# Patient Record
Sex: Male | Born: 1960 | Race: Black or African American | Hispanic: No | Marital: Married | Smoking: Former smoker
Health system: Southern US, Community
[De-identification: ages and names within clinical notes are randomized; demographics above are authoritative.]

## PROBLEM LIST (undated history)

## (undated) DIAGNOSIS — K219 Gastro-esophageal reflux disease without esophagitis: Secondary | ICD-10-CM

## (undated) HISTORY — PX: LEG SURGERY: SHX1003

---

## 1998-05-22 ENCOUNTER — Ambulatory Visit (HOSPITAL_COMMUNITY): Admission: RE | Admit: 1998-05-22 | Discharge: 1998-05-22 | Payer: Self-pay

## 2020-01-03 ENCOUNTER — Other Ambulatory Visit: Payer: Self-pay

## 2020-01-03 ENCOUNTER — Encounter (HOSPITAL_COMMUNITY): Payer: Self-pay | Admitting: Emergency Medicine

## 2020-01-03 ENCOUNTER — Emergency Department (HOSPITAL_COMMUNITY)
Admission: EM | Admit: 2020-01-03 | Discharge: 2020-01-03 | Disposition: A | Discharge status: Home / Self Care | Payer: BC Managed Care – PPO | Attending: Emergency Medicine | Admitting: Emergency Medicine

## 2020-01-03 DIAGNOSIS — K4091 Unilateral inguinal hernia, without obstruction or gangrene, recurrent: Secondary | ICD-10-CM | POA: Diagnosis not present

## 2020-01-03 DIAGNOSIS — K4090 Unilateral inguinal hernia, without obstruction or gangrene, not specified as recurrent: Secondary | ICD-10-CM

## 2020-01-03 DIAGNOSIS — F17210 Nicotine dependence, cigarettes, uncomplicated: Secondary | ICD-10-CM | POA: Diagnosis not present

## 2020-01-03 LAB — COMPREHENSIVE METABOLIC PANEL
ALT: 14 U/L (ref 0–44)
AST: 19 U/L (ref 15–41)
Albumin: 3.8 g/dL (ref 3.5–5.0)
Alkaline Phosphatase: 46 U/L (ref 38–126)
Anion gap: 12 (ref 5–15)
BUN: 13 mg/dL (ref 6–20)
CO2: 22 mmol/L (ref 22–32)
Calcium: 9.4 mg/dL (ref 8.9–10.3)
Chloride: 106 mmol/L (ref 98–111)
Creatinine, Ser: 1.02 mg/dL (ref 0.61–1.24)
GFR calc Af Amer: >60 mL/min (ref >60)
GFR calc non Af Amer: >60 mL/min (ref >60)
Glucose, Bld: 74 mg/dL (ref 70–99)
Potassium: 4 mmol/L (ref 3.5–5.1)
Sodium: 140 mmol/L (ref 135–145)
Total Bilirubin: 1.2 mg/dL (ref 0.3–1.2)
Total Protein: 7.2 g/dL (ref 6.5–8.1)

## 2020-01-03 LAB — CBC WITH DIFFERENTIAL/PLATELET
Abs Immature Granulocytes: 0.01 10*3/uL (ref 0.00–0.07)
Basophils Absolute: 0 10*3/uL (ref 0.0–0.1)
Basophils Relative: 0 %
Eosinophils Absolute: 0 10*3/uL (ref 0.0–0.5)
Eosinophils Relative: 0 %
HCT: 49.7 % (ref 39.0–52.0)
Hemoglobin: 16.4 g/dL (ref 13.0–17.0)
Immature Granulocytes: 0 %
Lymphocytes Relative: 21 %
Lymphs Abs: 1.4 10*3/uL (ref 0.7–4.0)
MCH: 30.9 pg (ref 26.0–34.0)
MCHC: 33 g/dL (ref 30.0–36.0)
MCV: 93.6 fL (ref 80.0–100.0)
Monocytes Absolute: 0.5 10*3/uL (ref 0.1–1.0)
Monocytes Relative: 7 %
Neutro Abs: 4.6 10*3/uL (ref 1.7–7.7)
Neutrophils Relative %: 72 %
Platelets: 340 10*3/uL (ref 150–400)
RBC: 5.31 MIL/uL (ref 4.22–5.81)
RDW: 12.8 % (ref 11.5–15.5)
WBC: 6.5 10*3/uL (ref 4.0–10.5)
nRBC: 0 % (ref 0.0–0.2)

## 2020-01-03 LAB — LACTIC ACID, PLASMA: Lactic Acid, Venous: 1 mmol/L (ref 0.5–1.9)

## 2020-01-03 NOTE — Discharge Instructions (Signed)
Your laboratory results are within normal limits.  I have provided the number to [facility], you will need to schedule an appointment with them in order to obtain further management of your right inguinal hernia.

## 2020-01-03 NOTE — ED Triage Notes (Signed)
Patient c/o hernia to right groin that began protruding sometime today. States he has had hernia for about a year and at night when he lays down it goes flat. Today pain is causing him to not want to eat.

## 2020-01-03 NOTE — ED Provider Notes (Signed)
[facility] EMERGENCY DEPARTMENT Provider Note   CSN: [id number] Arrival date & time: 01/03/20  1741     History Chief Complaint  Patient presents with  . Hernia    Joshua Harmon is a 59 y.o. male.  59 y.o male with a PMH of Inguinal hernia presents to the ED with a chief complaint of right inguinal hernia for the past year.  Patient reports he has had a hernia present for the past year, states he felt like tonight he needed to "get this taken care of ".  He describes a fullness to the right lower 10 region, states this is usually flat during the day but does come swollen throughout the day.  He reports the hernia is mostly absent in the mornings.  He has not gone this evaluated.  He also reports some fullness to his lower abdomen. He has been drinking and eating adequally. He denies any nausea, vomiting, fevers or other complaints.    The history is provided by the patient.       History reviewed. No pertinent past medical history.  There are no problems to display for this patient.   History reviewed. No pertinent surgical history.     No family history on file.  Social History   Tobacco Use  . Smoking status: Current Every Day Smoker  . Smokeless tobacco: Never Used  Substance Use Topics  . Alcohol use: Yes  . Drug use: Yes    Types: Marijuana    Home Medications Prior to Admission medications   Not on File    Allergies    Patient has no known allergies.  Review of Systems   Review of Systems  Constitutional: Negative for chills and fever.  HENT: Negative for ear pain and sore throat.   Eyes: Negative for pain and visual disturbance.  Respiratory: Negative for cough and shortness of breath.   Cardiovascular: Negative for chest pain and palpitations.  Gastrointestinal: Negative for abdominal pain and vomiting.  Genitourinary: Negative for dysuria and hematuria.  Musculoskeletal: Negative for arthralgias and back pain.  Skin:  Negative for color change and rash.  Neurological: Negative for seizures and syncope.  All other systems reviewed and are negative.   Physical Exam Updated Vital Signs BP (!) 123/91 (BP Location: Right Arm)   Pulse 80   Temp 98.7 F (37.1 C) (Oral)   Resp 16   SpO2 98%   Physical Exam Vitals and nursing note reviewed.  Constitutional:      Appearance: He is well-developed. He is not ill-appearing.  HENT:     Head: Normocephalic and atraumatic.  Eyes:     General: No scleral icterus.    Pupils: Pupils are equal, round, and reactive to light.  Cardiovascular:     Heart sounds: Normal heart sounds.  Pulmonary:     Effort: Pulmonary effort is normal.     Breath sounds: Normal breath sounds. No wheezing.  Chest:     Chest wall: No tenderness.  Abdominal:     General: Bowel sounds are normal. There is no distension.     Palpations: Abdomen is soft.     Tenderness: There is no abdominal tenderness.     Hernia: A hernia is present. Hernia is present in the right inguinal area.     Comments: Non incarcerated, non tender, reducible hernia noted. Abdomen is soft, non tender to palpation.   Musculoskeletal:        General: No tenderness or deformity.  Cervical back: Normal range of motion.  Skin:    General: Skin is warm and dry.  Neurological:     Mental Status: He is alert and oriented to person, place, and time.     ED Results / Procedures / Treatments   Labs (all labs ordered are listed, but only abnormal results are displayed) Labs Reviewed  CBC WITH DIFFERENTIAL/PLATELET  COMPREHENSIVE METABOLIC PANEL  LACTIC ACID, PLASMA    EKG None  Radiology No results found.  Procedures Procedures (including critical care time)  Medications Ordered in ED Medications - No data to display  ED Course  I have reviewed the triage vital signs and the nursing notes.  Pertinent labs & imaging results that were available during my care of the patient were reviewed by me  and considered in my medical decision making (see chart for details).    MDM Rules/Calculators/A&P   With a past medical history of right inguinal hernia presents to the ED with complaints of right inguinal hernia discomfort.  He has had this hernia present for about a year.  He has had no fevers, nausea, vomiting, drinking and eating adequately.  Vitals are within normal limits, he is afebrile.  He reports he felt that "today was a day to have this taken care of ".  He has had no increase in pain, is very well-appearing on my exam, no signs of infection.   Evaluation large reducible nonincarcerated nonstrangulated hernia noted on my exam, patient was placed in Trendelenburg, ice was placed to the area, I was able to reduce this hernia with success.  He does report in the morning, hernia seems to be somewhat flat, this seems to increase in swelling at night.  I have obtained labs, these were interpreted by me, CMP without any electrolyte abnormality, CBC without any leukocytosis.  Lactic acid is negative.  No signs of ischemia.  Will bypass imaging at this time.  I have discussed this case with my attending Dr. Francia Greaves who has also seen and evaluated patient and agrees with management.  Patient will be provided with referral to [facility] surgery in order to obtain outpatient treatment.  Patient understands and agrees with management.  Return precautions discussed at length.    Portions of this note were generated with Lobbyist. Dictation errors may occur despite best attempts at proofreading.  Final Clinical Impression(s) / ED Diagnoses Final diagnoses:  Reducible inguinal hernia    Rx / DC Orders ED Discharge Orders    None       Janeece Fitting, Hershal Coria 01/03/20 2350    Valarie Merino, MD 01/04/20 1355

## 2020-01-04 ENCOUNTER — Ambulatory Visit: Payer: Self-pay | Admitting: Surgery

## 2020-01-04 NOTE — H&P (Signed)
Surgical H&P  Chief Complaint: hernia  HPI: 59 year old man presents with inguinal hernia. This has been present for about a year. He presented to the emergency room yesterday noting more difficulty in reducing it, and they were able to do so. He experiences fullness in the right groin which worsens throughout the day, typically in the mornings there is no swelling present. Denies any GI symptoms or bladder symptoms. No prior surgeries for this.  Denies any prior surgeries or medical problems. Takes no medications. Does not really go to doctors.  Had a CMP and CBC in the emergency room yesterday which were all normal. Current every day smoker.  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  No Known Allergies  No past medical history on file.  No past surgical history on file.  No family history on file.  Social History   Socioeconomic History  . Marital status: Single    Spouse name: Not on file  . Number of children: Not on file  . Years of education: Not on file  . Highest education level: Not on file  Occupational History  . Not on file  Tobacco Use  . Smoking status: Current Every Day Smoker  . Smokeless tobacco: Never Used  Substance and Sexual Activity  . Alcohol use: Yes  . Drug use: Yes    Types: Marijuana  . Sexual activity: Not on file  Other Topics Concern  . Not on file  Social History Narrative  . Not on file   Social Determinants of Health   Financial Resource Strain:   . Difficulty of Paying Living Expenses: Not on file  Food Insecurity:   . Worried About Programme researcher, broadcasting/film/video in the Last Year: Not on file  . Ran Out of Food in the Last Year: Not on file  Transportation Needs:   . Lack of Transportation (Medical): Not on file  . Lack of Transportation (Non-Medical): Not on file  Physical Activity:   . Days of Exercise per Week: Not on file  . Minutes of Exercise per Session: Not on file  Stress:   . Feeling of Stress  : Not on file  Social Connections:   . Frequency of Communication with Friends and Family: Not on file  . Frequency of Social Gatherings with Friends and Family: Not on file  . Attends Religious Services: Not on file  . Active Member of Clubs or Organizations: Not on file  . Attends Banker Meetings: Not on file  . Marital Status: Not on file    No current outpatient medications on file prior to visit.   No current facility-administered medications on file prior to visit.    Review of Systems: a complete, 10pt review of systems was completed with pertinent positives and negatives as documented in the HPI  Physical Exam: Vitals  Weight: 143.25 lb Height: 73in Body Surface Area: 1.87 m Body Mass Index: 18.9 kg/m  Temp.: 97.36F  Pulse: 120 (Regular)  P.OX: 95% (Room air) BP: 124/82 (Sitting, Left Arm, Standard)  Alert, well-appearing Unlabored respirations Abdomen soft nontender. Reducible large right inguinal hernia. No hernia in the left.   CBC Latest Ref Rng & Units 01/03/2020  WBC 4.0 - 10.5 K/uL 6.5  Hemoglobin 13.0 - 17.0 g/dL 24.2  Hematocrit 35.3 - 52.0 % 49.7  Platelets 150 - 400 K/uL 340    CMP Latest Ref Rng & Units 01/03/2020  Glucose 70 - 99 mg/dL 74  BUN 6 -  20 mg/dL 13  Creatinine 0.61 - 1.24 mg/dL 1.02  Sodium 135 - 145 mmol/L 140  Potassium 3.5 - 5.1 mmol/L 4.0  Chloride 98 - 111 mmol/L 106  CO2 22 - 32 mmol/L 22  Calcium 8.9 - 10.3 mg/dL 9.4  Total Protein 6.5 - 8.1 g/dL 7.2  Total Bilirubin 0.3 - 1.2 mg/dL 1.2  Alkaline Phos 38 - 126 U/L 46  AST 15 - 41 U/L 19  ALT 0 - 44 U/L 14    No results found for: INR, PROTIME  Imaging: No results found.   A/P: INGUINAL HERNIA, RIGHT (K40.90) Story: Reducible but large hernia, increasingly symptomatic with recent episode of transient incarceration. I recommended proceeding with open repair. Discussed the procedure including technical details, risks of bleeding, infection, pain,  scarring, injury to intra-abdominal or retroperitoneal structures including bowel, bladder, blood vessels, nerves etc. Discussed risk of chronic groin pain or numbness, problems with wound healing, seroma/hematoma, discussed use of mesh. Discussed that this requires general anesthesia which carries some risk of cardiovascular, thromboembolic or pulmonary complications and we went over those. Discussed risk of hernia recurrence which is increased by his tobacco abuse. Discussed recommended 6 weeks of no heavy lifting or strenuous exercise to maximize healing and scar tissue formation and reduce risk of recurrence. Questions were welcomed and answered to his satisfaction. We'll proceed with scheduling.  There are no problems to display for this patient.      Romana Juniper, MD [facility], [place]  See AMION to contact appropriate on-call provider

## 2020-01-29 ENCOUNTER — Emergency Department: Payer: BC Managed Care – PPO

## 2020-01-29 ENCOUNTER — Other Ambulatory Visit: Payer: Self-pay

## 2020-01-29 ENCOUNTER — Emergency Department
Admission: EM | Admit: 2020-01-29 | Discharge: 2020-01-29 | Disposition: A | Discharge status: Home / Self Care | Payer: BC Managed Care – PPO | Attending: Student in an Organized Health Care Education/Training Program | Admitting: Student in an Organized Health Care Education/Training Program

## 2020-01-29 ENCOUNTER — Encounter: Payer: Self-pay | Admitting: Emergency Medicine

## 2020-01-29 DIAGNOSIS — N5089 Other specified disorders of the male genital organs: Secondary | ICD-10-CM

## 2020-01-29 DIAGNOSIS — R1909 Other intra-abdominal and pelvic swelling, mass and lump: Secondary | ICD-10-CM | POA: Diagnosis not present

## 2020-01-29 DIAGNOSIS — K4090 Unilateral inguinal hernia, without obstruction or gangrene, not specified as recurrent: Secondary | ICD-10-CM | POA: Diagnosis not present

## 2020-01-29 DIAGNOSIS — R1031 Right lower quadrant pain: Secondary | ICD-10-CM

## 2020-01-29 DIAGNOSIS — F17210 Nicotine dependence, cigarettes, uncomplicated: Secondary | ICD-10-CM | POA: Diagnosis not present

## 2020-01-29 DIAGNOSIS — F1210 Cannabis abuse, uncomplicated: Secondary | ICD-10-CM | POA: Diagnosis not present

## 2020-01-29 LAB — CBC WITH DIFFERENTIAL/PLATELET
Abs Immature Granulocytes: 0.02 10*3/uL (ref 0.00–0.07)
Basophils Absolute: 0 10*3/uL (ref 0.0–0.1)
Basophils Relative: 1 %
Eosinophils Absolute: 0 10*3/uL (ref 0.0–0.5)
Eosinophils Relative: 1 %
HCT: 49.6 % (ref 39.0–52.0)
Hemoglobin: 16.5 g/dL (ref 13.0–17.0)
Immature Granulocytes: 0 %
Lymphocytes Relative: 27 %
Lymphs Abs: 1.8 10*3/uL (ref 0.7–4.0)
MCH: 31.1 pg (ref 26.0–34.0)
MCHC: 33.3 g/dL (ref 30.0–36.0)
MCV: 93.4 fL (ref 80.0–100.0)
Monocytes Absolute: 0.6 10*3/uL (ref 0.1–1.0)
Monocytes Relative: 8 %
Neutro Abs: 4.1 10*3/uL (ref 1.7–7.7)
Neutrophils Relative %: 63 %
Platelets: 300 10*3/uL (ref 150–400)
RBC: 5.31 MIL/uL (ref 4.22–5.81)
RDW: 12.8 % (ref 11.5–15.5)
WBC: 6.5 10*3/uL (ref 4.0–10.5)
nRBC: 0 % (ref 0.0–0.2)

## 2020-01-29 LAB — COMPREHENSIVE METABOLIC PANEL
ALT: 13 U/L (ref 0–44)
AST: 15 U/L (ref 15–41)
Albumin: 4.5 g/dL (ref 3.5–5.0)
Alkaline Phosphatase: 44 U/L (ref 38–126)
Anion gap: 11 (ref 5–15)
BUN: 13 mg/dL (ref 6–20)
CO2: 29 mmol/L (ref 22–32)
Calcium: 9.8 mg/dL (ref 8.9–10.3)
Chloride: 101 mmol/L (ref 98–111)
Creatinine, Ser: 0.96 mg/dL (ref 0.61–1.24)
GFR calc Af Amer: >60 mL/min (ref >60)
GFR calc non Af Amer: >60 mL/min (ref >60)
Glucose, Bld: 87 mg/dL (ref 70–99)
Potassium: 4.5 mmol/L (ref 3.5–5.1)
Sodium: 141 mmol/L (ref 135–145)
Total Bilirubin: 1.5 mg/dL — ABNORMAL HIGH (ref 0.3–1.2)
Total Protein: 8.1 g/dL (ref 6.5–8.1)

## 2020-01-29 IMAGING — CT CT ABD-PELV W/ CM
2 of 5 series · 15 of 46 positions shown, 17 images · IV contrast (omnipaque)
Comparison: None.

CLINICAL DATA: Right-sided hernia for 1 month with pain and
swelling, initial encounter

EXAM:
CT ABDOMEN AND PELVIS WITH CONTRAST
TECHNIQUE: Multidetector CT imaging of the abdomen and pelvis was performed
using the standard protocol following bolus administration of
intravenous contrast.
CONTRAST:  100mL OMNIPAQUE IOHEXOL 300 MG/ML  SOLN

[Series 2: routine abd/pel with · axial · 0.70mm/px · z∈[-615,-190]mm · 12 of 95 slices shown, 14 images]
[im 5/95  soft-tissue]
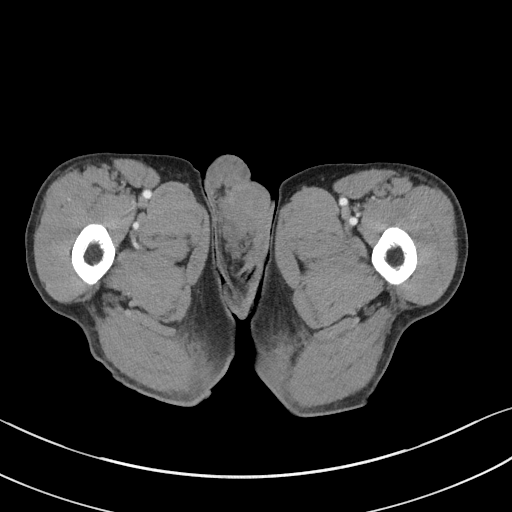
[im 5/95  bone]
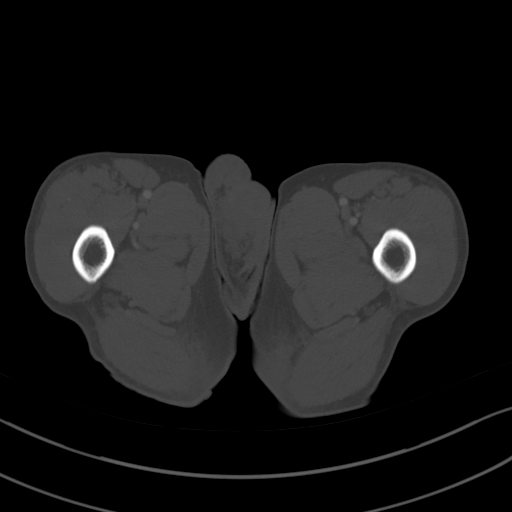
[im 15/95  soft-tissue]
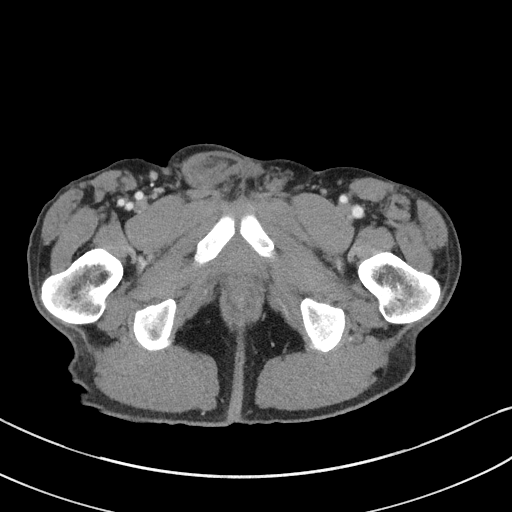
[im 19/95  soft-tissue]
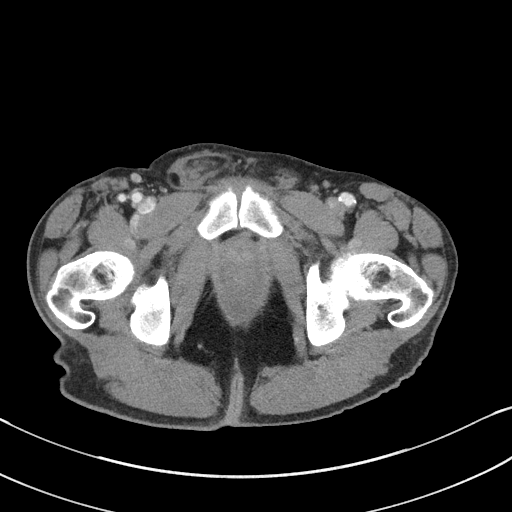
[im 29/95  soft-tissue]
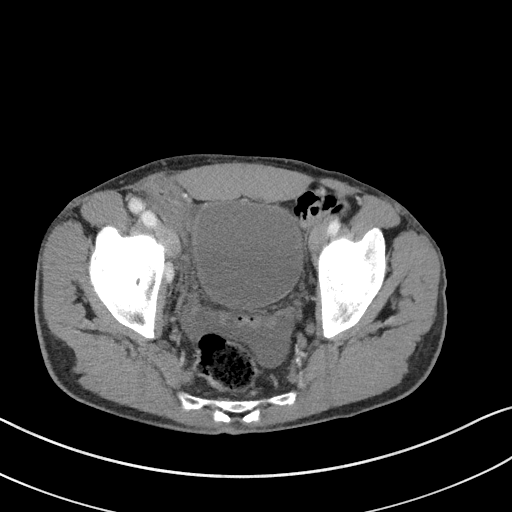
[im 38/95  soft-tissue]
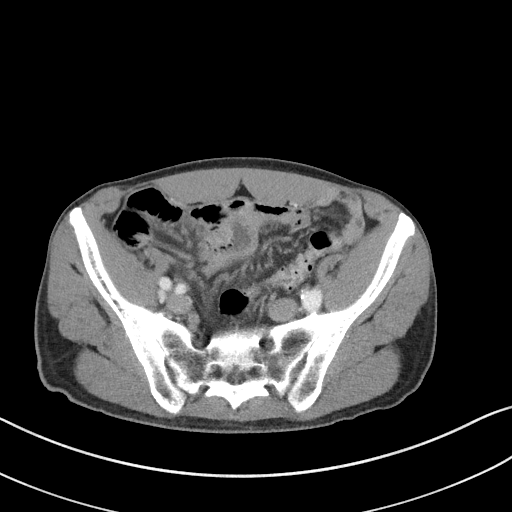
[im 43/95  soft-tissue]
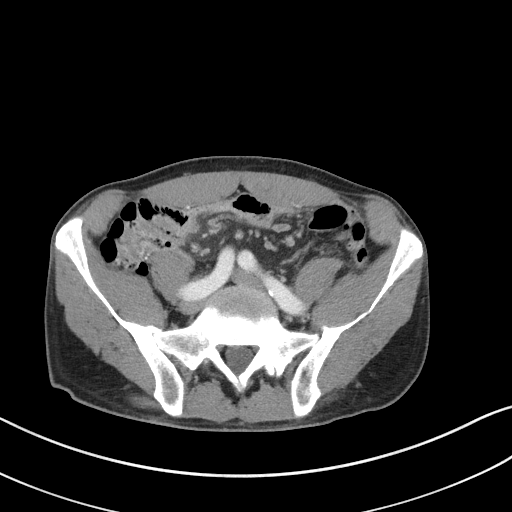
[im 52/95  soft-tissue]
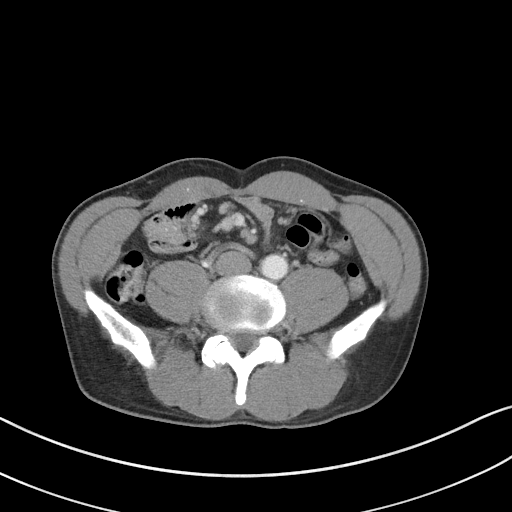
[im 57/95  soft-tissue]
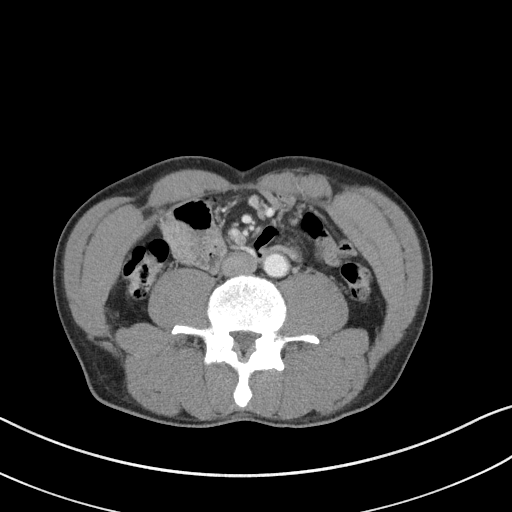
[im 66/95  soft-tissue]
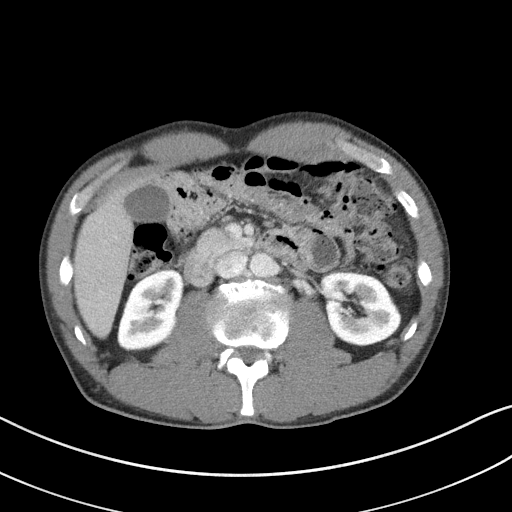
[im 66/95  bone]
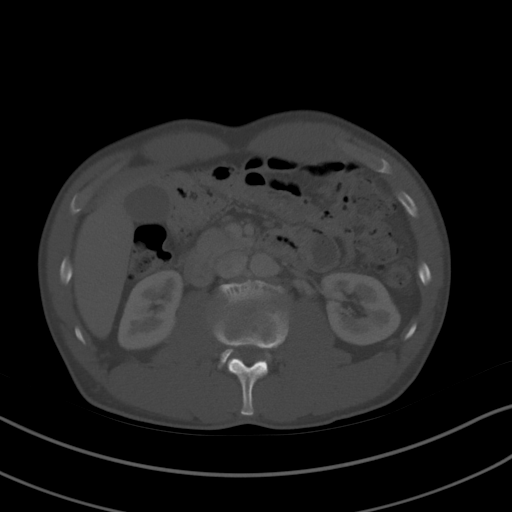
[im 76/95  soft-tissue]
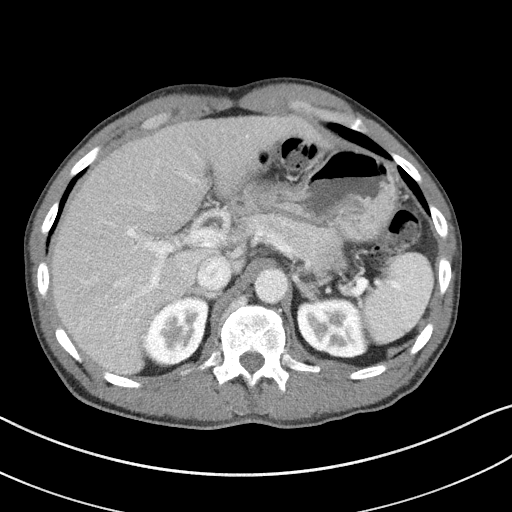
[im 80/95  soft-tissue]
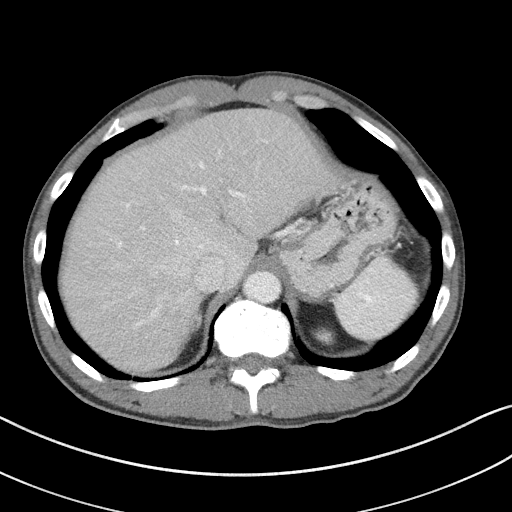
[im 90/95  soft-tissue]
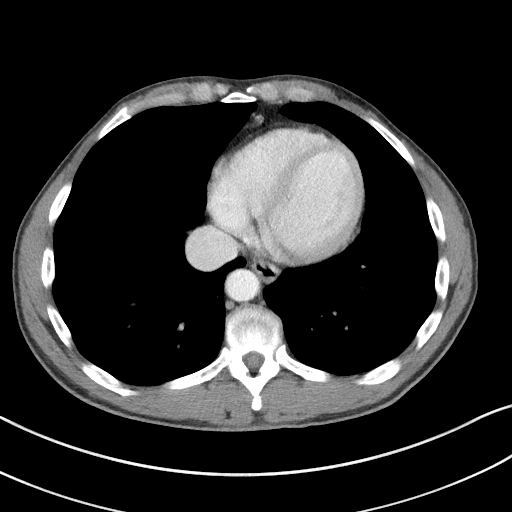

[Series 5: coronal st · coronal · 0.65mm/px · 3 of 81 slices shown]
[im 27/81  soft-tissue]
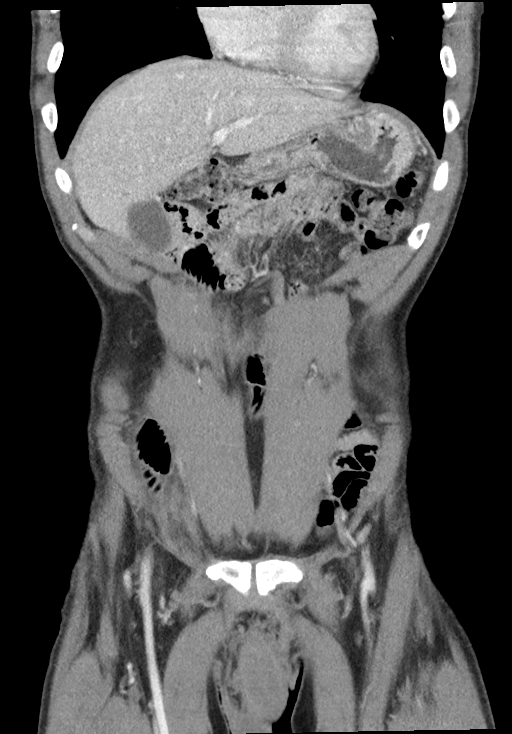
[im 36/81  soft-tissue]
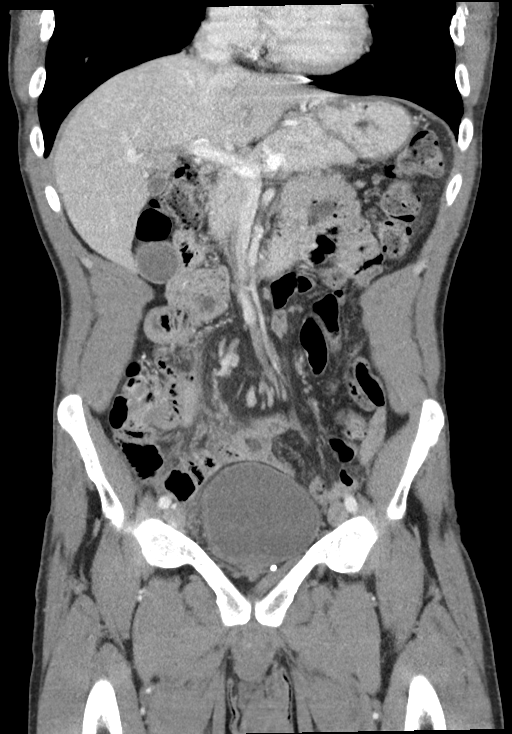
[im 45/81  soft-tissue]
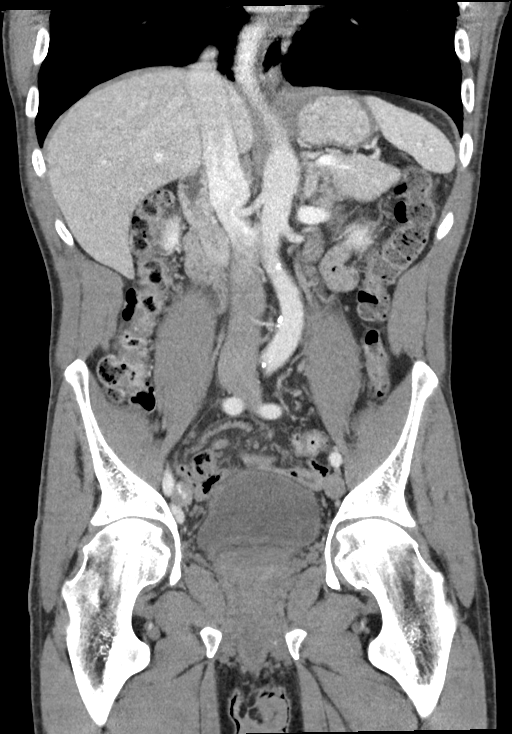

[15 of 46 positions shown; findings below may reference images not displayed]

FINDINGS: Lower chest: No acute abnormality.

Hepatobiliary: No focal liver abnormality is seen. No gallstones,
gallbladder wall thickening, or biliary dilatation.

Pancreas: Unremarkable. No pancreatic ductal dilatation or
surrounding inflammatory changes.

Spleen: Normal in size without focal abnormality.

Adrenals/Urinary Tract: Adrenal glands are within normal limits.
Kidneys demonstrate a normal enhancement pattern. Delayed images
demonstrate normal excretion of contrast material bilaterally. No
obstructive changes are seen. The bladder is well distended.

Stomach/Bowel: No obstructive or inflammatory changes of the colon
are noted. The appendix is well visualized and within normal limits.
Small bowel is unremarkable. Stomach is decompressed.

Vascular/Lymphatic: Aortic atherosclerosis. No enlarged abdominal or
pelvic lymph nodes.

Reproductive: Prostate is unremarkable.

Other: Right inguinal hernia is noted containing primarily fat with
some engorgement of the vasculature and herniated fluid. No bowel
loops are noted within. No free fluid is seen.

Musculoskeletal: No acute or significant osseous findings.
IMPRESSION: Right inguinal hernia containing primarily fat and vasculature with
some engorgement of the vasculature. No bowel loops are noted
within. This is consistent with the patient's given clinical
history.

No other focal abnormality is noted.

## 2020-01-29 MED ORDER — IOHEXOL 300 MG/ML  SOLN
100.0000 mL | Freq: Once | INTRAMUSCULAR | Status: AC | PRN
  Administered 2020-01-29: 100 mL via INTRAVENOUS

## 2020-01-29 MED ORDER — HYDROCODONE-ACETAMINOPHEN 5-325 MG PO TABS: 1.0000 | ORAL_TABLET | ORAL | 0 refills | Status: DC | PRN

## 2020-01-29 MED ORDER — HYDROCODONE-ACETAMINOPHEN 5-325 MG PO TABS
1.0000 | ORAL_TABLET | Freq: Once | ORAL | Status: DC
  Filled 2020-01-29: qty 1

## 2020-01-29 MED ORDER — POLYETHYLENE GLYCOL 3350 17 G PO PACK: 17.0000 g | PACK | Freq: Every day | ORAL | 0 refills | Status: DC

## 2020-01-29 NOTE — Discharge Instructions (Signed)

## 2020-01-29 NOTE — ED Triage Notes (Addendum)
Pt in via POV, reports right side inguinal hernia x approximately one month, states swelling, protrusion at night after working.  No protrusion noted at this time.

## 2020-01-29 NOTE — ED Notes (Signed)
AAOx3.  Skin warm and dry.  NAD 

## 2020-01-29 NOTE — ED Provider Notes (Signed)
[facility] Emergency Department Provider Note    First MD Initiated Contact with Patient 01/29/20 1227     (approximate)  I have reviewed the triage vital signs and the nursing notes.   HISTORY  Chief Complaint Inguinal Hernia    HPI Joshua Harmon is a 59 y.o. male presents the ER for several weeks of intermittent right groin swelling and pain.  States is worse at the end of the day after he is doing heavy lifting.  Pain shoots and radiates down his testicle.  Has not seen a PCP or surgeon for this discomfort.  Denies any fevers.  He still moving his bowels.  States the pain is moderate to severe.  Currently his pain is mild.  States he typically feels like he can push it back in.    History reviewed. No pertinent past medical history. No family history on file. History reviewed. No pertinent surgical history. There are no problems to display for this patient.     Prior to Admission medications   Not on File    Allergies Patient has no known allergies.    Social History Social History   Tobacco Use  . Smoking status: Current Every Day Smoker    Packs/day: 0.25    Types: Cigarettes  . Smokeless tobacco: Never Used  Substance Use Topics  . Alcohol use: Yes  . Drug use: Yes    Types: Marijuana    Review of Systems Patient denies headaches, rhinorrhea, blurry vision, numbness, shortness of breath, chest pain, edema, cough, abdominal pain, nausea, vomiting, diarrhea, dysuria, fevers, rashes or hallucinations unless otherwise stated above in HPI. ____________________________________________   PHYSICAL EXAM:  VITAL SIGNS: Vitals:   01/29/20 1050  BP: (!) 144/98  Pulse: 89  Resp: 18  Temp: 98.2 F (36.8 C)  SpO2: 100%    Constitutional: Alert and oriented.  Eyes: Conjunctivae are normal.  Head: Atraumatic. Nose: No congestion/rhinnorhea. Mouth/Throat: Mucous membranes are moist.   Neck: No stridor. Painless ROM.    Cardiovascular: Normal rate, regular rhythm. Grossly normal heart sounds.  Good peripheral circulation. Respiratory: Normal respiratory effort.  No retractions. Lungs CTAB. Gastrointestinal: Soft and nontender. No distention. No abdominal bruits. No CVA tenderness. Genitourinary: Soft swelling to the right inguinal canal and scrotum.  No testicular pain or mass.  Cremasteric reflexes present.  Swelling is reducible and worsen with Valsalva. Musculoskeletal: No lower extremity tenderness nor edema.  No joint effusions. Neurologic:  Normal speech and language. No gross focal neurologic deficits are appreciated. No facial droop Skin:  Skin is warm, dry and intact. No rash noted. Psychiatric: Mood and affect are normal. Speech and behavior are normal.  ____________________________________________   LABS (all labs ordered are listed, but only abnormal results are displayed)  Results for orders placed or performed during the hospital encounter of 01/29/20 (from the past 24 hour(s))  CBC with Differential/Platelet     Status: None   Collection Time: 01/29/20  2:18 PM  Result Value Ref Range   WBC 6.5 4.0 - 10.5 K/uL   RBC 5.31 4.22 - 5.81 MIL/uL   Hemoglobin 16.5 13.0 - 17.0 g/dL   HCT 40.9 81.1 - 91.4 %   MCV 93.4 80.0 - 100.0 fL   MCH 31.1 26.0 - 34.0 pg   MCHC 33.3 30.0 - 36.0 g/dL   RDW 78.2 95.6 - 21.3 %   Platelets 300 150 - 400 K/uL   nRBC 0.0 0.0 - 0.2 %   Neutrophils Relative %  63 %   Neutro Abs 4.1 1.7 - 7.7 K/uL   Lymphocytes Relative 27 %   Lymphs Abs 1.8 0.7 - 4.0 K/uL   Monocytes Relative 8 %   Monocytes Absolute 0.6 0.1 - 1.0 K/uL   Eosinophils Relative 1 %   Eosinophils Absolute 0.0 0.0 - 0.5 K/uL   Basophils Relative 1 %   Basophils Absolute 0.0 0.0 - 0.1 K/uL   Immature Granulocytes 0 %   Abs Immature Granulocytes 0.02 0.00 - 0.07 K/uL  Comprehensive metabolic panel     Status: Abnormal   Collection Time: 01/29/20  2:18 PM  Result Value Ref Range   Sodium 141  135 - 145 mmol/L   Potassium 4.5 3.5 - 5.1 mmol/L   Chloride 101 98 - 111 mmol/L   CO2 29 22 - 32 mmol/L   Glucose, Bld 87 70 - 99 mg/dL   BUN 13 6 - 20 mg/dL   Creatinine, Ser 3.41 0.61 - 1.24 mg/dL   Calcium 9.8 8.9 - 93.7 mg/dL   Total Protein 8.1 6.5 - 8.1 g/dL   Albumin 4.5 3.5 - 5.0 g/dL   AST 15 15 - 41 U/L   ALT 13 0 - 44 U/L   Alkaline Phosphatase 44 38 - 126 U/L   Total Bilirubin 1.5 (H) 0.3 - 1.2 mg/dL   GFR calc non Af Amer >60 >60 mL/min   GFR calc Af Amer >60 >60 mL/min   Anion gap 11 5 - 15   ____________________________________________ ____________________________________________  RADIOLOGY  I personally reviewed all radiographic images ordered to evaluate for the above acute complaints and reviewed radiology reports and findings.  These findings were personally discussed with the patient.  Please see medical record for radiology report.  ____________________________________________   PROCEDURES  Procedure(s) performed:  Procedures    Critical Care performed: no ____________________________________________   INITIAL IMPRESSION / ASSESSMENT AND PLAN / ED COURSE  Pertinent labs & imaging results that were available during my care of the patient were reviewed by me and considered in my medical decision making (see chart for details).   DDX: hernia, incarcerated hernia, lymphadenopathy, varicocele, mass  Joshua Harmon is a 59 y.o. who presents to the ED with symptoms as described above.  Patient well-appearing in no acute distress.  His abdominal exam is benign but does have this reducible soft area in the right groin area.  Ultrasound imaging will be ordered to further evaluate.  Clinical Course as of Jan 28 1641  Tue Jan 29, 2020  1605 Hernia was successfully reduced.  Patient feels much improved.  Not consistent with strangulated hernia.  Patient appropriate for outpatient follow-up.   [PR]    Clinical Course User Index [PR] Willy Eddy, MD      The patient was evaluated in Emergency Department today for the symptoms described in the history of present illness. He/she was evaluated in the context of the global COVID-19 pandemic, which necessitated consideration that the patient might be at risk for infection with the SARS-CoV-2 virus that causes COVID-19. Institutional protocols and algorithms that pertain to the evaluation of patients at risk for COVID-19 are in a state of rapid change based on information released by regulatory bodies including the CDC and federal and state organizations. These policies and algorithms were followed during the patient's care in the ED.  As part of my medical decision making, I reviewed the following data within the electronic MEDICAL RECORD NUMBER Nursing notes reviewed and incorporated, Labs reviewed, notes  from prior ED visits and [place] Controlled Substance Database   ____________________________________________   FINAL CLINICAL IMPRESSION(S) / ED DIAGNOSES  Final diagnoses:  Right groin pain  Unilateral inguinal hernia without obstruction or gangrene, recurrence not specified      NEW MEDICATIONS STARTED DURING THIS VISIT:  New Prescriptions   No medications on file     Note:  This document was prepared using Dragon voice recognition software and may include unintentional dictation errors.    Merlyn Lot, MD 01/29/20 [phone]

## 2020-02-04 ENCOUNTER — Encounter: Payer: Self-pay | Admitting: Surgery

## 2020-02-04 ENCOUNTER — Other Ambulatory Visit: Payer: Self-pay

## 2020-02-04 ENCOUNTER — Ambulatory Visit: Payer: BC Managed Care – PPO | Admitting: Surgery

## 2020-02-04 VITALS — BP 138/94 | HR 90 | Temp 97.7°F | Ht 73.0 in | Wt 143.6 lb

## 2020-02-04 DIAGNOSIS — K4090 Unilateral inguinal hernia, without obstruction or gangrene, not specified as recurrent: Secondary | ICD-10-CM | POA: Diagnosis not present

## 2020-02-04 NOTE — H&P (View-Only) (Signed)
Surgical Consultation  02/04/2020  Joshua Harmon is an 59 y.o. male.   Chief Complaint  Patient presents with  . New Patient (Initial Visit)    Right Inguinal Hernia     HPI: 59 year old male seen in consultation at the request of Dr. Roxan Hockey.  He reports couple months of a right inguinal pain and bulge.  He reports that the pain is intermittent moderate intensity and sharp in nature.  Pain is worsening with certain activities and with Valsalva.  He went to the emergency room twice within the last couple months.  I have personally reviewed the CT scan showing evidence of a right inguinal hernia without strangulation.  Also CBC and CMP reviewed and are within normal limits.  The patient reports apparently the only surgery he had is a child with some leg surgery.  He is able to perform more than 4 METS of activity without any shortness of breath or chest pain.  He does smoke daily. He walks without assistance.  PMHx: None significant  Past Surgical History:  Procedure Laterality Date  . LEG SURGERY Bilateral as a child    No pertinent fam hx Social History:  reports that he has been smoking cigarettes. He has been smoking about 0.25 packs per day. He has never used smokeless tobacco. He reports current alcohol use of about 10.0 standard drinks of alcohol per week. He reports previous drug use.  Allergies: No Known Allergies  Medications reviewed.     ROS Full ROS performed and is otherwise negative other than what is stated in the HPI    BP (!) 138/94   Pulse 90   Temp 97.7 F (36.5 C)   Ht 6\' 1"  (1.854 m)   Wt 143 lb 9.6 oz (65.1 kg)   SpO2 95%   BMI 18.95 kg/m   Physical Exam Vitals and nursing note reviewed. Exam conducted with a chaperone present.  Constitutional:      General: He is not in acute distress.    Appearance: Normal appearance. He is normal weight.  Eyes:     General: No scleral icterus.       Right eye: No discharge.        Left eye: No  discharge.  Cardiovascular:     Rate and Rhythm: Normal rate and regular rhythm.     Heart sounds: No murmur.  Pulmonary:     Effort: Pulmonary effort is normal. No respiratory distress.     Breath sounds: Normal breath sounds. No stridor. No wheezing or rhonchi.  Abdominal:     General: Abdomen is flat. There is no distension.     Palpations: Abdomen is soft. There is no mass.     Tenderness: There is no abdominal tenderness. There is no guarding.     Hernia: A hernia is present.     Comments: Evidence of large right inguinal hernia that is reducible.  It is mildly tender to palpation.  Musculoskeletal:        General: No swelling, tenderness or deformity. Normal range of motion.     Cervical back: Normal range of motion and neck supple. No rigidity.  Skin:    General: Skin is warm and dry.     Capillary Refill: Capillary refill takes less than 2 seconds.  Neurological:     General: No focal deficit present.     Mental Status: He is alert and oriented to person, place, and time.  Psychiatric:        Mood  and Affect: Mood normal.        Behavior: Behavior normal.        Thought Content: Thought content normal.        Judgment: Judgment normal.    Assessment/Plan:  59 yo male with symptomatic right inguinal hernia.  Discussed with the patient in detail about his disease process and about the reasoning for repair.  I do think that he is a good candidate for a robotic approach.  Discussed with patient detail about the procedure.  Risks, benefits and possible complications including but not limited to: Bleeding, chronic pain, recurrence, infection bowel injury.  He understands and wishes to proceed. Also discussed about postoperative expectations and restrictions of lifting.  He also understands that if we in fact find bilateral defects we will go ahead and fix at the same time. There are no diagnoses linked to this encounter. A copy of this report was sent to the referring  provider  Caroleen Hamman, MD [facility] General Surgeon

## 2020-02-04 NOTE — Progress Notes (Signed)
Surgical Consultation  02/04/2020  Joshua Harmon is an 59 y.o. male.   Chief Complaint  Patient presents with  . New Patient (Initial Visit)    Right Inguinal Hernia     HPI: 59 year old male seen in consultation at the request of Dr. Roxan Hockey.  He reports couple months of a right inguinal pain and bulge.  He reports that the pain is intermittent moderate intensity and sharp in nature.  Pain is worsening with certain activities and with Valsalva.  He went to the emergency room twice within the last couple months.  I have personally reviewed the CT scan showing evidence of a right inguinal hernia without strangulation.  Also CBC and CMP reviewed and are within normal limits.  The patient reports apparently the only surgery he had is a child with some leg surgery.  He is able to perform more than 4 METS of activity without any shortness of breath or chest pain.  He does smoke daily. He walks without assistance.  PMHx: None significant  Past Surgical History:  Procedure Laterality Date  . LEG SURGERY Bilateral as a child    No pertinent fam hx Social History:  reports that he has been smoking cigarettes. He has been smoking about 0.25 packs per day. He has never used smokeless tobacco. He reports current alcohol use of about 10.0 standard drinks of alcohol per week. He reports previous drug use.  Allergies: No Known Allergies  Medications reviewed.     ROS Full ROS performed and is otherwise negative other than what is stated in the HPI    BP (!) 138/94   Pulse 90   Temp 97.7 F (36.5 C)   Ht 6\' 1"  (1.854 m)   Wt 143 lb 9.6 oz (65.1 kg)   SpO2 95%   BMI 18.95 kg/m   Physical Exam Vitals and nursing note reviewed. Exam conducted with a chaperone present.  Constitutional:      General: He is not in acute distress.    Appearance: Normal appearance. He is normal weight.  Eyes:     General: No scleral icterus.       Right eye: No discharge.        Left eye: No  discharge.  Cardiovascular:     Rate and Rhythm: Normal rate and regular rhythm.     Heart sounds: No murmur.  Pulmonary:     Effort: Pulmonary effort is normal. No respiratory distress.     Breath sounds: Normal breath sounds. No stridor. No wheezing or rhonchi.  Abdominal:     General: Abdomen is flat. There is no distension.     Palpations: Abdomen is soft. There is no mass.     Tenderness: There is no abdominal tenderness. There is no guarding.     Hernia: A hernia is present.     Comments: Evidence of large right inguinal hernia that is reducible.  It is mildly tender to palpation.  Musculoskeletal:        General: No swelling, tenderness or deformity. Normal range of motion.     Cervical back: Normal range of motion and neck supple. No rigidity.  Skin:    General: Skin is warm and dry.     Capillary Refill: Capillary refill takes less than 2 seconds.  Neurological:     General: No focal deficit present.     Mental Status: He is alert and oriented to person, place, and time.  Psychiatric:        Mood  and Affect: Mood normal.        Behavior: Behavior normal.        Thought Content: Thought content normal.        Judgment: Judgment normal.    Assessment/Plan:  58 yo male with symptomatic right inguinal hernia.  Discussed with the patient in detail about his disease process and about the reasoning for repair.  I do think that he is a good candidate for a robotic approach.  Discussed with patient detail about the procedure.  Risks, benefits and possible complications including but not limited to: Bleeding, chronic pain, recurrence, infection bowel injury.  He understands and wishes to proceed. Also discussed about postoperative expectations and restrictions of lifting.  He also understands that if we in fact find bilateral defects we will go ahead and fix at the same time. There are no diagnoses linked to this encounter. A copy of this report was sent to the referring  provider  Braddock Servellon, MD FACS General Surgeon  

## 2020-02-04 NOTE — Patient Instructions (Addendum)
You have chose to have your hernia repaired. This will be done by Dr. Everlene Farrier at [facility].  Please see your (blue) Pre-care information that you have been given today. Our surgery schedule will call you to look at surgery dates and go over information for Covid-19 testing prior to surgery.   You will need to arrange to be out of work for 2 weeks and then return with a lifting restrictions of no more than  20 pounds for 4 more weeks. Please send any FMLA paperwork prior to surgery and we will fill this out and fax it back to your employer within 3 business days.  You may have a bruise in your groin and also swelling and brusing in your testicle area. You may use ice 4-5 times daily for 15-20 minutes each time. Make sure that you place a barrier between you and the ice pack. To decrease the swelling, you may roll up a bath towel and place it vertically in between your thighs with your testicles resting on the towel. You will want to keep this area elevated as much as possible for several days following surgery.     Laparoscopic Inguinal Hernia Repair, Adult  Laparoscopic inguinal hernia repair is a surgical procedure to repair a small weak spot in the groin muscles that allows fat or intestine from inside the abdomen to bulge out (inguinal hernia). This procedure may be planned, or it may be an emergency procedure. During the procedure, tissue that has bulged out is moved back into place, and the opening in the groin muscles is repaired. This is done through three small incisions in the abdomen. A thin tube with a light and camera on the end (laparoscope) will be used to help perform the procedure. Tell a health care provider about:  Any allergies you have.  All medicines you are taking, including vitamins, herbs, eye drops, creams, and over-the-counter medicines.  Any problems you or family members have had with anesthetic medicines.  Any blood disorders you have.  Any surgeries you have  had.  Any medical conditions you have.  Whether you are pregnant or may be pregnant. What are the risks? Generally, this is a safe procedure. However, problems may occur, including:  Infection.  Bleeding.  Allergic reactions to medicines.  Damage to other structures or organs.  Long-term pain and swelling of the scrotum, in men.  Testicle damage in men.  Inability to completely empty the bladder (urinary retention).  Blood clots.  A collection of fluid that builds up under the skin (seroma).  The hernia coming back (recurrence). What happens before the procedure? Medicines  Ask your health care provider about: ? Changing or stopping your regular medicines. This is especially important if you are taking diabetes medicines or blood thinners. ? Taking over-the-counter medicines, vitamins, herbs, and supplements. ? Taking medicines such as aspirin and ibuprofen. These medicines can thin your blood. Do not take these medicines unless your health care provider tells you to take them.  You may be given antibiotic medicine to help prevent an infection. Staying hydrated Follow instructions from your health care provider about hydration, which may include:  Up to 2 hours before the procedure - you may continue to drink clear liquids, such as water, clear fruit juice, black coffee, and plain tea. Eating and drinking restrictions Follow instructions from your health care provider about eating and drinking, which may include:  8 hours before the procedure - stop eating heavy meals or foods such as meat,  fried foods, or fatty foods.  6 hours before the procedure - stop eating light meals or foods, such as toast or cereal.  6 hours before the procedure - stop drinking milk or drinks that contain milk.  2 hours before the procedure - stop drinking clear liquids. General instructions  Do not use any products that contain nicotine or tobacco, such as cigarettes and e-cigarettes. If  you need help quitting, ask your health care provider.  You may be asked to shower with a germ-killing soap.  Plan to have someone take you home from the hospital or clinic.  Plan to have a responsible adult care for you for at least 24 hours after you leave the hospital or clinic. This is important. What happens during the procedure?  To lower your risk of infection: ? Your health care team will wash or sanitize their hands. ? Hair may be removed from the surgical area. ? Your skin will be washed with soap.  An IV will be inserted into one of your veins.  You will be given one or more of the following: ? A medicine to help you relax (sedative). ? A medicine to make you fall asleep (general anesthetic).  Three small incisions will be made in your abdomen.  Your abdomen will be inflated with carbon dioxide gas to make the surgical area easier to see.  A laparoscope and surgical instruments will be inserted through the incisions. The laparoscope will send images of the inside of your abdomen to a monitor in the room.  Tissue that is bulging through the hernia may be removed or moved back into its normal place.  The hernia opening will be closed with a sheet of surgical mesh.  The surgical instruments and laparoscope will be removed.  Your incisions will be closed with stitches (sutures) and adhesive strips.  A bandage (dressing) will be placed over your incisions. The procedure may vary among health care providers and hospitals. What happens after the procedure?  Your blood pressure, heart rate, breathing rate, and blood oxygen level will be monitored until the medicines you were given have worn off.  You will be given pain medicine as needed.  You may continue to receive medicines and fluids through an IV. The IV will be removed after you can drink fluids well.  You will be encouraged to get up and move around, and to take deep breaths frequently.  Do not drive for 24  hours if you were given a sedative during your procedure. Summary  Laparoscopic inguinal hernia repair is a surgical procedure to repair a small weak spot in the groin muscles that allows fat or intestine from inside the abdomen to bulge out (inguinal hernia).  This procedure is done through three small incisions in the abdomen. A thin tube with a light and camera on the end (laparoscope) will be used to help perform the procedure.  After the procedure, you will be encouraged to get up and move around, and to take deep breaths frequently. This information is not intended to replace advice given to you by your health care provider. Make sure you discuss any questions you have with your health care provider. Document Revised: 04/18/2019 Document Reviewed: 02/17/2017 Elsevier Patient Education  [employer].

## 2020-02-06 ENCOUNTER — Telehealth: Payer: Self-pay | Admitting: Surgery

## 2020-02-06 NOTE — Telephone Encounter (Signed)
Pt has been advised of pre admission date/time, Covid Testing date and Surgery date.  Surgery Date: 02/19/20 Preadmission Testing Date: 02/11/20 (8a-1p) Covid Testing Date: 02/15/20 - patient advised to go to [facility] ([address] [facility])  Patient has been made aware to call [phone], between 1-3:00pm the day before surgery, to find out what time to arrive.

## 2020-02-11 ENCOUNTER — Encounter
Admission: RE | Admit: 2020-02-11 | Discharge: 2020-02-11 | Disposition: A | Discharge status: Home / Self Care | Payer: BC Managed Care – PPO | Source: Ambulatory Visit | Attending: Surgery | Admitting: Surgery

## 2020-02-11 ENCOUNTER — Other Ambulatory Visit: Payer: Self-pay

## 2020-02-11 HISTORY — DX: Gastro-esophageal reflux disease without esophagitis: K21.9

## 2020-02-11 NOTE — Patient Instructions (Signed)
Your pre-procedure COVID test is scheduled on: Friday 02/15/2020.  Drive up in front of [facility] any time 8am-1pm.  Your surgery is scheduled on: Tuesday 02/19/2020 Enter through the [facility] Entrance, take elevator to 2nd floor.  Check in with surgery information desk. To find out your arrival time, call [phone] 1:00-3:00 PM on Monday 02/18/2020  Remember: Instructions that are not followed completely may result in serious medical risk, up to and including death, or upon the discretion of your surgeon and anesthesiologist your surgery may need to be rescheduled.   __x__ 1. Do not eat food (including mints, candies, chewing gum) after midnight the night before your procedure. You may drink water up to 2 hours before you are scheduled to arrive at the hospital for your procedure.  Do not drink anything within 2 hours of your scheduled arrival to the hospital.   __x__ 2. No Alcohol or smoking for 24 hours before or after surgery.  __x__ 3. Notify your doctor if there is any change in your medical condition (cold, fever, infections).  __x__ 4. On the morning of surgery brush your teeth with toothpaste and water.  You may rinse your mouth with mouthwash if you wish.  Do not swallow any toothpaste or mouthwash.  Please read over the following fact sheets that you were given: [facility] Preparing for Surgery  __x__ Use CHG Soap as directed on instruction sheet.  Do not wear jewelry, lotions, powders, deodorant or perfumes on the day of surgery. Do not shave below the face/neck for 48 hours prior to surgery.  Do not bring valuables to the hospital.  [facility], [facility], [facility] is not responsible for any belongings or valuables.   Eyeglasses, dentures or bridgework may not be worn into surgery. For patients discharged on the day of surgery, you will NOT be permitted to drive yourself home.  You must have a responsible adult with you for 24 hours after surgery.  __x__ Take  these medicines on the morning of surgery with a SMALL SIP OF WATER  1. Hydrocodone (Norco) if needed  __N/A__ Follow recommendations from Cardiologist, Pulmonologist or PCP regarding stopping Aspirin, Coumadin, Plavix, Eliquis, Effient, Pradaxa, and Pletal.  __x__ STARTING TODAY: Do not take any Anti-inflammatories such as Advil, Ibuprofen, Motrin, Aleve, Naproxen, Naprosyn, BC/Goodies powders or aspirin products. You may continue to take your Hydrocodone and Tylenol if needed.   __x__ STARTING TODAY: Do not take any over the counter vitamins or supplements until after surgery.  RN reviewed instructions with patient via telephone interview.  Patient verbalized understanding, will receive printed copy on the date of COVID test.  __________________________ Phone call 02/11/2020 @ 2:30pm

## 2020-02-15 ENCOUNTER — Other Ambulatory Visit
Admission: RE | Admit: 2020-02-15 | Discharge: 2020-02-15 | Disposition: A | Discharge status: Home / Self Care | Payer: BC Managed Care – PPO | Source: Ambulatory Visit | Attending: Surgery | Admitting: Surgery

## 2020-02-15 DIAGNOSIS — Z20822 Contact with and (suspected) exposure to covid-19: Secondary | ICD-10-CM | POA: Diagnosis not present

## 2020-02-15 DIAGNOSIS — Z01812 Encounter for preprocedural laboratory examination: Secondary | ICD-10-CM | POA: Diagnosis present

## 2020-02-16 LAB — SARS CORONAVIRUS 2 (TAT 6-24 HRS): SARS Coronavirus 2: NEGATIVE

## 2020-02-19 ENCOUNTER — Ambulatory Visit
Admission: RE | Admit: 2020-02-19 | Discharge: 2020-02-19 | Disposition: A | Discharge status: Home / Self Care | Payer: BC Managed Care – PPO | Attending: Surgery | Admitting: Surgery

## 2020-02-19 ENCOUNTER — Encounter
Admission: RE | Disposition: A | Discharge status: Home / Self Care | Payer: Self-pay | Source: Home / Self Care | Attending: Surgery

## 2020-02-19 ENCOUNTER — Other Ambulatory Visit: Payer: Self-pay

## 2020-02-19 ENCOUNTER — Encounter: Payer: Self-pay | Admitting: Surgery

## 2020-02-19 ENCOUNTER — Ambulatory Visit: Payer: BC Managed Care – PPO | Admitting: Anesthesiology

## 2020-02-19 DIAGNOSIS — K4090 Unilateral inguinal hernia, without obstruction or gangrene, not specified as recurrent: Secondary | ICD-10-CM

## 2020-02-19 DIAGNOSIS — F17210 Nicotine dependence, cigarettes, uncomplicated: Secondary | ICD-10-CM | POA: Diagnosis not present

## 2020-02-19 DIAGNOSIS — K4030 Unilateral inguinal hernia, with obstruction, without gangrene, not specified as recurrent: Secondary | ICD-10-CM | POA: Diagnosis not present

## 2020-02-19 HISTORY — PX: XI ROBOTIC ASSISTED INGUINAL HERNIA REPAIR WITH MESH: SHX6706

## 2020-02-19 LAB — URINE DRUG SCREEN, QUALITATIVE (ARMC ONLY)
Amphetamines, Ur Screen: NOT DETECTED
Barbiturates, Ur Screen: NOT DETECTED
Benzodiazepine, Ur Scrn: NOT DETECTED
Cannabinoid 50 Ng, Ur ~~LOC~~: NOT DETECTED
Cocaine Metabolite,Ur ~~LOC~~: NOT DETECTED
MDMA (Ecstasy)Ur Screen: NOT DETECTED
Methadone Scn, Ur: NOT DETECTED
Opiate, Ur Screen: NOT DETECTED
Phencyclidine (PCP) Ur S: NOT DETECTED
Tricyclic, Ur Screen: NOT DETECTED

## 2020-02-19 SURGERY — REPAIR, HERNIA, INGUINAL, ROBOT-ASSISTED, LAPAROSCOPIC, USING MESH
Anesthesia: General | Laterality: Right

## 2020-02-19 MED ORDER — FENTANYL CITRATE (PF) 100 MCG/2ML IJ SOLN
INTRAMUSCULAR | Status: AC
  Filled 2020-02-19: qty 2

## 2020-02-19 MED ORDER — ACETAMINOPHEN 500 MG PO TABS
ORAL_TABLET | ORAL | Status: AC
  Administered 2020-02-19: 1000 mg via ORAL
  Filled 2020-02-19: qty 2

## 2020-02-19 MED ORDER — LACTATED RINGERS IV SOLN: INTRAVENOUS | Status: DC

## 2020-02-19 MED ORDER — ONDANSETRON HCL 4 MG/2ML IJ SOLN
INTRAMUSCULAR | Status: DC | PRN
  Administered 2020-02-19: 4 mg via INTRAVENOUS

## 2020-02-19 MED ORDER — GABAPENTIN 300 MG PO CAPS: 300.0000 mg | ORAL_CAPSULE | ORAL | Status: AC

## 2020-02-19 MED ORDER — CHLORHEXIDINE GLUCONATE CLOTH 2 % EX PADS
6.0000 | MEDICATED_PAD | Freq: Once | CUTANEOUS | Status: AC
  Administered 2020-02-19: 10:00:00 6 via TOPICAL

## 2020-02-19 MED ORDER — LIDOCAINE HCL (CARDIAC) PF 100 MG/5ML IV SOSY
PREFILLED_SYRINGE | INTRAVENOUS | Status: DC | PRN
  Administered 2020-02-19: 80 mg via INTRAVENOUS

## 2020-02-19 MED ORDER — BUPIVACAINE LIPOSOME 1.3 % IJ SUSP
INTRAMUSCULAR | Status: DC | PRN
  Administered 2020-02-19: 20 mL

## 2020-02-19 MED ORDER — GABAPENTIN 300 MG PO CAPS
ORAL_CAPSULE | ORAL | Status: AC
  Administered 2020-02-19: 300 mg via ORAL
  Filled 2020-02-19: qty 1

## 2020-02-19 MED ORDER — MEPERIDINE HCL 50 MG/ML IJ SOLN: 6.2500 mg | INTRAMUSCULAR | Status: DC | PRN

## 2020-02-19 MED ORDER — DEXAMETHASONE SODIUM PHOSPHATE 10 MG/ML IJ SOLN
INTRAMUSCULAR | Status: DC | PRN
  Administered 2020-02-19: 10 mg via INTRAVENOUS

## 2020-02-19 MED ORDER — BUPIVACAINE-EPINEPHRINE (PF) 0.25% -1:200000 IJ SOLN
INTRAMUSCULAR | Status: AC
  Filled 2020-02-19: qty 30

## 2020-02-19 MED ORDER — LIDOCAINE HCL (PF) 2 % IJ SOLN
INTRAMUSCULAR | Status: AC
  Filled 2020-02-19: qty 5

## 2020-02-19 MED ORDER — PROPOFOL 10 MG/ML IV BOLUS
INTRAVENOUS | Status: DC | PRN
  Administered 2020-02-19: 150 mg via INTRAVENOUS

## 2020-02-19 MED ORDER — PROPOFOL 10 MG/ML IV BOLUS
INTRAVENOUS | Status: AC
  Filled 2020-02-19: qty 20

## 2020-02-19 MED ORDER — OXYCODONE HCL 5 MG/5ML PO SOLN: 5.0000 mg | Freq: Once | ORAL | Status: DC | PRN

## 2020-02-19 MED ORDER — OXYCODONE HCL 5 MG PO TABS: 5.0000 mg | ORAL_TABLET | Freq: Once | ORAL | Status: DC | PRN

## 2020-02-19 MED ORDER — FAMOTIDINE 20 MG PO TABS: 20.0000 mg | ORAL_TABLET | Freq: Once | ORAL | Status: AC

## 2020-02-19 MED ORDER — ACETAMINOPHEN 500 MG PO TABS: 1000.0000 mg | ORAL_TABLET | ORAL | Status: AC

## 2020-02-19 MED ORDER — CELECOXIB 200 MG PO CAPS
ORAL_CAPSULE | ORAL | Status: AC
  Administered 2020-02-19: 200 mg via ORAL
  Filled 2020-02-19: qty 1

## 2020-02-19 MED ORDER — MIDAZOLAM HCL 2 MG/2ML IJ SOLN
INTRAMUSCULAR | Status: DC | PRN
  Administered 2020-02-19: 2 mg via INTRAVENOUS

## 2020-02-19 MED ORDER — CHLORHEXIDINE GLUCONATE CLOTH 2 % EX PADS: 6.0000 | MEDICATED_PAD | Freq: Once | CUTANEOUS | Status: DC

## 2020-02-19 MED ORDER — HYDROCODONE-ACETAMINOPHEN 5-325 MG PO TABS: 1.0000 | ORAL_TABLET | ORAL | 0 refills | Status: DC | PRN

## 2020-02-19 MED ORDER — FAMOTIDINE 20 MG PO TABS
ORAL_TABLET | ORAL | Status: AC
  Administered 2020-02-19: 20 mg via ORAL
  Filled 2020-02-19: qty 1

## 2020-02-19 MED ORDER — FENTANYL CITRATE (PF) 100 MCG/2ML IJ SOLN: 25.0000 ug | INTRAMUSCULAR | Status: DC | PRN

## 2020-02-19 MED ORDER — FENTANYL CITRATE (PF) 100 MCG/2ML IJ SOLN
INTRAMUSCULAR | Status: DC | PRN
  Administered 2020-02-19 (×3): 50 ug via INTRAVENOUS

## 2020-02-19 MED ORDER — SUGAMMADEX SODIUM 200 MG/2ML IV SOLN
INTRAVENOUS | Status: DC | PRN
  Administered 2020-02-19: 130 mg via INTRAVENOUS

## 2020-02-19 MED ORDER — ONDANSETRON HCL 4 MG/2ML IJ SOLN
INTRAMUSCULAR | Status: AC
  Filled 2020-02-19: qty 2

## 2020-02-19 MED ORDER — ROCURONIUM BROMIDE 10 MG/ML (PF) SYRINGE
PREFILLED_SYRINGE | INTRAVENOUS | Status: AC
  Filled 2020-02-19: qty 10

## 2020-02-19 MED ORDER — CEFAZOLIN SODIUM-DEXTROSE 2-4 GM/100ML-% IV SOLN
2.0000 g | INTRAVENOUS | Status: AC
  Administered 2020-02-19: 2 g via INTRAVENOUS

## 2020-02-19 MED ORDER — CHLORHEXIDINE GLUCONATE 4 % EX LIQD: 60.0000 mL | Freq: Once | CUTANEOUS | Status: DC

## 2020-02-19 MED ORDER — PHENYLEPHRINE HCL (PRESSORS) 10 MG/ML IV SOLN
INTRAVENOUS | Status: AC
  Filled 2020-02-19: qty 1

## 2020-02-19 MED ORDER — ROCURONIUM BROMIDE 100 MG/10ML IV SOLN
INTRAVENOUS | Status: DC | PRN
  Administered 2020-02-19: 50 mg via INTRAVENOUS
  Administered 2020-02-19: 20 mg via INTRAVENOUS

## 2020-02-19 MED ORDER — CELECOXIB 200 MG PO CAPS: 200.0000 mg | ORAL_CAPSULE | ORAL | Status: AC

## 2020-02-19 MED ORDER — DEXAMETHASONE SODIUM PHOSPHATE 10 MG/ML IJ SOLN
INTRAMUSCULAR | Status: AC
  Filled 2020-02-19: qty 1

## 2020-02-19 MED ORDER — MIDAZOLAM HCL 2 MG/2ML IJ SOLN
INTRAMUSCULAR | Status: AC
  Filled 2020-02-19: qty 2

## 2020-02-19 MED ORDER — BUPIVACAINE LIPOSOME 1.3 % IJ SUSP
INTRAMUSCULAR | Status: AC
  Filled 2020-02-19: qty 20

## 2020-02-19 MED ORDER — BUPIVACAINE-EPINEPHRINE 0.25% -1:200000 IJ SOLN
INTRAMUSCULAR | Status: DC | PRN
  Administered 2020-02-19: 3 mL

## 2020-02-19 MED ORDER — PHENYLEPHRINE HCL (PRESSORS) 10 MG/ML IV SOLN
INTRAVENOUS | Status: DC | PRN
  Administered 2020-02-19 (×3): 100 ug via INTRAVENOUS

## 2020-02-19 MED ORDER — PROMETHAZINE HCL 25 MG/ML IJ SOLN: 6.2500 mg | INTRAMUSCULAR | Status: DC | PRN

## 2020-02-19 MED ORDER — CEFAZOLIN SODIUM-DEXTROSE 2-4 GM/100ML-% IV SOLN
INTRAVENOUS | Status: AC
  Filled 2020-02-19: qty 100

## 2020-02-19 SURGICAL SUPPLY — 46 items
ADH SKN CLS APL DERMABOND .7 (GAUZE/BANDAGES/DRESSINGS) ×1
APL PRP STRL LF DISP 70% ISPRP (MISCELLANEOUS) ×1
CANISTER SUCT 1200ML W/VALVE (MISCELLANEOUS) ×3 IMPLANT
CHLORAPREP W/TINT 26 (MISCELLANEOUS) ×3 IMPLANT
COVER TIP SHEARS 8 DVNC (MISCELLANEOUS) ×1 IMPLANT
COVER TIP SHEARS 8MM DA VINCI (MISCELLANEOUS) ×3
COVER WAND RF STERILE (DRAPES) ×3 IMPLANT
DEFOGGER SCOPE WARMER CLEARIFY (MISCELLANEOUS) ×3 IMPLANT
DERMABOND ADVANCED (GAUZE/BANDAGES/DRESSINGS) ×2
DERMABOND ADVANCED .7 DNX12 (GAUZE/BANDAGES/DRESSINGS) ×1 IMPLANT
DRAPE 3/4 80X56 (DRAPES) ×3 IMPLANT
DRAPE ARM DVNC X/XI (DISPOSABLE) ×3 IMPLANT
DRAPE COLUMN DVNC XI (DISPOSABLE) ×1 IMPLANT
DRAPE DA VINCI XI ARM (DISPOSABLE) ×9
DRAPE DA VINCI XI COLUMN (DISPOSABLE) ×3
ELECT CAUTERY BLADE 6.4 (BLADE) ×3 IMPLANT
ELECT REM PT RETURN 9FT ADLT (ELECTROSURGICAL) ×3
ELECTRODE REM PT RTRN 9FT ADLT (ELECTROSURGICAL) ×1 IMPLANT
GLOVE BIO SURGEON STRL SZ7 (GLOVE) ×12 IMPLANT
GOWN STRL REUS W/ TWL LRG LVL3 (GOWN DISPOSABLE) ×4 IMPLANT
GOWN STRL REUS W/TWL LRG LVL3 (GOWN DISPOSABLE) ×12
IRRIGATION STRYKERFLOW (MISCELLANEOUS) ×1 IMPLANT
IRRIGATOR STRYKERFLOW (MISCELLANEOUS)
IV NS 1000ML (IV SOLUTION)
IV NS 1000ML BAXH (IV SOLUTION) IMPLANT
KIT PINK PAD W/HEAD ARE REST (MISCELLANEOUS) ×3
KIT PINK PAD W/HEAD ARM REST (MISCELLANEOUS) ×1 IMPLANT
LABEL OR SOLS (LABEL) ×3 IMPLANT
MESH 3DMAX 4X6 RT LRG (Mesh General) ×2 IMPLANT
NEEDLE HYPO 22GX1.5 SAFETY (NEEDLE) ×3 IMPLANT
OBTURATOR OPTICAL STANDARD 8MM (TROCAR) ×3
OBTURATOR OPTICAL STND 8 DVNC (TROCAR) ×1
OBTURATOR OPTICALSTD 8 DVNC (TROCAR) ×1 IMPLANT
PACK LAP CHOLECYSTECTOMY (MISCELLANEOUS) ×3 IMPLANT
PENCIL ELECTRO HAND CTR (MISCELLANEOUS) ×3 IMPLANT
SEAL CANN UNIV 5-8 DVNC XI (MISCELLANEOUS) ×3 IMPLANT
SEAL XI 5MM-8MM UNIVERSAL (MISCELLANEOUS) ×9
SOLUTION ELECTROLUBE (MISCELLANEOUS) ×3 IMPLANT
SPONGE LAP 18X18 RF (DISPOSABLE) ×3 IMPLANT
SUT MNCRL AB 4-0 PS2 18 (SUTURE) ×3 IMPLANT
SUT VIC AB 2-0 SH 27 (SUTURE) ×6
SUT VIC AB 2-0 SH 27XBRD (SUTURE) ×2 IMPLANT
SUT VICRYL 0 AB UR-6 (SUTURE) ×6 IMPLANT
SUT VLOC 90 S/L VL9 GS22 (SUTURE) ×3 IMPLANT
TROCAR BALLN GELPORT 12X130M (ENDOMECHANICALS) ×3 IMPLANT
TUBING EVAC SMOKE HEATED PNEUM (TUBING) ×3 IMPLANT

## 2020-02-19 NOTE — Transfer of Care (Signed)
Immediate Anesthesia Transfer of Care Note  Patient: Joshua Harmon  Procedure(s) Performed: XI ROBOTIC ASSISTED Right INGUINAL HERNIA REPAIR WITH MESH (Right )  Patient Location: PACU  Anesthesia Type:General  Level of Consciousness: sedated  Airway & Oxygen Therapy: Patient connected to face mask oxygen  Post-op Assessment: Post -op Vital signs reviewed and stable  Post vital signs: stable  Last Vitals:  Vitals Value Taken Time  BP 134/96 02/19/20 1310  Temp    Pulse    Resp 17 02/19/20 1310  SpO2    Vitals shown include unvalidated device data.  Last Pain:  Vitals:   02/19/20 0922  TempSrc: Tympanic  PainSc: 8          Complications: No apparent anesthesia complications

## 2020-02-19 NOTE — Interval H&P Note (Signed)
History and Physical Interval Note:  02/19/2020 10:40 AM  Joshua Harmon  has presented today for surgery, with the diagnosis of Right inguinal hernia.  The various methods of treatment have been discussed with the patient and family. After consideration of risks, benefits and other options for treatment, the patient has consented to  Procedure(s): XI ROBOTIC ASSISTED Right INGUINAL HERNIA REPAIR WITH MESH (Right) as a surgical intervention.  The patient's history has been reviewed, patient examined, no change in status, stable for surgery.  I have reviewed the patient's chart and labs.  Questions were answered to the patient's satisfaction.     Devaney Segers F Deniel Mcquiston

## 2020-02-19 NOTE — Anesthesia Preprocedure Evaluation (Signed)
Anesthesia Evaluation  Patient identified by MRN, date of birth, ID band Patient awake    Reviewed: Allergy & Precautions, NPO status , Patient's Chart, lab work & pertinent test results  History of Anesthesia Complications Negative for: history of anesthetic complications  Airway Mallampati: II  TM Distance: >3 FB Neck ROM: Full    Dental  (+) Partial Upper, Poor Dentition, Missing   Pulmonary neg sleep apnea, neg COPD, Current Smoker and Patient abstained from smoking.,    breath sounds clear to auscultation- rhonchi (-) wheezing      Cardiovascular (-) hypertension(-) CAD, (-) Past MI, (-) Cardiac Stents and (-) CABG  Rhythm:Regular Rate:Normal - Systolic murmurs and - Diastolic murmurs    Neuro/Psych neg Seizures negative neurological ROS  negative psych ROS   GI/Hepatic Neg liver ROS, GERD  ,  Endo/Other  negative endocrine ROSneg diabetes  Renal/GU negative Renal ROS     Musculoskeletal negative musculoskeletal ROS (+)   Abdominal (+) - obese,   Peds  Hematology negative hematology ROS (+)   Anesthesia Other Findings Past Medical History: No date: GERD (gastroesophageal reflux disease)   Reproductive/Obstetrics                             Anesthesia Physical Anesthesia Plan  ASA: II  Anesthesia Plan: General   Post-op Pain Management:    Induction: Intravenous  PONV Risk Score and Plan: 0 and Ondansetron  Airway Management Planned: Oral ETT  Additional Equipment:   Intra-op Plan:   Post-operative Plan: Extubation in OR  Informed Consent: I have reviewed the patients History and Physical, chart, labs and discussed the procedure including the risks, benefits and alternatives for the proposed anesthesia with the patient or authorized representative who has indicated his/her understanding and acceptance.     Dental advisory given  Plan Discussed with: CRNA and  Anesthesiologist  Anesthesia Plan Comments:         Anesthesia Quick Evaluation

## 2020-02-19 NOTE — Anesthesia Procedure Notes (Signed)
Procedure Name: Intubation Date/Time: 02/19/2020 11:12 AM Performed by: Irving Burton, CRNA Pre-anesthesia Checklist: Patient identified, Emergency Drugs available, Suction available and Patient being monitored Patient Re-evaluated:Patient Re-evaluated prior to induction Oxygen Delivery Method: Circle system utilized Preoxygenation: Pre-oxygenation with 100% oxygen Induction Type: IV induction Ventilation: Mask ventilation without difficulty Laryngoscope Size: McGraph and 4 Grade View: Grade I Tube type: Oral Tube size: 7.5 mm Number of attempts: 1 Airway Equipment and Method: Stylet and Video-laryngoscopy Placement Confirmation: ETT inserted through vocal cords under direct vision,  positive ETCO2 and breath sounds checked- equal and bilateral Secured at: 23 cm Tube secured with: Tape Dental Injury: Teeth and Oropharynx as per pre-operative assessment

## 2020-02-19 NOTE — Anesthesia Postprocedure Evaluation (Signed)
Anesthesia Post Note  Patient: Joshua Harmon  Procedure(s) Performed: XI ROBOTIC ASSISTED Right INGUINAL HERNIA REPAIR WITH MESH (Right )  Patient location during evaluation: PACU Anesthesia Type: General Level of consciousness: awake and alert and oriented Pain management: pain level controlled Vital Signs Assessment: post-procedure vital signs reviewed and stable Respiratory status: spontaneous breathing, nonlabored ventilation and respiratory function stable Cardiovascular status: blood pressure returned to baseline and stable Postop Assessment: no signs of nausea or vomiting Anesthetic complications: no     Last Vitals:  Vitals:   02/19/20 1354 02/19/20 1404  BP: (!) 138/100 (!) 140/108  Pulse: 87 88  Resp: 19 16  Temp: 36.6 C 36.6 C  SpO2: 98% 95%    Last Pain:  Vitals:   02/19/20 1404  TempSrc: Temporal  PainSc: 0-No pain                 Mccall Will

## 2020-02-19 NOTE — Op Note (Signed)
Robotic assisted Laparoscopic Transabdominal Right Inguinal Hernia Repair with 3 D large Mesh       Pre-operative Diagnosis:  Right Chronically incarcerated Inguinal Hernia   Post-operative Diagnosis: Same   Procedure: Robotic  Laparoscopic  repair of Right Indirect Giant inguinal hernia   Surgeon: Sterling Big, MD FACS   Anesthesia: Gen. with endotracheal tube   Findings: Right inguinal hernia with chronic incarceration   Procedure Details  The patient was seen again in the Holding Room. The benefits, complications, treatment options, and expected outcomes were discussed with the patient. The risks of bleeding, infection, recurrence of symptoms, failure to resolve symptoms, recurrence of hernia, ischemic orchitis, chronic pain syndrome or neuroma, were discussed again. The likelihood of improving the patient's symptoms with return to their baseline status is good.  The patient and/or family concurred with the proposed plan, giving informed consent.  The patient was taken to Operating Room, identified  and the procedure verified as Laparoscopic Inguinal Hernia Repair. Laterality confirmed.  A Time Out was held and the above information confirmed.   Prior to the induction of general anesthesia, antibiotic prophylaxis was administered. VTE prophylaxis was in place. General endotracheal anesthesia was then administered and tolerated well. After the induction, the abdomen was prepped with Chloraprep and draped in the sterile fashion. The patient was positioned in the supine position.    Supraumbilical incision created and cut down technique used to enter the abdominal cavity. Fascia elevated and incised and hasson trochar placed. Pneumoperitoneum obtained w/o HD changes. No evidence of bowel injuries.  Two 8 mm placed under direct vision. The laparoscopy revealed chronic incarcerated  large indirect defects on the Right. I was able to reduce the hernia by pushing the inguinal region. There was a  piece of small bowel that was chronically incarcerated. It was viable and was mildly edematous. No evidence of ischemia.  I inserted the needles and the mesh. The robot was brought ot the table and docked in the standard fashion, no collision between arms was observed. Instruments were kept under direct view at all times. We started on the right side were a flap was created. The sac was reduced and dissected free from adjacent structures. It was a giant sac and I had to use a prograsp instrument. We preserved the vas and the vessels. Once dissection was completed a large 3D mesh was placed and secured with two interrupted vicryl attached to the pubic tubercle. There was good coverage of the direct, indirect and femoral spaces. The flap was closed with v lock suture.  Second look revealed no complications or injuries.    Once assuring that hemostasis was adequate the ports were removed and a figure-of-eight 0 Vicryl suture was placed at the fascial edges. 4-0 subcuticular Monocryl was used at all skin edges. Dermabond was placed. Liposomal marcaine was injected on all surgical post incisions full thickness abd wall block. Patient tolerated the procedure well. There were no complications. He was taken to the recovery room in stable condition.                 Sterling Big, MD, FACS

## 2020-02-19 NOTE — Discharge Instructions (Signed)

## 2020-02-20 ENCOUNTER — Telehealth: Payer: Self-pay | Admitting: *Deleted

## 2020-02-20 NOTE — Telephone Encounter (Signed)
Faxed FMLA to [phone]

## 2020-03-05 ENCOUNTER — Telehealth (INDEPENDENT_AMBULATORY_CARE_PROVIDER_SITE_OTHER): Payer: Self-pay | Admitting: Surgery

## 2020-03-05 ENCOUNTER — Other Ambulatory Visit: Payer: Self-pay

## 2020-03-05 ENCOUNTER — Encounter: Payer: Self-pay | Admitting: Surgery

## 2020-03-05 DIAGNOSIS — Z09 Encounter for follow-up examination after completed treatment for conditions other than malignant neoplasm: Secondary | ICD-10-CM

## 2020-03-05 NOTE — Progress Notes (Signed)
Virtual visit.  I connected with the patient via his cell phone.  He is doing very well without any pain or issues.  He is tolerating regular diet.  He is ambulating.  He is waiting for his disability. No concerns whatsoever. We will see him on a as needed basis

## 2020-03-10 ENCOUNTER — Telehealth: Payer: Self-pay | Admitting: *Deleted

## 2020-03-10 NOTE — Telephone Encounter (Signed)
Faxed FMLA/Medical information to [place] [phone]

## 2020-03-31 ENCOUNTER — Telehealth: Payer: Self-pay | Admitting: *Deleted

## 2020-03-31 NOTE — Telephone Encounter (Signed)
Patient states that he will be returning to work on 04/01/20. This will make 6 weeks since his surgery and he will have no restrictions in place. A note has been written and he will pick this up sometime today.

## 2020-03-31 NOTE — Telephone Encounter (Signed)
Patient called and stated that he needs a return to work note stating what restrictions he has. He is returning to work and would like to come pick it up.   Patient had surgery on 02/19/20 Dr Everlene Farrier right inguinal hernia repair

## 2024-05-07 ENCOUNTER — Emergency Department

## 2024-05-07 ENCOUNTER — Emergency Department
Admission: EM | Admit: 2024-05-07 | Discharge: 2024-05-07 | Discharge status: Against Medical Advice | Attending: Emergency Medicine | Admitting: Emergency Medicine

## 2024-05-07 ENCOUNTER — Other Ambulatory Visit: Payer: Self-pay

## 2024-05-07 DIAGNOSIS — R270 Ataxia, unspecified: Secondary | ICD-10-CM | POA: Diagnosis not present

## 2024-05-07 DIAGNOSIS — R531 Weakness: Secondary | ICD-10-CM | POA: Diagnosis present

## 2024-05-07 DIAGNOSIS — R29898 Other symptoms and signs involving the musculoskeletal system: Secondary | ICD-10-CM

## 2024-05-07 DIAGNOSIS — Z5329 Procedure and treatment not carried out because of patient's decision for other reasons: Secondary | ICD-10-CM | POA: Diagnosis not present

## 2024-05-07 DIAGNOSIS — G939 Disorder of brain, unspecified: Secondary | ICD-10-CM | POA: Diagnosis not present

## 2024-05-07 DIAGNOSIS — G9389 Other specified disorders of brain: Secondary | ICD-10-CM

## 2024-05-07 LAB — COMPREHENSIVE METABOLIC PANEL WITH GFR
ALT: 19 U/L (ref 0–44)
AST: 25 U/L (ref 15–41)
Albumin: 3.8 g/dL (ref 3.5–5.0)
Alkaline Phosphatase: 51 U/L (ref 38–126)
Anion gap: 10 (ref 5–15)
BUN: 15 mg/dL (ref 8–23)
CO2: 24 mmol/L (ref 22–32)
Calcium: 9.2 mg/dL (ref 8.9–10.3)
Chloride: 106 mmol/L (ref 98–111)
Creatinine, Ser: 0.73 mg/dL (ref 0.61–1.24)
GFR, Estimated: >60 mL/min (ref >60)
Glucose, Bld: 97 mg/dL (ref 70–99)
Potassium: 3.9 mmol/L (ref 3.5–5.1)
Sodium: 140 mmol/L (ref 135–145)
Total Bilirubin: 0.7 mg/dL (ref 0.0–1.2)
Total Protein: 7.4 g/dL (ref 6.5–8.1)

## 2024-05-07 LAB — CBC WITH DIFFERENTIAL/PLATELET
Abs Immature Granulocytes: 0.01 10*3/uL (ref 0.00–0.07)
Basophils Absolute: 0 10*3/uL (ref 0.0–0.1)
Basophils Relative: 0 %
Eosinophils Absolute: 0.1 10*3/uL (ref 0.0–0.5)
Eosinophils Relative: 1 %
HCT: 44.7 % (ref 39.0–52.0)
Hemoglobin: 15 g/dL (ref 13.0–17.0)
Immature Granulocytes: 0 %
Lymphocytes Relative: 35 %
Lymphs Abs: 2 10*3/uL (ref 0.7–4.0)
MCH: 31.3 pg (ref 26.0–34.0)
MCHC: 33.6 g/dL (ref 30.0–36.0)
MCV: 93.1 fL (ref 80.0–100.0)
Monocytes Absolute: 0.5 10*3/uL (ref 0.1–1.0)
Monocytes Relative: 9 %
Neutro Abs: 3.1 10*3/uL (ref 1.7–7.7)
Neutrophils Relative %: 55 %
Platelets: 317 10*3/uL (ref 150–400)
RBC: 4.8 MIL/uL (ref 4.22–5.81)
RDW: 13.4 % (ref 11.5–15.5)
WBC: 5.7 10*3/uL (ref 4.0–10.5)
nRBC: 0 % (ref 0.0–0.2)

## 2024-05-07 LAB — TROPONIN I (HIGH SENSITIVITY): Troponin I (High Sensitivity): 4 ng/L (ref <18)

## 2024-05-07 NOTE — ED Notes (Signed)
 Notified by provider that patient declined recommended MRI for further evaluation of CT findings and wished to leave AMA. Pt re-stated desire to leave AMA to this nurse. Discharge recommendations reviewed with patient who verbalized understanding.

## 2024-05-07 NOTE — ED Triage Notes (Signed)
 Pt comes with bilateral leg pain and weakness for about 3 days. 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

## 2024-05-07 NOTE — ED Provider Notes (Signed)
 ----------------------------------------- 7:38 PM on 05/07/2024 -----------------------------------------  Blood pressure (!) 139/92, pulse 93, temperature 97.7 F (36.5 C), temperature source Oral, resp. rate 18, height 6' 1 (1.854 m), weight 68 kg, SpO2 98%.  Assuming care from Dr. Dawayne Estrin, NP-C.  In short, Joshua Harmon is a 63 y.o. male with a chief complaint of Leg Pain .  Refer to the original H&P for additional details.  The current plan of care is to await pending MRI and disposition accordingly.  Patient initially agreed to the MRI, however, I had been notified by the MRI tech, that at the time of contact, the patient has refused MRI.  ____________________________________________    ED Results / Procedures / Treatments   Labs (all labs ordered are listed, but only abnormal results are displayed) Labs Reviewed  COMPREHENSIVE METABOLIC PANEL WITH GFR  CBC WITH DIFFERENTIAL/PLATELET  TROPONIN I (HIGH SENSITIVITY)    EKG   RADIOLOGY  I personally viewed and evaluated these images as part of my medical decision making, as well as reviewing the written report by the radiologist.  ED Provider Interpretation: 1.7 cm masslike hyperdensity in the lateral ventricle  CT Head Wo Contrast Result Date: 05/07/2024 CLINICAL DATA:  Neuro deficit, acute, stroke suspected Outsid of stroke protocol window EXAM: CT HEAD WITHOUT CONTRAST TECHNIQUE: Contiguous axial images were obtained from the base of the skull through the vertex without intravenous contrast. RADIATION DOSE REDUCTION: This exam was performed according to the departmental dose-optimization program which includes automated exposure control, adjustment of the mA and/or kV according to patient size and/or use of iterative reconstruction technique. COMPARISON:  None Available. FINDINGS: Brain: No evidence of large-territorial acute infarction. No parenchymal hemorrhage. Rounded right periventricular 1.7 cm masslike  hyperdensity (2:19) along the frontal horn of the lateral ventricle. No extra-axial collection. No mass effect or midline shift. No hydrocephalus. Basilar cisterns are patent. Vascular: No hyperdense vessel. Skull: No acute fracture or focal lesion. Sinuses/Orbits: Paranasal sinuses and mastoid air cells are clear. The orbits are unremarkable. Other: None. IMPRESSION: Rounded right periventricular 1.7 cm masslike hyperdensity along the frontal 1 of the lateral ventricle. Recommend MRI with and without contrast for further evaluation. Electronically Signed   By: Morgane  Naveau M.D.   On: 05/07/2024 17:44     PROCEDURES:  Critical Care performed: No  Procedures   MEDICATIONS ORDERED IN ED: Medications - No data to display   IMPRESSION / MDM / ASSESSMENT AND PLAN / ED COURSE  I reviewed the triage vital signs and the nursing notes.                              Differential diagnosis includes, but is not limited to,   Patient's presentation is most consistent with acute presentation with potential threat to life or bodily function.  Patient's diagnosis is consistent with bilateral leg weakness and ataxia with an incidental finding of brain mass on plain CT.  Patient has declined MRI imaging at this time, and will be discharged from the ED AGAINST MEDICAL ADVICE.  Patient is understanding of the incomplete workup at this time.  He advises that financial restraints prevent him from proceeding with the test.  He has been given opportunity to ask questions has been encouraged to report to the ED soon as possible for reevaluation as discussed.  He is also advised to return to the ED should his symptoms progress or worsen.  Patient is to follow up  with local PCP as referred, as needed or otherwise directed. Patient is given ED precautions to return to the ED for any worsening or new symptoms.  05/07/2024 at 7:42 PM:  The patient requested to leave.  I considered this to be leaving against medical  advice. I personally discussed the following with them:   That they currently had a medical condition of incidental brain mass and I am concerned that they may have malignancy, benign tumor, demyelinating disorder, ataxia, electrolyte abnormality, infection   My proposed course of evaluation and treatment includes, but is not limited to,  CT/ MRI brain.  Benefits of staying include possible diagnosis or excluding of malignancy or an alternative serious condition such as brain tumor, which if identified early would lead to appropriate intervention in a timely manner lessening the burden of disability and death.  Risks of leaving before this had been completed include: misdiagnosis, worsening illness leading up to and including prolonged or permanent disability or death.  Specific risks pertinent, but not all inclusive, of their current medical condition include but are not limited to disability and death.  I also discussed alternatives including pre-procedure medications.  Despite this they stated they wanted to leave due to financial constraints and belief that the condition is due to  and refused further evaluation, treatment, or admission at this time.   They appeared clinically sober, were mentating appropriately, were free from distracting injury, had adequately controlled acute pain, appeared to have intact insight, judgment, and reason, and in my opinion had the capacity to make this decision.  Specifically, they were able to verbally state back in a coherent manner their current medical condition/current diagnosis, the proposed course of evaluation and/or treatment, and the risks, benefits, and alternatives of treatment versus leaving against medical advice.   They understand that they may return to seek medical attention here at ANY time they want.  I strongly advised them to return to the Emergency Department immediately if they experience any new or worsening symptoms that concern them, or  simply if they reconsider continued evaluation and/or treatment as previously discussed.  This would be without any repercussions, though they understand they likely will need to wait again in the Emergency Department if other patients are in front of them, rather than being brought straight back.  They understood this is another advantage of staying, but still insisted upon leaving.  I recommended they follow-up with this ED at the earliest available opportunity/appointment for further evaluation and treatment.   The patient was discharged against medical advice.  They did accept written discharge instructions.   FINAL CLINICAL IMPRESSION(S) / ED DIAGNOSES   Final diagnoses:  Bilateral leg weakness  Brain mass  Ataxia     Rx / DC Orders   ED Discharge Orders     None        Note:  This document was prepared using Dragon voice recognition software and may include unintentional dictation errors.    May Sparks, PA-C 05/07/24 1947    Twilla Galea, MD 05/07/24 2231

## 2024-05-07 NOTE — Discharge Instructions (Addendum)
 Your exam and CT scan are concerning for an unusual finding.  It is unclear whether this is the reason for your leg weakness and difficulty walking.  You have been offered an MRI to further evaluate your brain, but have decided to decline that test at this time.  We do not have a clear understanding of your symptoms and also do not know if this abnormal brain finding is life-threatening.  We recommend you return to the ED soon as possible for MRI testing of the brain.  You should also consider following up with a local primary provider for routine medical care.  Return to the ED,as soon as possible, for any worsening of your balance or leg strength.

## 2024-05-07 NOTE — ED Provider Notes (Signed)
 [facility] Provider Note    Event Date/Time   First MD Initiated Contact with Patient 05/07/24 1702     (approximate)   History   Leg Pain   HPI  Joshua Harmon is a 63 y.o. male  with history of leg surgery as a child and as listed in EMR presents to the emergency department for treatment and evaluation of bilateral leg weakness and poor balance for the past 3 days.  He denies pain but states he occasionally has some tingling and numbness in his lower extremities.  He does not recall what he was doing when he first noticed this 3 days ago but denies injury.      Physical Exam   Triage Vital Signs: ED Triage Vitals  Encounter Vitals Group     BP 05/07/24 1619 (!) 139/92     Girls Systolic BP Percentile --      Girls Diastolic BP Percentile --      Boys Systolic BP Percentile --      Boys Diastolic BP Percentile --      Pulse Rate 05/07/24 1619 93     Resp 05/07/24 1619 18     Temp 05/07/24 1619 97.7 F (36.5 C)     Temp Source 05/07/24 1619 Oral     SpO2 05/07/24 1619 98 %     Weight 05/07/24 1622 150 lb (68 kg)     Height 05/07/24 1622 6' 1 (1.854 m)     Head Circumference --      Peak Flow --      Pain Score 05/07/24 1622 9     Pain Loc --      Pain Education --      Exclude from Growth Chart --     Most recent vital signs: Vitals:   05/07/24 1619 05/07/24 1948  BP: (!) 139/92 (!) 155/99  Pulse: 93 78  Resp: 18   Temp: 97.7 F (36.5 C) 97.8 F (36.6 C)  SpO2: 98% 98%    General: Awake, no distress. Unstable upon standing. CV:  Good peripheral perfusion.  Resp:  Normal effort.  Abd:  No distention.  Other:     ED Results / Procedures / Treatments   Labs (all labs ordered are listed, but only abnormal results are displayed) Labs Reviewed  COMPREHENSIVE METABOLIC PANEL WITH GFR  CBC WITH DIFFERENTIAL/PLATELET  TROPONIN I (HIGH SENSITIVITY)     EKG  Not indicated.   RADIOLOGY  Image and radiology report  reviewed and interpreted by me. Radiology report consistent with the same.  Rounded right periventricular 1.7cm masslike hyperdensity along the frontal 1 of the lateral ventricle.  PROCEDURES:  Critical Care performed: No  Procedures   MEDICATIONS ORDERED IN ED:  Medications - No data to display   IMPRESSION / MDM / ASSESSMENT AND PLAN / ED COURSE   I have reviewed the triage note.  Differential diagnosis includes, but is not limited to, CVA, lumbar radiculopathy, vertigo  Patient's presentation is most consistent with acute presentation with potential threat to life or bodily function.  63 year old male presenting to the emergency department for treatment and evaluation of bilateral lower extremity weakness and poor balance. See HPI.  CT concerning for mass. MRI recommended.   Results of CT discussed with patient and family. Need for MRI discussed. Patient initially resistant to waiting for further imaging, but family encouraged him to stay and is now agreeable.  MRI pending. Care relinquished to [facility], PA-C  who will follow up on results and determine disposition.      FINAL CLINICAL IMPRESSION(S) / ED DIAGNOSES   Final diagnoses:  Bilateral leg weakness  Brain mass  Ataxia     Rx / DC Orders   ED Discharge Orders     None        Note:  This document was prepared using Dragon voice recognition software and may include unintentional dictation errors.   Herlinda Kirk NOVAK, FNP 05/14/24 9247    Willo Dunnings, MD 05/18/24 [phone]

## 2024-05-07 NOTE — ED Provider Triage Note (Signed)
 Emergency Medicine Provider Triage Evaluation Note  Joshua Harmon , a 63 y.o. male  was evaluated in triage.  Pt complains of bilateral calf pain.  Patient denies history of trauma.  Review of Systems  Positive:  Negative:   Physical Exam  BP (!) 139/92 (BP Location: Right Arm)   Pulse 93   Temp 97.7 F (36.5 C) (Oral)   Resp 18   Ht 6' 1 (1.854 m)   Wt 68 kg   SpO2 98%   BMI 19.79 kg/m during triage patient is hypertensive Gen:   Awake, no distress   Resp:  Normal effort  MSK:   Moves extremities without difficulty , lateral muscular atrophy, no tenderness to palpation in the calf, skin is intact, pulses positive. Other:    Medical Decision Making  Medically screening exam initiated at 4:25 PM.  Appropriate orders placed.  Joshua Harmon was informed that the remainder of the evaluation will be completed by another provider, this initial triage assessment does not replace that evaluation, and the importance of remaining in the ED until their evaluation is complete. Patient with bilateral lower leg pain, without signs of infection or DVT.  No history of trauma.   Awilda Lennox, PA-C 05/07/24 1627

## 2024-07-25 ENCOUNTER — Inpatient Hospital Stay

## 2024-07-25 ENCOUNTER — Emergency Department

## 2024-07-25 ENCOUNTER — Encounter: Payer: Self-pay | Admitting: Intensive Care

## 2024-07-25 ENCOUNTER — Inpatient Hospital Stay
Admission: EM | Admit: 2024-07-25 | Discharge: 2024-07-26 | DRG: 054 | Disposition: A | Discharge status: Other Acute Inpatient Hospital

## 2024-07-25 ENCOUNTER — Other Ambulatory Visit: Payer: Self-pay

## 2024-07-25 DIAGNOSIS — Z8612 Personal history of poliomyelitis: Secondary | ICD-10-CM | POA: Diagnosis not present

## 2024-07-25 DIAGNOSIS — G9389 Other specified disorders of brain: Secondary | ICD-10-CM

## 2024-07-25 DIAGNOSIS — K219 Gastro-esophageal reflux disease without esophagitis: Secondary | ICD-10-CM | POA: Diagnosis present

## 2024-07-25 DIAGNOSIS — R296 Repeated falls: Secondary | ICD-10-CM | POA: Diagnosis present

## 2024-07-25 DIAGNOSIS — G936 Cerebral edema: Secondary | ICD-10-CM | POA: Diagnosis present

## 2024-07-25 DIAGNOSIS — Z720 Tobacco use: Secondary | ICD-10-CM | POA: Diagnosis not present

## 2024-07-25 DIAGNOSIS — Z8719 Personal history of other diseases of the digestive system: Secondary | ICD-10-CM

## 2024-07-25 DIAGNOSIS — G939 Disorder of brain, unspecified: Secondary | ICD-10-CM | POA: Diagnosis not present

## 2024-07-25 DIAGNOSIS — R609 Edema, unspecified: Secondary | ICD-10-CM | POA: Diagnosis present

## 2024-07-25 DIAGNOSIS — Z743 Need for continuous supervision: Secondary | ICD-10-CM | POA: Diagnosis not present

## 2024-07-25 DIAGNOSIS — M47812 Spondylosis without myelopathy or radiculopathy, cervical region: Secondary | ICD-10-CM | POA: Diagnosis present

## 2024-07-25 DIAGNOSIS — M4802 Spinal stenosis, cervical region: Secondary | ICD-10-CM | POA: Diagnosis present

## 2024-07-25 DIAGNOSIS — C719 Malignant neoplasm of brain, unspecified: Principal | ICD-10-CM | POA: Diagnosis present

## 2024-07-25 DIAGNOSIS — R531 Weakness: Secondary | ICD-10-CM | POA: Diagnosis present

## 2024-07-25 DIAGNOSIS — F17210 Nicotine dependence, cigarettes, uncomplicated: Secondary | ICD-10-CM | POA: Diagnosis present

## 2024-07-25 DIAGNOSIS — K5730 Diverticulosis of large intestine without perforation or abscess without bleeding: Secondary | ICD-10-CM | POA: Diagnosis present

## 2024-07-25 DIAGNOSIS — R29898 Other symptoms and signs involving the musculoskeletal system: Secondary | ICD-10-CM | POA: Insufficient documentation

## 2024-07-25 DIAGNOSIS — M90552 Osteonecrosis in diseases classified elsewhere, left thigh: Secondary | ICD-10-CM | POA: Diagnosis present

## 2024-07-25 LAB — CBC WITH DIFFERENTIAL/PLATELET
Abs Immature Granulocytes: 0 K/uL (ref 0.00–0.07)
Basophils Absolute: 0 K/uL (ref 0.0–0.1)
Basophils Relative: 0 %
Eosinophils Absolute: 0 K/uL (ref 0.0–0.5)
Eosinophils Relative: 1 %
HCT: 46.7 % (ref 39.0–52.0)
Hemoglobin: 15.5 g/dL (ref 13.0–17.0)
Immature Granulocytes: 0 %
Lymphocytes Relative: 25 %
Lymphs Abs: 1.5 K/uL (ref 0.7–4.0)
MCH: 30.5 pg (ref 26.0–34.0)
MCHC: 33.2 g/dL (ref 30.0–36.0)
MCV: 91.7 fL (ref 80.0–100.0)
Monocytes Absolute: 0.5 K/uL (ref 0.1–1.0)
Monocytes Relative: 9 %
Neutro Abs: 3.7 K/uL (ref 1.7–7.7)
Neutrophils Relative %: 65 %
Platelets: 308 K/uL (ref 150–400)
RBC: 5.09 MIL/uL (ref 4.22–5.81)
RDW: 12.9 % (ref 11.5–15.5)
WBC: 5.7 K/uL (ref 4.0–10.5)
nRBC: 0 % (ref 0.0–0.2)

## 2024-07-25 LAB — COMPREHENSIVE METABOLIC PANEL WITH GFR
ALT: 18 U/L (ref 0–44)
AST: 25 U/L (ref 15–41)
Albumin: 3.8 g/dL (ref 3.5–5.0)
Alkaline Phosphatase: 54 U/L (ref 38–126)
Anion gap: 10 (ref 5–15)
BUN: 11 mg/dL (ref 8–23)
CO2: 26 mmol/L (ref 22–32)
Calcium: 9.4 mg/dL (ref 8.9–10.3)
Chloride: 102 mmol/L (ref 98–111)
Creatinine, Ser: 0.79 mg/dL (ref 0.61–1.24)
GFR, Estimated: >60 mL/min (ref >60)
Glucose, Bld: 113 mg/dL — ABNORMAL HIGH (ref 70–99)
Potassium: 3.8 mmol/L (ref 3.5–5.1)
Sodium: 138 mmol/L (ref 135–145)
Total Bilirubin: 0.9 mg/dL (ref 0.0–1.2)
Total Protein: 7.3 g/dL (ref 6.5–8.1)

## 2024-07-25 MED ORDER — HYDRALAZINE HCL 20 MG/ML IJ SOLN: 5.0000 mg | Freq: Four times a day (QID) | INTRAMUSCULAR | Status: DC | PRN

## 2024-07-25 MED ORDER — LORAZEPAM 2 MG/ML IJ SOLN
1.0000 mg | INTRAMUSCULAR | Status: DC | PRN
Start: 2024-07-25 — End: 2024-07-25

## 2024-07-25 MED ORDER — MIDAZOLAM HCL 2 MG/2ML IJ SOLN: 2.0000 mg | INTRAMUSCULAR | Status: DC | PRN

## 2024-07-25 MED ORDER — NICOTINE 14 MG/24HR TD PT24: 14.0000 mg | MEDICATED_PATCH | Freq: Every day | TRANSDERMAL | Status: DC | PRN

## 2024-07-25 MED ORDER — IOHEXOL 300 MG/ML  SOLN
100.0000 mL | Freq: Once | INTRAMUSCULAR | Status: AC | PRN
  Administered 2024-07-25: 100 mL via INTRAVENOUS

## 2024-07-25 MED ORDER — ACETAMINOPHEN 650 MG RE SUPP: 650.0000 mg | Freq: Four times a day (QID) | RECTAL | Status: DC | PRN

## 2024-07-25 MED ORDER — ACETAMINOPHEN 325 MG PO TABS: 650.0000 mg | ORAL_TABLET | Freq: Four times a day (QID) | ORAL | Status: DC | PRN

## 2024-07-25 MED ORDER — GADOBUTROL 1 MMOL/ML IV SOLN
6.0000 mL | Freq: Once | INTRAVENOUS | Status: AC | PRN
  Administered 2024-07-25: 6 mL via INTRAVENOUS

## 2024-07-25 MED ORDER — IOHEXOL 9 MG/ML PO SOLN
500.0000 mL | ORAL | Status: AC
  Administered 2024-07-25 (×2): 500 mL via ORAL

## 2024-07-25 MED ORDER — ONDANSETRON HCL 4 MG PO TABS
4.0000 mg | ORAL_TABLET | Freq: Four times a day (QID) | ORAL | Status: DC | PRN
Start: 2024-07-25 — End: 2024-07-30

## 2024-07-25 MED ORDER — ONDANSETRON HCL 4 MG/2ML IJ SOLN: 4.0000 mg | Freq: Four times a day (QID) | INTRAMUSCULAR | Status: DC | PRN

## 2024-07-25 MED ORDER — INFLUENZA VIRUS VACC SPLIT PF (FLUZONE) 0.5 ML IM SUSY
0.5000 mL | PREFILLED_SYRINGE | INTRAMUSCULAR | Status: DC
  Filled 2024-07-25: qty 0.5

## 2024-07-25 NOTE — Assessment & Plan Note (Signed)
 Outpatient follow-up with general surgery as appropriate

## 2024-07-25 NOTE — ED Provider Notes (Signed)
 [facility] Provider Note    Event Date/Time   First MD Initiated Contact with Patient 07/25/24 1301     (approximate)   History   Weakness   HPI  Joshua Harmon is a 63 y.o. male who comes in for increasing weakness.  I reviewed a note from 06/10/2024 where patient was seen at [facility] and discharged  I reviewed a note from 05/07/2024 where patient was found to have a brain mass noted on CT imaging and recommended MRI however patient left AGAINST MEDICAL ADVICE  Patient reports that he has had increasing weakness in his legs.  He reports that he has had multiple falls now.  Had an MRI done outpatient he reports being here for a spinal injection.  He reports that he was told that he has a herniated disc and he just needs something to help make his legs not weak.  He denies really any pain.  He just reports that it is a stiffness that is in his legs.  Does have a history of polio with prior multiple surgeries.  I prescribed him gabapentin  at [facility] to use at nighttime but he denies any significant help with this.  I was able to talk to Dr. Marchia who did review and stated that the MRI was overall very reassuring but they had tried to set up a physio appointment for spinal injection but it looks like patient had missed the appointment.  He was also wondering if this could be more neurological in nature so he had recommended a neurology referral.       Physical Exam   Triage Vital Signs: ED Triage Vitals  Encounter Vitals Group     BP 07/25/24 0957 (!) 120/94     Girls Systolic BP Percentile --      Girls Diastolic BP Percentile --      Boys Systolic BP Percentile --      Boys Diastolic BP Percentile --      Pulse Rate 07/25/24 0957 90     Resp 07/25/24 0957 16     Temp 07/25/24 0957 98 F (36.7 C)     Temp Source 07/25/24 0957 Oral     SpO2 07/25/24 0957 96 %     Weight 07/25/24 0959 142 lb (64.4 kg)     Height 07/25/24 0959 6' 1 (1.854 m)     Head  Circumference --      Peak Flow --      Pain Score 07/25/24 0957 7     Pain Loc --      Pain Education --      Exclude from Growth Chart --     Most recent vital signs: Vitals:   07/25/24 0957  BP: (!) 120/94  Pulse: 90  Resp: 16  Temp: 98 F (36.7 C)  SpO2: 96%     General: Awake, no distress.  CV:  Good peripheral perfusion.  Resp:  Normal effort.  Clear lungs Abd:  No distention.  Soft and nontender Other:  Equal strength in both legs.  Sensation intact.  Able to flex and extend the ankles Reports some subjective tingling in his bilateral hands.  ED Results / Procedures / Treatments   Labs (all labs ordered are listed, but only abnormal results are displayed) Labs Reviewed  COMPREHENSIVE METABOLIC PANEL WITH GFR - Abnormal; Notable for the following components:      Result Value   Glucose, Bld 113 (*)    All other components  within normal limits  CBC WITH DIFFERENTIAL/PLATELET    RADIOLOGY I have reviewed the ct personally and interpreted    PROCEDURES:  Critical Care performed: No  Procedures   MEDICATIONS ORDERED IN ED: Medications - No data to display   IMPRESSION / MDM / ASSESSMENT AND PLAN / ED COURSE  I reviewed the triage vital signs and the nursing notes.   Patient's presentation is most consistent with acute presentation with potential threat to life or bodily function.  Patient comes in with continued difficulties with ambulating since June has never had the MRI done to evaluate for brain mass.    CMP reassuring.  CBC reassuring  Given patient's difficulties with ambulation that is been progressively worsening I did discuss with the hospitalist to see if patient can be admitted for expedited neurology workup.  However we will get MRI first to ensure no large brain mass that could be causing patient's progressive difficulties with ambulation.  She also wanted to see if patient could have neurology evaluation done outpatient was going to try  to contact a [facility].  In the meantime patient be handed off to oncoming team of ordered CT imaging to make sure no evidence of intercranial hemorrhage prior to MRI to look for evaluation of this mass and further discussion with patient about possible outpatient versus inpatient workup of this.  I have also sent a message to neurology to see what their thoughts are if patient can get a appointment outpatient with neurology if he would benefit from inpatient evaluation for this.    The patient is on the cardiac monitor to evaluate for evidence of arrhythmia and/or significant heart rate changes.      FINAL CLINICAL IMPRESSION(S) / ED DIAGNOSES   Final diagnoses:  Weakness     Rx / DC Orders   ED Discharge Orders     None        Note:  This document was prepared using Dragon voice recognition software and may include unintentional dictation errors.   Ernest Ronal BRAVO, MD 07/25/24 [phone]

## 2024-07-25 NOTE — Consult Note (Addendum)
 Consult requested by:  Dr. Sherre  Consult requested for:  Brain lesion  Primary Physician:  Patient, No Pcp Per  History of Present Illness: 07/25/2024 Joshua Harmon is here today with a chief complaint of progressive left-sided weakness over the past several weeks to months.  He has been having trouble with falls.  He has had some intermittent confusion.  His fiance reports that his thinking is not quite the same as it used to be.  He was supposed to be evaluated in neurology today, but his fiance became very concerned about his condition and brought him in for evaluation to the emergency department.  He denies headache, nausea, or vomiting.  He is right-handed and works  in a factory driving a Chief Executive Officer. Review of Systems:  A 10 point review of systems is negative, except for the pertinent positives and negatives detailed in the HPI.  Past Medical History: Past Medical History:  Diagnosis Date   GERD (gastroesophageal reflux disease)     Past Surgical History: Past Surgical History:  Procedure Laterality Date   LEG SURGERY Bilateral as a child   XI ROBOTIC ASSISTED INGUINAL HERNIA REPAIR WITH MESH Right 02/19/2020   Procedure: XI ROBOTIC ASSISTED Right INGUINAL HERNIA REPAIR WITH MESH;  Surgeon: Jordis Laneta FALCON, MD;  Location: [facility] ORS;  Service: General;  Laterality: Right;    Allergies: Allergies as of 07/25/2024   (No Known Allergies)    Medications: No outpatient medications have been marked as taking for the 07/25/24 encounter [facility] Encounter).    Social History: Social History   Tobacco Use   Smoking status: Every Day    Current packs/day: 0.25    Types: Cigarettes   Smokeless tobacco: Never  Vaping Use   Vaping status: Never Used  Substance Use Topics   Alcohol use: Not Currently    Alcohol/week: 10.0 standard drinks of alcohol    Types: 10 Cans of beer per week   Drug use: Not Currently    Family Medical History: Family History  Family  history unknown: Yes    Physical Examination: Vitals:   07/25/24 1530 07/25/24 1820  BP: (!) 138/92 (!) 132/101  Pulse: 62 78  Resp: 18 16  Temp:  98.1 F (36.7 C)  SpO2: 100% 97%    General: Patient is in no apparent distress. Attention to examination is appropriate.  Neck:   Supple.  Full range of motion.  Respiratory: Patient is breathing without any difficulty.   NEUROLOGICAL:     Awake, alert, oriented to person, place, and time.  Speech is slow but clear.  Cranial Nerves: Pupils equal round and reactive to light.  Facial tone is symmetric.  Facial sensation is symmetric. Shoulder shrug is symmetric. Tongue protrusion is midline.  There is left pronator drift.  Strength: Side Biceps Triceps Deltoid Interossei Grip Wrist Ext. Wrist Flex.  R 5 5 5 5 5 5 5   L 4 4 4 4 4 4 4    Side Iliopsoas Quads Hamstring PF DF EHL  R 5 5 5 5 5 5   L 4+ 4+ 5 5 5 5    Reflexes are 2+ and symmetric at the biceps, triceps, brachioradialis, patella and achilles.   Hoffman's is absent.   Bilateral upper and lower extremity sensation is intact to light touch.    No evidence of dysmetria noted.  Gait is untested.     Medical Decision Making  Imaging: CT Head 07/25/2024 IMPRESSION: 4.9 x 3.7 cm lesion centered within the  right frontoparietal white matter, as described and increased in size since the head CT of 05/07/2024. This is most suspicious for a mass (such as a high-grade primary CNS neoplasm, metastatic lesion or lymphoma). However, a brain MRI (with and without contrast) is recommended for further characterization. Progressive mass effect with partial effacement of the right lateral ventricle. No midline shift. Ill-defined hyperdensity at the periphery of the lesion which could reflect hypercellularity, mineralization and/or hemorrhage.     Electronically Signed   By: Rockey Childs D.O.   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  This is intimately related with the internal capsule.  I have personally reviewed the images and agree with the above interpretation.  Assessment and Plan: Mr. Rounds is a pleasant 63 y.o. 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  Hold dexamethasone  for now.  He does not have significant swelling at this time.  Would obtain CT chest abdomen pelvis.  We will utilize that information to determine appropriate next steps.  If no primary lesion is identified on his CT chest abdomen pelvis, he will need a biopsy of the intracranial lesion at a minimum.  I have reviewed this with my partner Dr. Janjua and we will transfer to his care if biopsy is required.    I have communicated my recommendations to the requesting physician and coordinated care to facilitate these recommendations.     Devynn Hessler K. Clois MD, [facility] Neurosurgery

## 2024-07-25 NOTE — ED Triage Notes (Addendum)
 Had MRI X2 and told he had bulging disc on back. Reports bilateral leg stiffness, unsteady, and having falls that has gradually gotten worse due to stiffness in legs .   Also c/o intermittent numbness in bilateral hands and [facility].

## 2024-07-25 NOTE — H&P (Signed)
 History and Physical   Joshua Harmon FMW:[id number] DOB: 1961/07/25 DOA: 07/25/2024  PCP: Patient, No Pcp Per  Patient coming from: home   I have personally briefly reviewed patient's old medical records in [facility] EMR.  Chief Concern: unsteadiness, increasing falls  HPI: Mr. 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  Vitals in the ED showed temperature of 97.7, respiration rate 17, heart rate 88, blood pressure 131/96, SpO2 95% on room air.  Serum sodium is 138, potassium 3.8, chloride 102, bicarb 26, BUN of 11, serum creatinine of 0.79, EGFR greater than 60, nonfasting blood glucose 113, WBC 5.7, hemoglobin 15.5, platelets of 308.  CT head wo contrast: Was read as 4.9 x 3.7 cm lesion centered within the right frontoparietal white matter, as described and increased in size since the head CT of 05/07/2024. This is most suspicious for a mass (such as a high-grade primary CNS neoplasm, metastatic lesion or lymphoma). However, a brain MRI (with and without contrast) is recommended for further characterization. Progressive mass effect with partial effacement of the right lateral ventricle. No midline shift. Ill-defined hyperdensity at the periphery of the lesion which could reflect hypercellularity, mineralization and/or hemorrhage.  ED treatment: None. -------------------------------------- At bedside, patient able to tell me his first and last name, age, location, current calendar year.  He reports that in May 2025, he noticed that his legs had become increasingly weak.  He denies trauma to his person.  He reports that he was told at the small mass in his brain in June 2025 in the emergency department however he left because of the increased  cost.  Currently, he has been falling every single day and has been having difficulty lifting his legs.  He reports that his legs feel increasingly stiff.  This prompted him to present to the emergency department for further evaluation.  He denies chest pain, shortness of breath, dysuria, hematuria, numbness, diarrhea, blood in his stool.  He reports unintentional weight loss of approximately 4 to 5 pounds over the last 3 months.  Social history: He lives at home with his significant other, Joshua Harmon.  He denies EtOH and recreational drug use.  He smokes about half a pack of cigarettes per day.  He works in a [employer].  ROS: Constitutional: + weight change, no fever ENT/Mouth: no sore throat, no rhinorrhea Eyes: no eye pain, no vision changes Cardiovascular: no chest pain, no dyspnea,  no edema, no palpitations Respiratory: no cough, no sputum, no wheezing Gastrointestinal: no nausea, no vomiting, no diarrhea, no constipation Genitourinary: no urinary incontinence, no dysuria, no hematuria Musculoskeletal: no arthralgias, no myalgias Skin: no skin lesions, no pruritus, Neuro: + weakness, no loss of consciousness, no syncope, + both leg stiffness Psych: no anxiety, no depression, no decrease appetite Heme/Lymph: no bruising, no bleeding  ED Course: Cussed with EDP, patient requiring hospitalization for chief concerns of brain mass.  Assessment/Plan  Principal Problem:   Brain mass Active Problems:   History of hernia repair   Nicotine  use   Leg weakness, bilateral   Assessment and Plan:  * Brain mass Neurosurgery has been consulted MRI of the brain, cervical spine, thoracic spine with and without contrast is pending. Neurosurgery recommends CT chest abdomen pelvis with contrast which has been placed. Admit to  telemetry medical, inpatient  Leg weakness, bilateral Suspect secondary to intracranial mass PT, OT not indicated at this time  Nicotine  use As needed nicotine  patch  ordered  History of hernia repair Outpatient follow-up with general surgery as appropriate  Chart reviewed.   DVT prophylaxis: Pharmacologic DVT not initiated on admission.  AM team to initiate pharmacologic DVT when the benefits outweigh the risk.  Code Status: Full code Diet: Regular diet Family Communication: Updated significant other 5 years, Joshua Harmon at bedside with patient's permission Disposition Plan: Pending clinical course Consults called: Neurosurgery Admission status: Telemetry medical, inpatient  Past Medical History:  Diagnosis Date   GERD (gastroesophageal reflux disease)    Past Surgical History:  Procedure Laterality Date   LEG SURGERY Bilateral as a child   XI ROBOTIC ASSISTED INGUINAL HERNIA REPAIR WITH MESH Right 02/19/2020   Procedure: XI ROBOTIC ASSISTED Right INGUINAL HERNIA REPAIR WITH MESH;  Surgeon: Joshua Laneta FALCON, MD;  Location: [facility] ORS;  Service: General;  Laterality: Right;   Social History:  reports that he has been smoking cigarettes. He has never used smokeless tobacco. He reports that he does not currently use alcohol after a past usage of about 10.0 standard drinks of alcohol per week. He reports that he does not currently use drugs.  No Known Allergies Family History  Family history unknown: Yes   Family history: Patient reports he does not know of any family medical diagnosis.  Prior to Admission medications   Medication Sig Start Date End Date Taking? Authorizing Provider  HYDROcodone -acetaminophen  (NORCO/VICODIN) 5-325 MG tablet Take 1-2 tablets by mouth every 4 (four) hours as needed for moderate pain. 02/19/20   Harmon, Joshua F, MD  polyethylene glycol (MIRALAX  / GLYCOLAX ) 17 g packet Take 17 g by mouth daily. Mix one tablespoon with 8oz of your favorite juice or water every day until you are having soft formed stools. Then start taking once daily if you didn't have a stool the day before. Patient not taking: Reported on 02/19/2020 01/29/20    Joshua Dover, MD    Physical Exam: Vitals:   07/25/24 1430 07/25/24 1500 07/25/24 1530 07/25/24 1820  BP: (!) 123/95 (!) 124/95 (!) 138/92 (!) 132/101  Pulse: 76 64 62 78  Resp:   18 16  Temp:    98.1 F (36.7 C)  TempSrc:      SpO2: 99% 97% 100% 97%  Weight:      Height:       Constitutional: appears older than chronological age, frail, cachectic, anxious Eyes: PERRL, lids and conjunctivae normal ENMT: Mucous membranes are moist. Posterior pharynx clear of any exudate or lesions. Age-appropriate dentition. Hearing appropriate Neck: normal, supple, no masses, no thyromegaly Respiratory: clear to auscultation bilaterally, no wheezing, no crackles. Normal respiratory effort. No accessory muscle use.  Cardiovascular: Regular rate and rhythm, no murmurs / rubs / gallops. No extremity edema. 2+ pedal pulses. No carotid bruits.  Abdomen: Scaphoid abdomen, no tenderness, no masses palpated, no hepatosplenomegaly. Bowel sounds positive.  Musculoskeletal: no clubbing / cyanosis. No joint deformity upper and lower extremities. Good ROM, no contractures, no atrophy. Normal muscle tone.  Skin: no rashes, lesions, ulcers. No induration Neurologic: Sensation intact. Strength 5/5 in all 4.  Psychiatric: Lacks judgment and insight. Alert and oriented x 3. Normal mood.   EKG: Not indicated at this time  Chest x-ray on Admission: I personally reviewed and I agree with radiologist reading as below.  CT HEAD WO CONTRAST ( ) Result Date: 07/25/2024 CLINICAL DATA:  Provided history: Headache, new onset. EXAM: CT HEAD WITHOUT CONTRAST TECHNIQUE: Contiguous axial images were obtained from the base of the skull through the vertex without intravenous contrast. RADIATION DOSE REDUCTION: This exam was performed according to the departmental dose-optimization program which includes automated exposure control, adjustment of the mA and/or kV according to patient size and/or use of iterative reconstruction  technique. COMPARISON:  Head CT 05/07/2024. FINDINGS: Brain: Since the head CT of 05/07/2024, interval increase in size of a lesion centered within the right frontoparietal white matter, now measuring 4.9 x 3.7 cm (for instance as seen on series 5, image 23) (series 2, image 20). The lesion also appears to extend to involve the callosal body (for instance as seen on series 4, image 36). This is most suspicious for a mass. Centrally, the lesion is hypodense. There is ill-defined hyperdensity along the which could reflect hypercellularity, mineralization and/or associated hemorrhage. Progressive mass effect with partial effacement of the right lateral ventricle. No midline shift. No demarcated cortical infarct. No extra-axial fluid collection. Vascular: No hyperdense vessel.  Atherosclerotic calcifications. Skull: No calvarial fracture or aggressive osseous lesion. Visible sinuses/orbits: No mass or acute finding within the imaged orbits. Chronic medially displaced fracture deformity of the right lamina papyracea. Mild mucosal thickening within the right frontal sinus inferiorly. IMPRESSION: 4.9 x 3.7 cm lesion centered within the right frontoparietal white matter, as described and increased in size since the head CT of 05/07/2024. This is most suspicious for a mass (such as a high-grade primary CNS neoplasm, metastatic lesion or lymphoma). However, a brain MRI (with and without contrast) is recommended for further characterization. Progressive mass effect with partial effacement of the right lateral ventricle. No midline shift. Ill-defined hyperdensity at the periphery of the lesion which could reflect hypercellularity, mineralization and/or hemorrhage. Electronically Signed   By: Rockey Childs D.O.   On: 07/25/2024 16:20   Labs on Admission: I have personally reviewed following labs  CBC: Recent Labs  Lab 07/25/24 1002  WBC 5.7  NEUTROABS 3.7  HGB 15.5  HCT 46.7  MCV 91.7  PLT 308   Basic Metabolic  Panel: Recent Labs  Lab 07/25/24 1002  NA 138  K 3.8  CL 102  CO2 26  GLUCOSE 113*  BUN 11  CREATININE 0.79  CALCIUM 9.4   GFR: Estimated Creatinine Clearance: 88.3 mL/min (by C-G formula based on SCr of 0.79 mg/dL). Liver Function Tests: Recent Labs  Lab 07/25/24 1002  AST 25  ALT 18  ALKPHOS 54  BILITOT 0.9  PROT 7.3  ALBUMIN 3.8   This document was prepared using Dragon Voice Recognition software and may include unintentional dictation errors.  Dr. Sherre [facility]  If 7PM-7AM, please contact overnight-coverage provider If 7AM-7PM, please contact day attending provider [email or web address]  07/25/2024, 6:36 PM

## 2024-07-25 NOTE — Hospital Course (Signed)
 Mr. 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  Vitals in the ED showed temperature of 97.7, respiration rate 17, heart rate 88, blood pressure 131/96, SpO2 95% on room air.  Serum sodium is 138, potassium 3.8, chloride 102, bicarb 26, BUN of 11, serum creatinine of 0.79, EGFR greater than 60, nonfasting blood glucose 113, WBC 5.7, hemoglobin 15.5, platelets of 308.  CT head wo contrast: Was read as 4.9 x 3.7 cm lesion centered within the right frontoparietal white matter, as described and increased in size since the head CT of 05/07/2024. This is most suspicious for a mass (such as a high-grade primary CNS neoplasm, metastatic lesion or lymphoma). However, a brain MRI (with and without contrast) is recommended for further characterization. Progressive mass effect with partial effacement of the right lateral ventricle. No midline shift. Ill-defined hyperdensity at the periphery of the lesion which could reflect hypercellularity, mineralization and/or hemorrhage.  ED treatment: None.

## 2024-07-25 NOTE — Assessment & Plan Note (Addendum)
 Suspect secondary to intracranial mass PT, OT not indicated at this time

## 2024-07-25 NOTE — Assessment & Plan Note (Addendum)
 Neurosurgery has been consulted MRI of the brain, cervical spine, thoracic spine with and without contrast is pending. Neurosurgery recommends CT chest abdomen pelvis with contrast which has been placed. Admit to telemetry medical, inpatient

## 2024-07-25 NOTE — ED Notes (Signed)
 Pt remains in MRI, pt to be transported to the floor from MRI,

## 2024-07-25 NOTE — Assessment & Plan Note (Signed)
 -  As needed nicotine patch ordered ?

## 2024-07-26 ENCOUNTER — Telehealth: Payer: Self-pay

## 2024-07-26 ENCOUNTER — Inpatient Hospital Stay (HOSPITAL_COMMUNITY)
Admission: EM | Admit: 2024-07-26 | Discharge: 2024-08-03 | DRG: 026 | Disposition: A | Discharge status: Rehabilitation Facility | Source: Other Acute Inpatient Hospital | Attending: Family Medicine | Admitting: Family Medicine

## 2024-07-26 ENCOUNTER — Other Ambulatory Visit: Payer: Self-pay

## 2024-07-26 ENCOUNTER — Encounter (HOSPITAL_COMMUNITY): Payer: Self-pay

## 2024-07-26 DIAGNOSIS — M47812 Spondylosis without myelopathy or radiculopathy, cervical region: Secondary | ICD-10-CM | POA: Diagnosis present

## 2024-07-26 DIAGNOSIS — R531 Weakness: Secondary | ICD-10-CM | POA: Diagnosis present

## 2024-07-26 DIAGNOSIS — D72829 Elevated white blood cell count, unspecified: Secondary | ICD-10-CM | POA: Diagnosis not present

## 2024-07-26 DIAGNOSIS — M4802 Spinal stenosis, cervical region: Secondary | ICD-10-CM | POA: Diagnosis present

## 2024-07-26 DIAGNOSIS — Z5982 Transportation insecurity: Secondary | ICD-10-CM

## 2024-07-26 DIAGNOSIS — D496 Neoplasm of unspecified behavior of brain: Secondary | ICD-10-CM | POA: Diagnosis not present

## 2024-07-26 DIAGNOSIS — R609 Edema, unspecified: Secondary | ICD-10-CM | POA: Diagnosis present

## 2024-07-26 DIAGNOSIS — R270 Ataxia, unspecified: Secondary | ICD-10-CM | POA: Diagnosis present

## 2024-07-26 DIAGNOSIS — K5900 Constipation, unspecified: Secondary | ICD-10-CM | POA: Diagnosis present

## 2024-07-26 DIAGNOSIS — F17210 Nicotine dependence, cigarettes, uncomplicated: Secondary | ICD-10-CM | POA: Diagnosis present

## 2024-07-26 DIAGNOSIS — R296 Repeated falls: Secondary | ICD-10-CM | POA: Diagnosis present

## 2024-07-26 DIAGNOSIS — G9389 Other specified disorders of brain: Principal | ICD-10-CM

## 2024-07-26 DIAGNOSIS — R414 Neurologic neglect syndrome: Secondary | ICD-10-CM | POA: Diagnosis not present

## 2024-07-26 DIAGNOSIS — Z8612 Personal history of poliomyelitis: Secondary | ICD-10-CM

## 2024-07-26 DIAGNOSIS — C719 Malignant neoplasm of brain, unspecified: Secondary | ICD-10-CM | POA: Diagnosis present

## 2024-07-26 DIAGNOSIS — K219 Gastro-esophageal reflux disease without esophagitis: Secondary | ICD-10-CM | POA: Diagnosis present

## 2024-07-26 DIAGNOSIS — Z743 Need for continuous supervision: Secondary | ICD-10-CM | POA: Diagnosis not present

## 2024-07-26 DIAGNOSIS — Z720 Tobacco use: Secondary | ICD-10-CM | POA: Diagnosis not present

## 2024-07-26 DIAGNOSIS — K5730 Diverticulosis of large intestine without perforation or abscess without bleeding: Secondary | ICD-10-CM | POA: Diagnosis present

## 2024-07-26 DIAGNOSIS — T380X5A Adverse effect of glucocorticoids and synthetic analogues, initial encounter: Secondary | ICD-10-CM | POA: Diagnosis present

## 2024-07-26 DIAGNOSIS — R5381 Other malaise: Secondary | ICD-10-CM | POA: Diagnosis present

## 2024-07-26 DIAGNOSIS — M879 Osteonecrosis, unspecified: Secondary | ICD-10-CM | POA: Diagnosis present

## 2024-07-26 DIAGNOSIS — K5901 Slow transit constipation: Secondary | ICD-10-CM | POA: Diagnosis not present

## 2024-07-26 DIAGNOSIS — C711 Malignant neoplasm of frontal lobe: Secondary | ICD-10-CM | POA: Diagnosis not present

## 2024-07-26 DIAGNOSIS — G939 Disorder of brain, unspecified: Secondary | ICD-10-CM | POA: Diagnosis not present

## 2024-07-26 LAB — CBC
HCT: 43.9 % (ref 39.0–52.0)
Hemoglobin: 14.9 g/dL (ref 13.0–17.0)
MCH: 31 pg (ref 26.0–34.0)
MCHC: 33.9 g/dL (ref 30.0–36.0)
MCV: 91.5 fL (ref 80.0–100.0)
Platelets: 299 K/uL (ref 150–400)
RBC: 4.8 MIL/uL (ref 4.22–5.81)
RDW: 12.9 % (ref 11.5–15.5)
WBC: 5.9 K/uL (ref 4.0–10.5)
nRBC: 0 % (ref 0.0–0.2)

## 2024-07-26 LAB — BASIC METABOLIC PANEL WITH GFR
Anion gap: 10 (ref 5–15)
BUN: 9 mg/dL (ref 8–23)
CO2: 26 mmol/L (ref 22–32)
Calcium: 8.9 mg/dL (ref 8.9–10.3)
Chloride: 101 mmol/L (ref 98–111)
Creatinine, Ser: 0.72 mg/dL (ref 0.61–1.24)
GFR, Estimated: >60 mL/min (ref >60)
Glucose, Bld: 100 mg/dL — ABNORMAL HIGH (ref 70–99)
Potassium: 4.1 mmol/L (ref 3.5–5.1)
Sodium: 137 mmol/L (ref 135–145)

## 2024-07-26 LAB — HIV ANTIBODY (ROUTINE TESTING W REFLEX): HIV Screen 4th Generation wRfx: NONREACTIVE

## 2024-07-26 MED ORDER — ONDANSETRON HCL 4 MG/2ML IJ SOLN: 4.0000 mg | Freq: Four times a day (QID) | INTRAMUSCULAR | Status: DC | PRN

## 2024-07-26 MED ORDER — ONDANSETRON HCL 4 MG PO TABS: 4.0000 mg | ORAL_TABLET | Freq: Four times a day (QID) | ORAL | Status: DC | PRN

## 2024-07-26 MED ORDER — ACETAMINOPHEN 325 MG PO TABS
650.0000 mg | ORAL_TABLET | Freq: Four times a day (QID) | ORAL | Status: DC | PRN
  Administered 2024-07-30: 650 mg via ORAL
  Filled 2024-07-26: qty 2

## 2024-07-26 MED ORDER — ACETAMINOPHEN 650 MG RE SUPP: 650.0000 mg | Freq: Four times a day (QID) | RECTAL | Status: DC | PRN

## 2024-07-26 NOTE — Plan of Care (Signed)
 Pt is being transferred to [facility] to 3W room 32. Report has been called to the appropriate nurse. Waiting for [facility] to transport patient.  Problem: Education: Goal: Knowledge of General Education information will improve Description: Including pain rating scale, medication(s)/side effects and non-pharmacologic comfort measures Outcome: Progressing   Problem: Health Behavior/Discharge Planning: Goal: Ability to manage health-related needs will improve Outcome: Progressing   Problem: Clinical Measurements: Goal: Diagnostic test results will improve Outcome: Progressing Goal: Respiratory complications will improve Outcome: Progressing   Problem: Activity: Goal: Risk for activity intolerance will decrease Outcome: Progressing

## 2024-07-26 NOTE — Progress Notes (Signed)
 Report was called ad given to the nurse responsible for the patient at [name]. Patient will be transferred to room 32.

## 2024-07-26 NOTE — Discharge Summary (Signed)
 Physician Discharge Summary   Patient: Joshua Harmon MRN: [id number] DOB: 05/02/1961  Admit date:     07/25/2024  Discharge date: 07/26/24  Discharge Physician: Laree Lock   PCP: Patient, No Pcp Per   Recommendations at discharge:   Transfer to [facility] for further management  Discharge Diagnoses: Principal Problem:   Brain mass Active Problems:   History of hernia repair   Nicotine  use   Leg weakness, bilateral  Resolved Problems:   * No resolved [facility] problems. [facility]: Mr. Messiah Ahr is a 63 year old male with history of polio as a child, history of right unilateral inguinal hernia status post robotic assisted repair with mesh, who presents to the emergency department for chief concerns of leg stiffness, unsteadiness, increased falling over the last week. Admitted for work up for brain mass, Seen by Neurosurgery. Transfer to [facility] for biopdy by Dr.Janjua  Assessment and Plan: Brain mass - CT head 4.9 x 3.7 cm lesion centered within the right frontoparietal white matter.  Suspicious for a mass such as high-grade primary CNS neoplasm/metastatic lesion/lymphoma. - CT chest abdomen/pelvis with no evidence of primary malignancy/metastatic disease. - MRI brain peripheral enhancing mass in right frontotemporal lobe 4.5 x 4.5 x 2.9 cm with mild associated peritumoral edema.  Causing mass effect without significant midline shift. -MRI C-spine cervical spondylosis, multifactorial severe spinal canal stenosis with bilateral neural foraminal narrowing. - MRI T-spine negative - No indication for dexamethasone  - Seen by neurosurgery, appreciate recs. 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   Consultants: Neurosurgery Procedures performed: None  Disposition: Transfer to [facility] Diet recommendation:  Discharge Diet Orders (From admission, onward)     Start     Ordered   07/26/24 0000  Diet - low sodium heart healthy        07/26/24 0936            DISCHARGE MEDICATION: Allergies as of 07/26/2024   No Known Allergies      Medication List     STOP taking these medications    gabapentin  300 MG capsule Commonly known as: NEURONTIN    HYDROcodone -acetaminophen  5-325 MG tablet Commonly known as: NORCO/VICODIN       TAKE these medications    polyethylene glycol 17 g packet Commonly known as: MIRALAX  / GLYCOLAX  Take 17 g by mouth daily. Mix one tablespoon with 8oz of your favorite juice or water every day until you are having soft formed stools. Then start taking once daily if you didn't have a stool the day before.        Follow-up Information     Schedule an appointment as soon as possible for a visit  with Potter, Zachary E, MD.   Specialty: Neurology Contact information: [phone] [facility] [address] [facility] [place] [address] [phone]                Discharge Exam: Filed Weights   07/25/24 0959  Weight: 64.4 kg   Constitutional: frail, cachectic, anxious Neck: normal, supple, no masses, no thyromegaly Respiratory: clear to auscultation bilaterally, no wheezing, no crackles. Normal respiratory effort. No accessory muscle use.  Cardiovascular: Regular rate and rhythm, no murmurs / rubs / gallops. No extremity edema. 2+ pedal pulses.  No carotid bruits.  Abdomen: Scaphoid abdomen, no tenderness, no masses palpated, no hepatosplenomegaly. Bowel sounds positive.  Musculoskeletal: no clubbing / cyanosis. No joint deformity upper and lower extremities. Good ROM, no contractures, no atrophy. Normal muscle tone.  Skin: no rashes, lesions, ulcers. No induration Neurologic: Sensation intact. Strength 5/5 in all  4.   Condition at discharge: fair  The results of significant diagnostics from this hospitalization (including imaging, microbiology, ancillary and laboratory) are listed below for reference.   Imaging Studies: CT CHEST ABDOMEN PELVIS W CONTRAST Result Date: 07/25/2024 CLINICAL DATA:  Brain mass, assess for metastatic disease. EXAM: CT CHEST, ABDOMEN, AND PELVIS WITH CONTRAST TECHNIQUE: Multidetector CT imaging of the chest, abdomen and pelvis was performed following the standard protocol during bolus administration of intravenous contrast. RADIATION DOSE REDUCTION: This exam was performed according to the departmental dose-optimization program which includes automated exposure control, adjustment of the mA and/or kV according to patient size and/or use of iterative reconstruction technique. CONTRAST:  OMNIPAQUE  IOHEXOL  300 MG/ML  SOLN COMPARISON:  Abdominopelvic CT 01/29/2020 FINDINGS: CT CHEST FINDINGS Cardiovascular: Normal heart size. No pericardial effusion. Minimal aortic atherosclerosis without aneurysm. Mediastinum/Nodes: No mediastinal or hilar adenopathy. Small hiatal hernia. No esophageal wall thickening. No suspicious thyroid nodule. Lungs/Pleura: Minimal emphysema. No pulmonary nodule or mass. Mild dependent atelectasis. No pleural effusion or thickening. The trachea and central airways are clear. Musculoskeletal: No evidence of lytic or blastic osseous lesion. Upper thoracic scoliosis. No chest wall soft tissue abnormalities. CT ABDOMEN PELVIS FINDINGS Hepatobiliary: No focal liver abnormality is seen. No gallstones, gallbladder wall thickening, or biliary dilatation. Pancreas: No evidence of pancreatic mass. No ductal dilatation or inflammation. Spleen: Normal in size without focal abnormality. Adrenals/Urinary Tract: No adrenal nodule. No renal mass or renal calculi. No hydronephrosis. No bladder wall thickening or evidence of bladder mass. Stomach/Bowel: Small hiatal hernia. There is no  gastric wall thickening. No evidence of small bowel mass, obstruction or inflammation. Moderate volume of stool in the colon. No obvious colonic mass. Distal descending and sigmoid colonic diverticulosis without diverticulitis. Vascular/Lymphatic: Normal caliber abdominal aorta with mild atherosclerosis. Portal vein is patent. No suspicious lymphadenopathy. Reproductive: Prostate is unremarkable. Other: Right inguinal hernia repair. No ascites. No omental thickening. No subcutaneous lesion. Musculoskeletal: Peripherally sclerotic lucency within the left iliac bone is unchanged from 2021 and considered benign. No suspicious bone lesion. Multilevel degenerative change in the spine. 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 IMPRESSION: 1. No evidence of primary malignancy or metastatic disease in the chest, abdomen, or pelvis. 2. Small hiatal hernia. Colonic diverticulosis without diverticulitis. 3. 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 Aortic Atherosclerosis (ICD10-I70.0) and Emphysema (ICD10-J43.9). Electronically Signed   By: Andrea Gasman M.D.   On: 07/25/2024 23:08   MR Cervical Spine W and Wo Contrast Result Date: 07/25/2024 CLINICAL DATA:  Provided history: Ataxia, nontraumatic, cervical pathology suspected. EXAM: MRI CERVICAL SPINE WITHOUT AND WITH CONTRAST TECHNIQUE: Multiplanar and multiecho pulse sequences of the cervical spine, to include the craniocervical junction and cervicothoracic junction, were obtained without and with intravenous contrast. CONTRAST:  6mL GADAVIST  GADOBUTROL  1 MMOL/ML IV SOLN COMPARISON:  None. FINDINGS: Alignment: Levocurvature of the cervical spine. Nonspecific straightening of the expected cervical lordosis. 4 mm C3-C4 grade 1 anterolisthesis. Slight C4-C5 grade 1 anterolisthesis. 5 mm C7-T1 grade 1 anterolisthesis. Vertebrae: Multilevel degenerative endplate irregularity. Mild degenerative endplate edema at R5-R4, C5-C6 and C6-C7.  Small hemangioma within the C3 vertebral body. Facet ankylosis on the left  at C2-C3 and on the left at C7-T1. Multifocal marrow edema and enhancement within the posterior elements, likely degenerative and related to facet arthropathy Cord: No signal abnormality identified within the cervical spinal cord. No pathologic spinal cord enhancement. Multilevel spinal cord flattening as described below. Posterior Fossa, vertebral arteries, paraspinal tissues: Posterior fossa assessed on same-day brain MRI. Flow voids preserved within visible portions of the cervical vertebral arteries. No paraspinal mass or collection. Nonspecific ill-defined edema and enhancement within the paraspinal soft tissues on the right at the C4-C7 levels. Disc levels: Multilevel disc degeneration, greatest at C4-C5, C5-C6 and C6-C7 (moderate-to-advanced at these levels). Also of note, disc degeneration is moderate at C7-T1. Developmentally narrow cervical spinal canal due to short pedicles. C2-C3: Facet ankylosis on the left. No significant disc herniation or stenosis. C3-C4: Grade 1 anterolisthesis. Uncovertebral hypertrophy on the right. Advanced facet arthropathy on the right. Mild ligamentum flavum thickening. Mild spinal canal stenosis. Severe right neural foraminal narrowing. C4-C5: Grade 1 anterolisthesis. Posterior disc osteophyte complex with bilateral disc osteophyte ridge/uncinate hypertrophy. Moderate facet arthropathy (greater on the left). Ligamentum flavum thickening. Severe spinal canal stenosis with spinal cord flattening. Severe bilateral neural foraminal narrowing. C5-C6: Posterior disc osteophyte complex with bilateral disc osteophyte ridge/uncinate hypertrophy. Facet arthropathy (greater on the right and moderate-to-advanced on the right). Mild ligamentum flavum thickening. Mild spinal canal stenosis. Moderate-to-severe bilateral neural foraminal narrowing (greater on the left). C6-C7: Posterior disc osteophyte complex with  bilateral disc osteophyte ridge/uncinate hypertrophy. Mild facet arthropathy. No significant spinal canal stenosis. Bilateral neural foraminal narrowing (mild-to-moderate right, moderate-to-severe left). C7-T1: Grade 1 anterolisthesis. Shallow disc bulge. Moderate facet arthropathy on the right. Facet ankylosis on the left. Mild ligamentum flavum thickening. Mild spinal canal stenosis. The disc bulge slightly flattens the ventral aspect of the spinal cord. Severe bilateral neural foraminal narrowing. IMPRESSION: 1. Cervical spondylosis as outlined within the body of the report. 2. At C4-C5, there is multifactorial severe spinal canal stenosis (with spinal cord flattening). Severe bilateral neural foraminal narrowing. 3. No more than mild spinal canal stenosis at the remaining levels. 4. Additional sites of foraminal stenosis, greatest on the right at C3-C4 (severe), bilaterally at C5-C6 (moderate-to-severe), on the left at C6-C7 (moderate-to-severe) and bilaterally at C7-T1 (severe). 5. Disc degeneration is greatest at C4-C5, C5-C6 and C6-C7 (moderate-to-advanced in severity with mild degenerative endplate edema at these levels). 6. Grade 1 anterolisthesis at C3-C4, C4-C5 and C7-T1. 7. Levocurvature of the cervical spine. 8. Multifocal marrow edema and enhancement within the posterior elements, likely degenerative and related to facet arthropathy. 9. Facet ankylosis on the left at C2-C3 and on the left at C7-T1. 10. Nonspecific edema and enhancement within the right paraspinal soft tissues at the C4-C7 levels. Electronically Signed   By: Rockey Childs D.O.   On: 07/25/2024 19:32   MR THORACIC SPINE W WO CONTRAST Result Date: 07/25/2024 EXAM: MRI THORACIC SPINE WITH AND WITHOUT INTRAVENOUS CONTRAST 07/25/2024 05:42:39 PM TECHNIQUE: Multiplanar multisequence MRI of the thoracic spine was performed with and without the administration of intravenous contrast. COMPARISON: None available. CLINICAL HISTORY: Bone lesion,  thoracic spine, incidental. Patient reports increasing weakness in his legs, multiple falls, and stiffness in his legs. History of polio with prior multiple surgeries. Prescribed gabapentin  with no significant help. FINDINGS: BONES AND ALIGNMENT: Mild thoracic levoscoliosis. Normal vertebral body heights. Bone marrow signal is unremarkable. No abnormal enhancement. SPINAL CORD: Normal spinal cord volume. Normal spinal cord signal. SOFT TISSUES: Unremarkable. DEGENERATIVE CHANGES: Minimal degenerative disc disease. No spinal canal stenosis  or neural foraminal narrowing. IMPRESSION: 1. No spinal canal stenosis or neural foraminal narrowing in the thoracic spine. 2. Mild thoracic levoscoliosis. Electronically signed by: Franky Stanford MD 07/25/2024 07:13 PM EDT RP Workstation: [id number]   MR Brain W and Wo Contrast Result Date: 07/25/2024 EXAM: MRI BRAIN WITH AND WITHOUT CONTRAST 07/25/2024 05:42:39 PM TECHNIQUE: Multiplanar multisequence MRI of the head/brain was performed with and without the administration of intravenous contrast. COMPARISON: CT head 06/06/2024 CLINICAL HISTORY: Headache, increasing frequency or severity. Patient reports increasing weakness in his legs and multiple falls. History of polio with prior multiple surgeries. FINDINGS: BRAIN AND VENTRICLES: There is a peripheral enhancing mass centered in the right frontoparietal white matter involving the centrum semiovale and extending into the periventricular white matter and corona radiata. The mass measures 4.5 x 4.5 x 2.9 cm. The mass abuts the ependymal surface of the right lateral ventricle with associated mass effect on the ventricle. Mild diffusion restriction. There is associated susceptibility along the periphery of the mass. Surrounding T2/FLAIR hyperintensity suggestive of peritumoral edema which involves the posterior aspect of the right centrum semiovale and the right periventricular white matter. Additional T2/FLAIR hyperintensity in the  periventricular and subcortical white matter suggestive of chronic microvascular ischemic changes. There is mild cerebral volume loss. No significant midline shift. No evidence of acute infarct. ORBITS: No acute abnormality. SINUSES: Mild mucosal thickening in the left maxillary sinus. Chronic deformity of the right lamina papyracea. BONES AND SOFT TISSUES: Normal bone marrow signal and enhancement. No acute soft tissue abnormality. IMPRESSION: 1. Peripheral enhancing mass in the right frontoparietal lobes measuring 4.5 x 4.5 x 2.9 cm, with mild associated peritumoral edema. The mass abuts the ependymal surface of the right lateral ventricle, causing mass effect without significant midline shift. 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 2. Additional T2/FLAIR hyperintensity in the periventricular and subcortical white matter, suggestive of chronic microvascular ischemic changes. 3. No acute infarct. Electronically signed by: Donnice Mania MD 07/25/2024 06:59 PM EDT RP Workstation: [id number]   CT HEAD WO CONTRAST ( ) Result Date: 07/25/2024 CLINICAL DATA:  Provided history: Headache, new onset. EXAM: CT HEAD WITHOUT CONTRAST TECHNIQUE: Contiguous axial images were obtained from the base of the skull through the vertex without intravenous contrast. RADIATION DOSE REDUCTION: This exam was performed according to the departmental dose-optimization program which includes automated exposure control, adjustment of the mA and/or kV according to patient size and/or use of iterative reconstruction technique. COMPARISON:  Head CT 05/07/2024. FINDINGS: Brain: Since the head CT of 05/07/2024, interval increase in size of a lesion centered within the right frontoparietal white matter, now measuring 4.9 x 3.7 cm (for instance as seen on series 5, image 23) (series 2, image 20). The lesion also appears to extend to involve the callosal body (for instance as seen on series 4, image 36). This is  most suspicious for a mass. Centrally, the lesion is hypodense. There is ill-defined hyperdensity along the which could reflect hypercellularity, mineralization and/or associated hemorrhage. Progressive mass effect with partial effacement of the right lateral ventricle. No midline shift. No demarcated cortical infarct. No extra-axial fluid collection. Vascular: No hyperdense vessel.  Atherosclerotic calcifications. Skull: No calvarial fracture or aggressive osseous lesion. Visible sinuses/orbits: No mass or acute finding within the imaged orbits. Chronic medially displaced fracture deformity of the right lamina papyracea. Mild mucosal thickening within the right frontal sinus inferiorly. IMPRESSION: 4.9 x 3.7 cm lesion centered within the right frontoparietal white matter, as described and increased in size since  the head CT of 05/07/2024. This is most suspicious for a mass (such as a high-grade primary CNS neoplasm, metastatic lesion or lymphoma). However, a brain MRI (with and without contrast) is recommended for further characterization. Progressive mass effect with partial effacement of the right lateral ventricle. No midline shift. Ill-defined hyperdensity at the periphery of the lesion which could reflect hypercellularity, mineralization and/or hemorrhage. Electronically Signed   By: Rockey Childs D.O.   On: 07/25/2024 16:20    Microbiology: Results for orders placed or performed during the [facility] encounter of 02/15/20  SARS CORONAVIRUS 2 (TAT 6-24 HRS) Nasopharyngeal Nasopharyngeal Swab     Status: None   Collection Time: 02/15/20  1:21 PM   Specimen: Nasopharyngeal Swab  Result Value Ref Range Status   SARS Coronavirus 2 NEGATIVE NEGATIVE Final    Comment: (NOTE) SARS-CoV-2 target nucleic acids are NOT DETECTED. The SARS-CoV-2 RNA is generally detectable in upper and lower respiratory specimens during the acute phase of infection. Negative results do not preclude SARS-CoV-2 infection, do not  rule out co-infections with other pathogens, and should not be used as the sole basis for treatment or other patient management decisions. Negative results must be combined with clinical observations, patient history, and epidemiological information. The expected result is Negative. Fact Sheet for Patients: HairSlick.no Fact Sheet for Healthcare Providers: quierodirigir.com This test is not yet approved or cleared by the [place]  FDA and  has been authorized for detection and/or diagnosis of SARS-CoV-2 by FDA under an Emergency Use Authorization (EUA). This EUA will remain  in effect (meaning this test can be used) for the duration of the COVID-19 declaration under Section 56 4(b)(1) of the Act, 21 U.S.C. section 360bbb-3(b)(1), unless the authorization is terminated or revoked sooner. Performed at [facility], [address], [place], [place] [address]     Labs: CBC: Recent Labs  Lab 07/25/24 1002 07/26/24 0445  WBC 5.7 5.9  NEUTROABS 3.7  --   HGB 15.5 14.9  HCT 46.7 43.9  MCV 91.7 91.5  PLT 308 299   Basic Metabolic Panel: Recent Labs  Lab 07/25/24 1002 07/26/24 0445  NA 138 137  K 3.8 4.1  CL 102 101  CO2 26 26  GLUCOSE 113* 100*  BUN 11 9  CREATININE 0.79 0.72  CALCIUM 9.4 8.9   Liver Function Tests: Recent Labs  Lab 07/25/24 1002  AST 25  ALT 18  ALKPHOS 54  BILITOT 0.9  PROT 7.3  ALBUMIN 3.8   CBG: No results for input(s): GLUCAP in the last 168 hours.  Discharge time spent: greater than 30 minutes.  Signed: Laree Lock, MD [facility] 07/26/2024

## 2024-07-26 NOTE — Progress Notes (Signed)
 Patient discharged to [facility] 3 Room 32 via EMS with all belongings. All care transferred.

## 2024-07-26 NOTE — H&P (Signed)
 [facility] History and Physical  Joshua Harmon FMW:[id number] DOB: [birth year] DOA: 07/26/2024   PCP: Patient, No Pcp Per  Specialists: None  Chief Complaint: Lower extremity weakness  HPI: Joshua Harmon is a 63 y.o. 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  This has been ongoing for several weeks.  Worse over the last 1 week.  Denies any headache nausea vomiting.  No weight loss.  No shortness of breath chest pain.  He has been holding onto the furniture in his house to get around.  Has not had any significant falls recently.  He was initially evaluated in [facility] where he was found to have brain mass.  Patient was subsequently transferred to [facility] for neurosurgical input.  Home Medications: Prior to Admission medications   Medication Sig Start Date End Date Taking? Authorizing Provider  polyethylene glycol (MIRALAX  / GLYCOLAX ) 17 g packet Take 17 g by mouth daily. Mix one tablespoon with 8oz of your favorite juice or water every day until you are having soft formed stools. Then start taking once daily if you didn't have a stool the day before. Patient not taking: Reported on 02/19/2020 01/29/20   Lang Dover, MD    Allergies: No Known Allergies  Past Medical History: Past Medical History:  Diagnosis Date   GERD (gastroesophageal reflux disease)     Past Surgical History:  Procedure Laterality Date   LEG SURGERY Bilateral as a child   XI ROBOTIC ASSISTED INGUINAL HERNIA REPAIR WITH MESH Right 02/19/2020   Procedure: XI ROBOTIC ASSISTED Right INGUINAL HERNIA REPAIR WITH MESH;  Surgeon: Jordis Laneta FALCON, MD;  Location: [facility] ORS;  Service: General;  Laterality: Right;    Social History: Current smoker.  No alcohol use.  No illicit drug use.  Family History: Denies any health problems in the  family   Review of Systems - History obtained from the patient General ROS: positive for  - fatigue Psychological ROS: negative Ophthalmic ROS: negative ENT ROS: negative Allergy and Immunology ROS: negative Hematological and Lymphatic ROS: negative Endocrine ROS: negative Respiratory ROS: no cough, shortness of breath, or wheezing Cardiovascular ROS: no chest pain or dyspnea on exertion Gastrointestinal ROS: no abdominal pain, change in bowel habits, or black or bloody stools Genito-Urinary ROS: no dysuria, trouble voiding, or hematuria Musculoskeletal ROS: negative Neurological ROS: As in HPI Dermatological ROS: negative  Physical Examination  Vitals:   07/26/24 2009  BP: (!) 129/100  Pulse: 99  Temp: 97.9 F (36.6 C)  TempSrc: Oral  SpO2: 99%    BP (!) 129/100 (BP Location: Right Arm)   Pulse 99   Temp 97.9 F (36.6 C) (Oral)   SpO2 99%   General appearance: alert, cooperative, appears stated age, and no distress Head: Normocephalic, without obvious abnormality, atraumatic Eyes: conjunctivae/corneas clear. PERRL, EOM's intact.  Throat: lips, mucosa, and tongue normal; teeth and gums normal Neck: no adenopathy, no carotid bruit, no JVD, supple, symmetrical, trachea midline, and thyroid not enlarged, symmetric, no tenderness/mass/nodules Resp: clear to auscultation bilaterally Cardio: regular rate and rhythm, S1, S2 normal, no murmur, click, rub or gallop GI: soft, non-tender; bowel sounds normal; no masses,  no organomegaly Extremities: extremities normal, atraumatic, no cyanosis or edema Pulses: 2+ and symmetric Skin: Skin color, texture, turgor normal. No rashes or lesions Lymph nodes: Cervical, supraclavicular, and axillary nodes  normal. Neurologic: Equal weakness of bilateral lower extremities noted but he is able to raise the legs off the bed.  No other focal deficits noted.   Labs on Admission: I have personally reviewed following labs and imaging  studies  CBC: Recent Labs  Lab 07/25/24 1002 07/26/24 0445  WBC 5.7 5.9  NEUTROABS 3.7  --   HGB 15.5 14.9  HCT 46.7 43.9  MCV 91.7 91.5  PLT 308 299   Basic Metabolic Panel: Recent Labs  Lab 07/25/24 1002 07/26/24 0445  NA 138 137  K 3.8 4.1  CL 102 101  CO2 26 26  GLUCOSE 113* 100*  BUN 11 9  CREATININE 0.79 0.72  CALCIUM 9.4 8.9   GFR: Estimated Creatinine Clearance: 88.3 mL/min (by C-G formula based on SCr of 0.72 mg/dL). Liver Function Tests: Recent Labs  Lab 07/25/24 1002  AST 25  ALT 18  ALKPHOS 54  BILITOT 0.9  PROT 7.3  ALBUMIN 3.8     Radiological Exams on Admission: CT CHEST ABDOMEN PELVIS W CONTRAST Result Date: 07/25/2024 CLINICAL DATA:  Brain mass, assess for metastatic disease. EXAM: CT CHEST, ABDOMEN, AND PELVIS WITH CONTRAST TECHNIQUE: Multidetector CT imaging of the chest, abdomen and pelvis was performed following the standard protocol during bolus administration of intravenous contrast. RADIATION DOSE REDUCTION: This exam was performed according to the departmental dose-optimization program which includes automated exposure control, adjustment of the mA and/or kV according to patient size and/or use of iterative reconstruction technique. CONTRAST:  OMNIPAQUE  IOHEXOL  300 MG/ML  SOLN COMPARISON:  Abdominopelvic CT 01/29/2020 FINDINGS: CT CHEST FINDINGS Cardiovascular: Normal heart size. No pericardial effusion. Minimal aortic atherosclerosis without aneurysm. Mediastinum/Nodes: No mediastinal or hilar adenopathy. Small hiatal hernia. No esophageal wall thickening. No suspicious thyroid nodule. Lungs/Pleura: Minimal emphysema. No pulmonary nodule or mass. Mild dependent atelectasis. No pleural effusion or thickening. The trachea and central airways are clear. Musculoskeletal: No evidence of lytic or blastic osseous lesion. Upper thoracic scoliosis. No chest wall soft tissue abnormalities. CT ABDOMEN PELVIS FINDINGS Hepatobiliary: No focal liver  abnormality is seen. No gallstones, gallbladder wall thickening, or biliary dilatation. Pancreas: No evidence of pancreatic mass. No ductal dilatation or inflammation. Spleen: Normal in size without focal abnormality. Adrenals/Urinary Tract: No adrenal nodule. No renal mass or renal calculi. No hydronephrosis. No bladder wall thickening or evidence of bladder mass. Stomach/Bowel: Small hiatal hernia. There is no gastric wall thickening. No evidence of small bowel mass, obstruction or inflammation. Moderate volume of stool in the colon. No obvious colonic mass. Distal descending and sigmoid colonic diverticulosis without diverticulitis. Vascular/Lymphatic: Normal caliber abdominal aorta with mild atherosclerosis. Portal vein is patent. No suspicious lymphadenopathy. Reproductive: Prostate is unremarkable. Other: Right inguinal hernia repair. No ascites. No omental thickening. No subcutaneous lesion. Musculoskeletal: Peripherally sclerotic lucency within the left iliac bone is unchanged from 2021 and considered benign. No suspicious bone lesion. Multilevel degenerative change in the spine. 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 IMPRESSION: 1. No evidence of primary malignancy or metastatic disease in the chest, abdomen, or pelvis. 2. Small hiatal hernia. Colonic diverticulosis without diverticulitis. 3. 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 Aortic Atherosclerosis (ICD10-I70.0) and Emphysema (ICD10-J43.9). Electronically Signed   By: Andrea Gasman M.D.   On: 07/25/2024 23:08   MR Cervical Spine W and Wo Contrast Result Date: 07/25/2024 CLINICAL DATA:  Provided history: Ataxia, nontraumatic, cervical pathology suspected. EXAM: MRI CERVICAL SPINE WITHOUT AND WITH CONTRAST TECHNIQUE: Multiplanar and multiecho pulse sequences of  the cervical spine, to include the craniocervical junction and cervicothoracic junction, were obtained without and with intravenous contrast.  CONTRAST:  6mL GADAVIST  GADOBUTROL  1 MMOL/ML IV SOLN COMPARISON:  None. FINDINGS: Alignment: Levocurvature of the cervical spine. Nonspecific straightening of the expected cervical lordosis. 4 mm C3-C4 grade 1 anterolisthesis. Slight C4-C5 grade 1 anterolisthesis. 5 mm C7-T1 grade 1 anterolisthesis. Vertebrae: Multilevel degenerative endplate irregularity. Mild degenerative endplate edema at R5-R4, C5-C6 and C6-C7. Small hemangioma within the C3 vertebral body. Facet ankylosis on the left at C2-C3 and on the left at C7-T1. Multifocal marrow edema and enhancement within the posterior elements, likely degenerative and related to facet arthropathy Cord: No signal abnormality identified within the cervical spinal cord. No pathologic spinal cord enhancement. Multilevel spinal cord flattening as described below. Posterior Fossa, vertebral arteries, paraspinal tissues: Posterior fossa assessed on same-day brain MRI. Flow voids preserved within visible portions of the cervical vertebral arteries. No paraspinal mass or collection. Nonspecific ill-defined edema and enhancement within the paraspinal soft tissues on the right at the C4-C7 levels. Disc levels: Multilevel disc degeneration, greatest at C4-C5, C5-C6 and C6-C7 (moderate-to-advanced at these levels). Also of note, disc degeneration is moderate at C7-T1. Developmentally narrow cervical spinal canal due to short pedicles. C2-C3: Facet ankylosis on the left. No significant disc herniation or stenosis. C3-C4: Grade 1 anterolisthesis. Uncovertebral hypertrophy on the right. Advanced facet arthropathy on the right. Mild ligamentum flavum thickening. Mild spinal canal stenosis. Severe right neural foraminal narrowing. C4-C5: Grade 1 anterolisthesis. Posterior disc osteophyte complex with bilateral disc osteophyte ridge/uncinate hypertrophy. Moderate facet arthropathy (greater on the left). Ligamentum flavum thickening. Severe spinal canal stenosis with spinal cord  flattening. Severe bilateral neural foraminal narrowing. C5-C6: Posterior disc osteophyte complex with bilateral disc osteophyte ridge/uncinate hypertrophy. Facet arthropathy (greater on the right and moderate-to-advanced on the right). Mild ligamentum flavum thickening. Mild spinal canal stenosis. Moderate-to-severe bilateral neural foraminal narrowing (greater on the left). C6-C7: Posterior disc osteophyte complex with bilateral disc osteophyte ridge/uncinate hypertrophy. Mild facet arthropathy. No significant spinal canal stenosis. Bilateral neural foraminal narrowing (mild-to-moderate right, moderate-to-severe left). C7-T1: Grade 1 anterolisthesis. Shallow disc bulge. Moderate facet arthropathy on the right. Facet ankylosis on the left. Mild ligamentum flavum thickening. Mild spinal canal stenosis. The disc bulge slightly flattens the ventral aspect of the spinal cord. Severe bilateral neural foraminal narrowing. IMPRESSION: 1. Cervical spondylosis as outlined within the body of the report. 2. At C4-C5, there is multifactorial severe spinal canal stenosis (with spinal cord flattening). Severe bilateral neural foraminal narrowing. 3. No more than mild spinal canal stenosis at the remaining levels. 4. Additional sites of foraminal stenosis, greatest on the right at C3-C4 (severe), bilaterally at C5-C6 (moderate-to-severe), on the left at C6-C7 (moderate-to-severe) and bilaterally at C7-T1 (severe). 5. Disc degeneration is greatest at C4-C5, C5-C6 and C6-C7 (moderate-to-advanced in severity with mild degenerative endplate edema at these levels). 6. Grade 1 anterolisthesis at C3-C4, C4-C5 and C7-T1. 7. Levocurvature of the cervical spine. 8. Multifocal marrow edema and enhancement within the posterior elements, likely degenerative and related to facet arthropathy. 9. Facet ankylosis on the left at C2-C3 and on the left at C7-T1. 10. Nonspecific edema and enhancement within the right paraspinal soft tissues at the  C4-C7 levels. Electronically Signed   By: Rockey Childs D.O.   On: 07/25/2024 19:32   MR THORACIC SPINE W WO CONTRAST Result Date: 07/25/2024 EXAM: MRI THORACIC SPINE WITH AND WITHOUT INTRAVENOUS CONTRAST 07/25/2024 05:42:39 PM TECHNIQUE: Multiplanar multisequence MRI of the thoracic spine was  performed with and without the administration of intravenous contrast. COMPARISON: None available. CLINICAL HISTORY: Bone lesion, thoracic spine, incidental. Patient reports increasing weakness in his legs, multiple falls, and stiffness in his legs. History of polio with prior multiple surgeries. Prescribed gabapentin  with no significant help. FINDINGS: BONES AND ALIGNMENT: Mild thoracic levoscoliosis. Normal vertebral body heights. Bone marrow signal is unremarkable. No abnormal enhancement. SPINAL CORD: Normal spinal cord volume. Normal spinal cord signal. SOFT TISSUES: Unremarkable. DEGENERATIVE CHANGES: Minimal degenerative disc disease. No spinal canal stenosis or neural foraminal narrowing. IMPRESSION: 1. No spinal canal stenosis or neural foraminal narrowing in the thoracic spine. 2. Mild thoracic levoscoliosis. Electronically signed by: Franky Stanford MD 07/25/2024 07:13 PM EDT RP Workstation: [id number]   MR Brain W and Wo Contrast Result Date: 07/25/2024 EXAM: MRI BRAIN WITH AND WITHOUT CONTRAST 07/25/2024 05:42:39 PM TECHNIQUE: Multiplanar multisequence MRI of the head/brain was performed with and without the administration of intravenous contrast. COMPARISON: CT head 06/06/2024 CLINICAL HISTORY: Headache, increasing frequency or severity. Patient reports increasing weakness in his legs and multiple falls. History of polio with prior multiple surgeries. FINDINGS: BRAIN AND VENTRICLES: There is a peripheral enhancing mass centered in the right frontoparietal white matter involving the centrum semiovale and extending into the periventricular white matter and corona radiata. The mass measures 4.5 x 4.5 x 2.9 cm. The  mass abuts the ependymal surface of the right lateral ventricle with associated mass effect on the ventricle. Mild diffusion restriction. There is associated susceptibility along the periphery of the mass. Surrounding T2/FLAIR hyperintensity suggestive of peritumoral edema which involves the posterior aspect of the right centrum semiovale and the right periventricular white matter. Additional T2/FLAIR hyperintensity in the periventricular and subcortical white matter suggestive of chronic microvascular ischemic changes. There is mild cerebral volume loss. No significant midline shift. No evidence of acute infarct. ORBITS: No acute abnormality. SINUSES: Mild mucosal thickening in the left maxillary sinus. Chronic deformity of the right lamina papyracea. BONES AND SOFT TISSUES: Normal bone marrow signal and enhancement. No acute soft tissue abnormality. IMPRESSION: 1. Peripheral enhancing mass in the right frontoparietal lobes measuring 4.5 x 4.5 x 2.9 cm, with mild associated peritumoral edema. The mass abuts the ependymal surface of the right lateral ventricle, causing mass effect without significant midline shift. 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 2. Additional T2/FLAIR hyperintensity in the periventricular and subcortical white matter, suggestive of chronic microvascular ischemic changes. 3. No acute infarct. Electronically signed by: Donnice Mania MD 07/25/2024 06:59 PM EDT RP Workstation: [id number]   CT HEAD WO CONTRAST ( ) Result Date: 07/25/2024 CLINICAL DATA:  Provided history: Headache, new onset. EXAM: CT HEAD WITHOUT CONTRAST TECHNIQUE: Contiguous axial images were obtained from the base of the skull through the vertex without intravenous contrast. RADIATION DOSE REDUCTION: This exam was performed according to the departmental dose-optimization program which includes automated exposure control, adjustment of the mA and/or kV according to patient size and/or use  of iterative reconstruction technique. COMPARISON:  Head CT 05/07/2024. FINDINGS: Brain: Since the head CT of 05/07/2024, interval increase in size of a lesion centered within the right frontoparietal white matter, now measuring 4.9 x 3.7 cm (for instance as seen on series 5, image 23) (series 2, image 20). The lesion also appears to extend to involve the callosal body (for instance as seen on series 4, image 36). This is most suspicious for a mass. Centrally, the lesion is hypodense. There is ill-defined hyperdensity along the which could reflect hypercellularity, mineralization and/or associated  hemorrhage. Progressive mass effect with partial effacement of the right lateral ventricle. No midline shift. No demarcated cortical infarct. No extra-axial fluid collection. Vascular: No hyperdense vessel.  Atherosclerotic calcifications. Skull: No calvarial fracture or aggressive osseous lesion. Visible sinuses/orbits: No mass or acute finding within the imaged orbits. Chronic medially displaced fracture deformity of the right lamina papyracea. Mild mucosal thickening within the right frontal sinus inferiorly. IMPRESSION: 4.9 x 3.7 cm lesion centered within the right frontoparietal white matter, as described and increased in size since the head CT of 05/07/2024. This is most suspicious for a mass (such as a high-grade primary CNS neoplasm, metastatic lesion or lymphoma). However, a brain MRI (with and without contrast) is recommended for further characterization. Progressive mass effect with partial effacement of the right lateral ventricle. No midline shift. Ill-defined hyperdensity at the periphery of the lesion which could reflect hypercellularity, mineralization and/or hemorrhage. Electronically Signed   By: Rockey Childs D.O.   On: 07/25/2024 16:20    My interpretation of Electrocardiogram: EKG has been ordered for the morning   Problem List  Principal Problem:   Brain mass Active Problems:   Nicotine   use   Assessment: This is 63 year old male who comes in with leg weakness and is found to have a brain mass.  Plan:  #1 Brain mass: This is located in the right frontoparietal area.  Patient underwent MRI brain which confirmed it.  Patient was initially on dexamethasone  which was discontinued by neurosurgery at [facility].  Will hold off for now.  Not noted to be on any seizure prophylaxis either.  Will defer this to neurosurgery.  Patient underwent CT scan of the chest abdomen pelvis which did not show any primary malignancy.  Neurosurgery to discuss with patient and family tomorrow regarding further steps.  He will eventually need to be seen by PT and OT.  2.  Tobacco abuse: Counseled.  3.  Incidental findings of colonic diverticulosis and avascular necrosis of the left femoral head was seen.   DVT Prophylaxis: SCDs for now Code Status: Full code Family Communication: Discussed with patient and his fiance Disposition: To be determined Consults called: Neurosurgery Admission Status: Status is: Inpatient Remains inpatient appropriate because: Brain mass requiring further evaluation and possible biopsy    Severity of Illness: The appropriate patient status for this patient is INPATIENT. Inpatient status is judged to be reasonable and necessary in order to provide the required intensity of service to ensure the patient's safety. The patient's presenting symptoms, physical exam findings, and initial radiographic and laboratory data in the context of their chronic comorbidities is felt to place them at high risk for further clinical deterioration. Furthermore, it is not anticipated that the patient will be medically stable for discharge from the hospital within 2 midnights of admission.   * I certify that at the point of admission it is my clinical judgment that the patient will require inpatient hospital care spanning beyond 2 midnights from the point of admission due to high intensity of  service, high risk for further deterioration and high frequency of surveillance required.*   Further management decisions will depend on results of further testing and patient's response to treatment.   Yitzhak Awan  [facility] Pager on [email or web address]  07/26/2024, 10:39 PM

## 2024-07-27 DIAGNOSIS — G939 Disorder of brain, unspecified: Secondary | ICD-10-CM

## 2024-07-27 DIAGNOSIS — G9389 Other specified disorders of brain: Secondary | ICD-10-CM | POA: Diagnosis not present

## 2024-07-27 LAB — CBC
HCT: 45.5 % (ref 39.0–52.0)
Hemoglobin: 15.2 g/dL (ref 13.0–17.0)
MCH: 30.5 pg (ref 26.0–34.0)
MCHC: 33.4 g/dL (ref 30.0–36.0)
MCV: 91.4 fL (ref 80.0–100.0)
Platelets: 310 K/uL (ref 150–400)
RBC: 4.98 MIL/uL (ref 4.22–5.81)
RDW: 13.1 % (ref 11.5–15.5)
WBC: 6.9 K/uL (ref 4.0–10.5)
nRBC: 0 % (ref 0.0–0.2)

## 2024-07-27 LAB — COMPREHENSIVE METABOLIC PANEL WITH GFR
ALT: 15 U/L (ref 0–44)
AST: 17 U/L (ref 15–41)
Albumin: 3.2 g/dL — ABNORMAL LOW (ref 3.5–5.0)
Alkaline Phosphatase: 54 U/L (ref 38–126)
Anion gap: 12 (ref 5–15)
BUN: 10 mg/dL (ref 8–23)
CO2: 25 mmol/L (ref 22–32)
Calcium: 9.3 mg/dL (ref 8.9–10.3)
Chloride: 104 mmol/L (ref 98–111)
Creatinine, Ser: 0.91 mg/dL (ref 0.61–1.24)
GFR, Estimated: >60 mL/min (ref >60)
Glucose, Bld: 101 mg/dL — ABNORMAL HIGH (ref 70–99)
Potassium: 4.5 mmol/L (ref 3.5–5.1)
Sodium: 141 mmol/L (ref 135–145)
Total Bilirubin: 0.7 mg/dL (ref 0.0–1.2)
Total Protein: 6.6 g/dL (ref 6.5–8.1)

## 2024-07-27 NOTE — Plan of Care (Signed)

## 2024-07-27 NOTE — Plan of Care (Signed)

## 2024-07-27 NOTE — Evaluation (Signed)
 Physical Therapy Evaluation Patient Details Name: Joshua Harmon MRN: [id number] DOB: December 26, 1960 Today's Date: 07/27/2024  History of Present Illness  63 y.o. male on 9/3, presented to ED at [facility] with complaint of progressive left-sided weakness over the past several weeks to months, leading to falls. Also reported intermittent confusion. CT head showed a 4.9 x 3.7 cm lesion centered within the right frontoparietal white matter suspicious for high-grade primary CNS neoplasm/metastatic. PMH significant for polio as a child, h/o inguinal hernia s/p robotic assisted repair.   Clinical Impression  Pt admitted with above diagnosis. Previously independent, driving a fork lift for work. Has noticed a progressive decline in his function for the past 3 months. Pt does demonstrate some cognitive difficulties and was bit disoriented but pleasant and appreciative of therapy visit. Educated extensively on safety, transfer and gait training. Requires up to mod assist to safely ambulate today. Significant issues with LLE increasing risk of falls with subjective report of multiple falls PTA. Poor coordination of LLE, distal weakness of LLE worse than proximal, and proximal weakness of LUE worse than distal strength. Asking CIR to follow pending biopsy results next week and plan of care that will be established. Lives with his fiance, has a sister he is close with and two sons per pt report. Pt currently with functional limitations due to the deficits listed below (see PT Problem List). Pt will benefit from acute skilled PT to increase their independence and safety with mobility to allow discharge.           If plan is discharge home, recommend the following: A lot of help with walking and/or transfers;A little help with bathing/dressing/bathroom;Assistance with cooking/housework;Direct supervision/assist for medications management;Direct supervision/assist for financial management;Assist for transportation;Help with  stairs or ramp for entrance;Supervision due to cognitive status   Can travel by private vehicle        Equipment Recommendations Wheelchair (measurements PT);Wheelchair cushion (measurements PT) (Pending progress)  Recommendations for Other Services  Rehab consult    Functional Status Assessment Patient has had a recent decline in their functional status and demonstrates the ability to make significant improvements in function in a reasonable and predictable amount of time.     Precautions / Restrictions Precautions Precautions: Fall Recall of Precautions/Restrictions: Impaired Restrictions Weight Bearing Restrictions Per Provider Order: No      Mobility  Bed Mobility               General bed mobility comments: Sitting edge of bed.    Transfers Overall transfer level: Needs assistance Equipment used: Rolling walker (2 wheels) Transfers: Sit to/from Stand Sit to Stand: Min assist, From elevated surface           General transfer comment: Min assist for boost to stand, bracing back of legs against bed heavily. Difficulty standing fully upright. performed x2 from elevated bed surface. Fair to poor control with descent into chair with cues for hand placement.    Ambulation/Gait Ambulation/Gait assistance: Mod assist Gait Distance (Feet): 10 Feet (+10) Assistive device: Rolling walker (2 wheels) Gait Pattern/deviations: Step-to pattern, Decreased step length - left, Decreased stance time - left, Decreased stride length, Decreased dorsiflexion - left, Decreased weight shift to left, Knee flexed in stance - left, Steppage, Ataxic, Trunk flexed, Drifts right/left, Wide base of support Gait velocity: slow Gait velocity interpretation: <1.31 ft/sec, indicative of household ambulator   General Gait Details: Poor control of LLE but with max cues able to advance LLE into RW. Cues for upright posture  with bil UEs for improved foot clearance. No overt buckling but shows  notable weakness and incoordination of LLE. Mod assist for RW control, sequencing, and balance.  Stairs            Wheelchair Mobility     Tilt Bed    Modified Rankin (Stroke Patients Only)       Balance Overall balance assessment: Needs assistance Sitting-balance support: No upper extremity supported, Feet supported Sitting balance-Leahy Scale: Fair Sitting balance - Comments: supervision EOB Postural control: Posterior lean Standing balance support: Bilateral upper extremity supported, Reliant on assistive device for balance Standing balance-Leahy Scale: Poor                               Pertinent Vitals/Pain Pain Assessment Pain Assessment: No/denies pain    Home Living Family/patient expects to be discharged to:: Private residence Living Arrangements: Spouse/significant other Available Help at Discharge: Family (Fiance) Type of Home: House Home Access: Stairs to enter Entrance Stairs-Rails: None Entrance Stairs-Number of Steps: 3   Home Layout: One level Home Equipment: Agricultural consultant (2 wheels)      Prior Function Prior Level of Function : Independent/Modified Independent;Working/employed;History of Falls (last six months);Driving             Mobility Comments: Reports progressive difficulty in mobility for past 3 months. Previously independent, now falling and using RW. ADLs Comments: Ind until he started to decline about 3 weeks ago. Working as a Museum/gallery exhibitions officer.     Extremity/Trunk Assessment   Upper Extremity Assessment Upper Extremity Assessment: Defer to OT evaluation (Weakness in LUE more pronounced proximally, unable to lift Lt shoulder fully against gravity.)    Lower Extremity Assessment Lower Extremity Assessment: LLE deficits/detail LLE Deficits / Details: Lt ankle DF 4-/5, Lt ankle eversion 3/5. knee extension 4+/5, knee flexion 4+/5, hip flexion 4/5,  hip abduction 4-/5, hip adduction 5/5. Abnormal HKS test on Lt. 3+  hyperreflexia Lt patellar tendon, absent ankle jerk. Reports normal sensation throughout LLE. LLE Coordination: decreased gross motor       Communication   Communication Communication: No apparent difficulties    Cognition Arousal: Alert Behavior During Therapy: WFL for tasks assessed/performed   PT - Cognitive impairments: No family/caregiver present to determine baseline, Orientation, Awareness, Sequencing, Problem solving   Orientation impairments: Situation, Place (Somewhat aware of situation. Initially thought he was in [place]. Unaware of name of [place] until reminded.)                     Following commands: Impaired Following commands impaired: Follows one step commands with increased time, Follows one step commands inconsistently     Cueing Cueing Techniques: Verbal cues, Tactile cues, Gestural cues     General Comments General comments (skin integrity, edema, etc.): Educated on safety, precautions, and encouraged OOB often with staff assist.    Exercises General Exercises - Lower Extremity Ankle Circles/Pumps: AROM, Both, 10 reps, Seated Long Arc Quad: Strengthening, 5 reps, Both, Seated   Assessment/Plan    PT Assessment Patient needs continued PT services  PT Problem List Decreased strength;Decreased range of motion;Decreased activity tolerance;Decreased balance;Decreased mobility;Decreased coordination;Decreased cognition;Decreased knowledge of use of DME;Decreased safety awareness;Decreased knowledge of precautions;Impaired tone       PT Treatment Interventions DME instruction;Gait training;Stair training;Functional mobility training;Therapeutic activities;Therapeutic exercise;Balance training;Neuromuscular re-education;Patient/family education;Cognitive remediation;Wheelchair mobility training    PT Goals (Current goals can be found in the Care Plan  section)  Acute Rehab PT Goals Patient Stated Goal: Get well PT Goal Formulation: With  patient Time For Goal Achievement: 08/10/24 Potential to Achieve Goals: Fair    Frequency Min 3X/week     Co-evaluation               AM-PAC PT 6 Clicks Mobility  Outcome Measure Help needed turning from your back to your side while in a flat bed without using bedrails?: A Little Help needed moving from lying on your back to sitting on the side of a flat bed without using bedrails?: A Little Help needed moving to and from a bed to a chair (including a wheelchair)?: A Lot Help needed standing up from a chair using your arms (e.g., wheelchair or bedside chair)?: A Little Help needed to walk in hospital room?: A Lot Help needed climbing 3-5 steps with a railing? : Total 6 Click Score: 14    End of Session Equipment Utilized During Treatment: Gait belt Activity Tolerance: Patient tolerated treatment well Patient left: in chair;with call bell/phone within reach;with chair alarm set;with family/visitor present Nurse Communication: Mobility status PT Visit Diagnosis: Unsteadiness on feet (R26.81);Other abnormalities of gait and mobility (R26.89);Repeated falls (R29.6);History of falling (Z91.81);Ataxic gait (R26.0);Difficulty in walking, not elsewhere classified (R26.2);Other symptoms and signs involving the nervous system (R29.898);Hemiplegia and hemiparesis Hemiplegia - Right/Left: Left Hemiplegia - dominant/non-dominant: Non-dominant Hemiplegia - caused by:  (brain mass)    Time: 8494-8464 PT Time Calculation (min) (ACUTE ONLY): 30 min   Charges:   PT Evaluation $PT Eval Moderate Complexity: 1 Mod PT Treatments $Gait Training: 8-22 mins PT General Charges $$ ACUTE PT VISIT: 1 Visit         Leontine Roads, PT, DPT [facility]  Rehabilitation Services Physical Therapist Office: [phone] Website: [email or web address]   Leontine GORMAN Roads 07/27/2024, 4:39 PM

## 2024-07-27 NOTE — Progress Notes (Signed)
 PROGRESS NOTE  Joshua Harmon  DOB: 1961-01-31  PCP: Patient, No Pcp Per FMW:[id number]  DOA: 07/26/2024  LOS: 1 day  Hospital Day: 2  Brief narrative: Joshua Harmon is a 63 y.o. 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 9/3, patient presented to ED at [facility] with complaint of progressive left-sided weakness over the past several weeks to months, leading to falls.  Also reported intermittent confusion.  Fianc reported that patient's thinking is not quite the same as it used to be.  He had an outpatient appointment with neurology on 9/3 but with the concern of worsening symptoms, was brought to the ED .  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 MRI brain showed peripheral enhancing mass in right frontotemporal lobe 4.5 x 4.5 x 2.9 cm with mild associated peritumoral edema.  Causing mass effect without significant midline shift. MRI C-spine cervical spondylosis, multifactorial severe spinal canal stenosis with bilateral neural foraminal narrowing. MRI T-spine negative Seen by neurosurgery CT chest abdomen/pelvis with no evidence of primary malignancy/metastatic disease. Recommended biopsy of the intracranial lesion and hence transferred to [facility]  Subjective: Patient was seen and examined this morning. Middle-aged African-American male. Propped up in bed.  Not in distress.  Fianc at bedside Chart reviewed. Most recent labs from this morning with CBC, CMP unremarkable Apparently brain biopsy planned for today has been postponed for Monday 9/8  Assessment and plan: Right frontoparietal brain mass Imagings as above Seen by neurosurgery Needs a biopsy -postponed for Monday Per neurosurgery recommendation, no need of steroids since he does not have significant swelling. Seizure prophylaxis none -defer to  neurosurgery  Colonic diverticulosis Recommend regular bowel regimen  Avascular necrosis of left femur head Symptoms probably related to childhood polio  Smoking Counseled to quit Nicotine  patch offered   Mobility:  PT Orders:   PT Follow up Rec:    Goals of care   Code Status: Full Code     DVT prophylaxis: Ordered SCDs Start: 07/26/24 2237   Antimicrobials: None Fluid: None Consultants: Neurosurgery Family Communication: Fianc at bedside  Status: Inpatient Level of care:  Med-Surg   Patient is from: Home Needs to continue in-hospital care: Pending brain biopsy Anticipated d/c to: Home after biopsy      Diet:  Diet Order             Diet NPO time specified  Diet effective midnight           Diet regular Room service appropriate? Yes; Fluid consistency: Thin  Diet effective now                   Scheduled Meds:   PRN meds: acetaminophen  **OR** acetaminophen , ondansetron  **OR** ondansetron  (ZOFRAN ) IV   Infusions:    Antimicrobials: Anti-infectives (From admission, onward)    None       Objective: Vitals:   07/27/24 0011 07/27/24 0332  BP: 115/89 107/78  Pulse: 73 65  Resp: 18 18  Temp: 97.9 F (36.6 C) 98 F (36.7 C)  SpO2: 97% 97%    Intake/Output Summary (Last 24 hours) at 07/27/2024 1251 Last data filed at 07/27/2024 0900 Gross per 24 hour  Intake --  Output 580 ml  Net -580 ml   There were no vitals filed for this visit. Weight change:  There is no height or weight on file  to calculate BMI.   Physical Exam: General exam: Pleasant, African-American male Skin: No rashes, lesions or ulcers. HEENT: Atraumatic, normocephalic, no obvious bleeding Lungs: Clear to auscultation bilaterally,  CVS: S1, S2, no murmur,   GI/Abd: Soft, nontender, nondistended, bowel sound present,   CNS: Alert, awake, oriented x 3 Psychiatry: Mood appropriate Extremities: No pedal edema, no calf tenderness,   Data Review: I have  personally reviewed the laboratory data and studies available.  F/u labs  Unresulted Labs (From admission, onward)    None      Signed, Chapman Rota, MD [facility] 07/27/2024

## 2024-07-27 NOTE — Progress Notes (Signed)
   Inpatient Rehab Admissions Coordinator :  Per therapy recommendations, patient was screened for CIR candidacy by Heron Leavell RN MSN. Noted plans for BX Monday. We will follow up after Bx and medical plan to assist with dispo planning. Please call me with any questions.  Heron Leavell RN MSN Admissions Coordinator [phone]

## 2024-07-27 NOTE — Consult Note (Signed)
 Reason for Consult:Brain Tumor Referring Physician: Reeves Daisy, MD  Assessment/Plan: I talked to the patient and his wife this morning.  I explained to them that he has a mass in the right side and that it could be an infection versus benign tumor versus a malignant tumor.  Given the location, I do not recommend a resection at this point but rather, recommend a biopsy.  I explained to them the benefits of the biopsy and explained to them the risk of infection but also all bleeding which is about 10%.  In 1% of the cases this can be a catastrophic bleeding that can result in paralysis and death.  I explained to them that this is highly unlikely however I need to disclose this to them.  Depending on the results of the biopsy we are going to make a plan on what has to happen He is tentatively scheduled for surgery on September 8.  Depending on the results we will take further actions.  He and his wife voiced understanding and I will see him back on the eighth.  If prior to that there are any questions, I am happy to reengage with them over the weekend.  Dino Sable, MD 07/27/2024, 6:22 PM   Joshua Harmon is an 63 y.o. male.  HPI:  63 y.o. 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  This has been ongoing for several weeks.  Worse over the last 1 week.  Denies any headache nausea vomiting.  No weight loss.  No shortness of breath chest pain.  He has been holding onto the furniture in his house to get around.  Has not had any significant falls recently.  He was initially evaluated in [facility] where he was found to have brain mass.  Patient was subsequently transferred to [facility] for neurosurgical input.   Past Medical History:  Diagnosis Date   GERD (gastroesophageal reflux disease)     Past Surgical History:  Procedure  Laterality Date   LEG SURGERY Bilateral as a child   XI ROBOTIC ASSISTED INGUINAL HERNIA REPAIR WITH MESH Right 02/19/2020   Procedure: XI ROBOTIC ASSISTED Right INGUINAL HERNIA REPAIR WITH MESH;  Surgeon: Jordis Laneta FALCON, MD;  Location: [facility] ORS;  Service: General;  Laterality: Right;    Family History  Family history unknown: Yes    Social History:  reports that he has been smoking cigarettes. He has never used smokeless tobacco. He reports that he does not currently use alcohol after a past usage of about 10.0 standard drinks of alcohol per week. He reports that he does not currently use drugs.  Allergies: No Known Allergies  Medications: I have reviewed the patient's current medications.  Results for orders placed or performed during the hospital encounter of 07/26/24 (from the past 48 hours)  Comprehensive metabolic panel     Status: Abnormal   Collection Time: 07/27/24  3:43 AM  Result Value Ref Range   Sodium 141 135 - 145 mmol/L   Potassium 4.5 3.5 - 5.1 mmol/L   Chloride 104 98 - 111 mmol/L   CO2 25 22 - 32 mmol/L   Glucose, Bld 101 (H) 70 - 99 mg/dL    Comment: Glucose reference range applies only to samples taken after fasting for at least 8 hours.   BUN 10 8 - 23 mg/dL  Creatinine, Ser 0.91 0.61 - 1.24 mg/dL   Calcium 9.3 8.9 - 89.6 mg/dL   Total Protein 6.6 6.5 - 8.1 g/dL   Albumin 3.2 (L) 3.5 - 5.0 g/dL   AST 17 15 - 41 U/L   ALT 15 0 - 44 U/L   Alkaline Phosphatase 54 38 - 126 U/L   Total Bilirubin 0.7 0.0 - 1.2 mg/dL   GFR, Estimated >39 >39 mL/min    Comment: (NOTE) Calculated using the CKD-EPI Creatinine Equation (2021)    Anion gap 12 5 - 15    Comment: Performed at [facility], [address] [place], [place] [address]  CBC     Status: None   Collection Time: 07/27/24  3:43 AM  Result Value Ref Range   WBC 6.9 4.0 - 10.5 K/uL   RBC 4.98 4.22 - 5.81 MIL/uL   Hemoglobin 15.2 13.0 - 17.0 g/dL   HCT 54.4 60.9 - 47.9 %   MCV 91.4 80.0 - 100.0 fL    MCH 30.5 26.0 - 34.0 pg   MCHC 33.4 30.0 - 36.0 g/dL   RDW 86.8 88.4 - 84.4 %   Platelets 310 150 - 400 K/uL   nRBC 0.0 0.0 - 0.2 %    Comment: Performed at [facility], [address], [place], [place] [address]    CT CHEST ABDOMEN PELVIS W CONTRAST Result Date: 07/25/2024 CLINICAL DATA:  Brain mass, assess for metastatic disease. EXAM: CT CHEST, ABDOMEN, AND PELVIS WITH CONTRAST TECHNIQUE: Multidetector CT imaging of the chest, abdomen and pelvis was performed following the standard protocol during bolus administration of intravenous contrast. RADIATION DOSE REDUCTION: This exam was performed according to the departmental dose-optimization program which includes automated exposure control, adjustment of the mA and/or kV according to patient size and/or use of iterative reconstruction technique. CONTRAST:  OMNIPAQUE  IOHEXOL  300 MG/ML  SOLN COMPARISON:  Abdominopelvic CT 01/29/2020 FINDINGS: CT CHEST FINDINGS Cardiovascular: Normal heart size. No pericardial effusion. Minimal aortic atherosclerosis without aneurysm. Mediastinum/Nodes: No mediastinal or hilar adenopathy. Small hiatal hernia. No esophageal wall thickening. No suspicious thyroid nodule. Lungs/Pleura: Minimal emphysema. No pulmonary nodule or mass. Mild dependent atelectasis. No pleural effusion or thickening. The trachea and central airways are clear. Musculoskeletal: No evidence of lytic or blastic osseous lesion. Upper thoracic scoliosis. No chest wall soft tissue abnormalities. CT ABDOMEN PELVIS FINDINGS Hepatobiliary: No focal liver abnormality is seen. No gallstones, gallbladder wall thickening, or biliary dilatation. Pancreas: No evidence of pancreatic mass. No ductal dilatation or inflammation. Spleen: Normal in size without focal abnormality. Adrenals/Urinary Tract: No adrenal nodule. No renal mass or renal calculi. No hydronephrosis. No bladder wall thickening or evidence of bladder mass. Stomach/Bowel: Small hiatal  hernia. There is no gastric wall thickening. No evidence of small bowel mass, obstruction or inflammation. Moderate volume of stool in the colon. No obvious colonic mass. Distal descending and sigmoid colonic diverticulosis without diverticulitis. Vascular/Lymphatic: Normal caliber abdominal aorta with mild atherosclerosis. Portal vein is patent. No suspicious lymphadenopathy. Reproductive: Prostate is unremarkable. Other: Right inguinal hernia repair. No ascites. No omental thickening. No subcutaneous lesion. Musculoskeletal: Peripherally sclerotic lucency within the left iliac bone is unchanged from 2021 and considered benign. No suspicious bone lesion. Multilevel degenerative change in the spine. 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 IMPRESSION: 1. No evidence of primary malignancy or metastatic disease in the chest, abdomen, or pelvis. 2. Small hiatal hernia. Colonic diverticulosis without diverticulitis. 3. Avascular necrosis of the left femoral  head without collapse. Aortic Atherosclerosis (ICD10-I70.0) and Emphysema (ICD10-J43.9). Electronically Signed   By: Andrea Gasman M.D.   On: 07/25/2024 23:08    Review of Systems  Neurological:  Positive for dizziness, weakness and headaches.  All other systems reviewed and are negative.  There were no vitals taken for this visit. Physical Exam HENT:     Head: Normocephalic.     Nose: Nose normal.  Eyes:     Pupils: Pupils are equal, round, and reactive to light.  Cardiovascular:     Rate and Rhythm: Normal rate.  Pulmonary:     Effort: Pulmonary effort is normal.  Abdominal:     General: Abdomen is flat.  Musculoskeletal:     Cervical back: Normal range of motion.  Neurological:     Mental Status: He is alert.     Cranial Nerves: Cranial nerves 2-12 are intact.     Sensory: Sensation is intact.     Motor: Weakness present.     Coordination: Coordination abnormal. Finger-Nose-Finger Test abnormal.     Comments: LUE  4+/5 LLE 4+/5 Gait not tested

## 2024-07-27 NOTE — TOC Initial Note (Signed)
 Transition of Care [facility]) - Initial/Assessment Note    Patient Details  Name: Joshua Harmon MRN: [id number] Date of Birth: 10/30/1961  Transition of Care [facility]) CM/SW Contact:    Andrez JULIANNA George, RN Phone Number: 07/27/2024, 11:39 AM  Clinical Narrative:                  Pt is from home with his fiance. They are together most of the time.  Fiance provides needed transportation. Pt was only taking one medication at home and says he takes it as ordered.  Pt has a new PCP appt scheduled at the [facility]. This will need to be rescheduled if admitted during this time.  Plans is for brain bx on Monday.  IP Care management following.  Expected Discharge Plan: Home/Self Care Barriers to Discharge: Continued Medical Work up   Patient Goals and CMS Choice            Expected Discharge Plan and Services       Living arrangements for the past 2 months: Single Family Home                                      Prior Living Arrangements/Services Living arrangements for the past 2 months: Single Family Home Lives with:: Significant Other Patient language and need for interpreter reviewed:: Yes Do you feel safe going back to the place where you live?: Yes        Care giver support system in place?: Yes (comment) Current home services: DME (cane) Criminal Activity/Legal Involvement Pertinent to Current Situation/Hospitalization: No - Comment as needed  Activities of Daily Living      Permission Sought/Granted                  Emotional Assessment Appearance:: Appears stated age Attitude/Demeanor/Rapport: Engaged Affect (typically observed): Accepting Orientation: : Oriented to Self, Oriented to Place, Oriented to  Time, Oriented to Situation   Psych Involvement: No (comment)  Admission diagnosis:  Brain mass [G93.89] Patient Active Problem List   Diagnosis Date Noted   Brain mass 07/25/2024   History of hernia repair 07/25/2024   Nicotine  use  07/25/2024   Leg weakness, bilateral 07/25/2024   PCP:  Patient, No Pcp Per Pharmacy:   [facility] [address]), [place] - [address] EUGENE OTHEL JACOBS [place]) [place] [address] Phone: [phone] Fax: [phone]     Social Drivers of Health (SDOH) Social History: SDOH Screenings   Food Insecurity: No Food Insecurity (07/27/2024)  Recent Concern: Food Insecurity - Food Insecurity Present (05/07/2024)   Received from [facility]  Housing: Low Risk  (07/27/2024)  Recent Concern: Housing - High Risk (05/07/2024)   Received from [facility]  Transportation Needs: No Transportation Needs (07/27/2024)  Recent Concern: Transportation Needs - Unmet Transportation Needs (05/07/2024)   Received from [facility]  Utilities: Not At Risk (07/27/2024)  Recent Concern: Utilities - At Risk (05/07/2024)   Received from [facility]  Financial Resource Strain: Medium Risk (05/07/2024)   Received from [facility]  Social Connections: Unknown (07/25/2024)  Tobacco Use: High Risk (07/25/2024)   SDOH Interventions:     Readmission Risk Interventions     No data to display

## 2024-07-28 DIAGNOSIS — G9389 Other specified disorders of brain: Secondary | ICD-10-CM | POA: Diagnosis not present

## 2024-07-28 NOTE — Plan of Care (Signed)

## 2024-07-28 NOTE — Progress Notes (Signed)
 PROGRESS NOTE  Joshua Harmon  DOB: 11-27-1960  PCP: Patient, No Pcp Per FMW:[id number]  DOA: 07/26/2024  LOS: 2 days  Hospital Day: 3  Brief narrative: DASHTON CZERWINSKI is a 63 y.o. 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 9/3, patient presented to ED at [facility] with complaint of progressive left-sided weakness over the past several weeks to months, leading to falls.  Also reported intermittent confusion.  Fianc reported that patient's thinking is not quite the same as it used to be.  He had an outpatient appointment with neurology on 9/3 but with the concern of worsening symptoms, was brought to the ED .  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 MRI brain showed peripheral enhancing mass in right frontotemporal lobe 4.5 x 4.5 x 2.9 cm with mild associated peritumoral edema.  Causing mass effect without significant midline shift. MRI C-spine cervical spondylosis, multifactorial severe spinal canal stenosis with bilateral neural foraminal narrowing. MRI T-spine negative Seen by neurosurgery CT chest abdomen/pelvis with no evidence of primary malignancy/metastatic disease. Recommended biopsy of the intracranial lesion and hence transferred to [facility]  Subjective: Patient was seen and examined this morning. Propped up in bed.  Not in distress. Pending brain biopsy on Monday. PT recommended CIR  Assessment and plan: Right frontoparietal brain mass Imagings as above Seen by neurosurgery Needs a biopsy -postponed for Monday Per neurosurgery recommendation, no need of steroids since he does not have significant swelling. Seizure prophylaxis none -defer to neurosurgery  Colonic diverticulosis Recommend regular bowel regimen  Avascular necrosis of left femur head Symptoms probably related to childhood  polio  Smoking Counseled to quit Nicotine  patch offered   Mobility:  PT Orders:   PT Follow up Rec: Acute Inpatient Rehab (3hours/Day)07/27/2024 1600   Goals of care   Code Status: Full Code     DVT prophylaxis: Ordered SCDs Start: 07/26/24 2237   Antimicrobials: None Fluid: None Consultants: Neurosurgery Family Communication: Fianc at bedside  Status: Inpatient Level of care:  Med-Surg   Patient is from: Home Needs to continue in-hospital care: Pending brain biopsy Anticipated d/c to: PT recommended CIR      Diet:  Diet Order             Diet NPO time specified  Diet effective midnight           Diet regular Room service appropriate? Yes; Fluid consistency: Thin  Diet effective now                   Scheduled Meds:   PRN meds: acetaminophen  **OR** acetaminophen , ondansetron  **OR** ondansetron  (ZOFRAN ) IV   Infusions:    Antimicrobials: Anti-infectives (From admission, onward)    None       Objective: Vitals:   07/27/24 0332 07/28/24 0735  BP: 107/78 (!) 112/90  Pulse: 65 70  Resp: 18 18  Temp: 98 F (36.7 C) 99.2 F (37.3 C)  SpO2: 97% 96%    Intake/Output Summary (Last 24 hours) at 07/28/2024 1111 Last data filed at 07/28/2024 0900 Gross per 24 hour  Intake 360 ml  Output 500 ml  Net -140 ml   There were no vitals filed for this visit. Weight change:  There is no height or weight on file to calculate BMI.   Physical Exam: General exam: Pleasant, African-American male Skin: No rashes, lesions or  ulcers. HEENT: Atraumatic, normocephalic, no obvious bleeding Lungs: Clear to auscultation bilaterally,  CVS: S1, S2, no murmur,   GI/Abd: Soft, nontender, nondistended, bowel sound present,   CNS: Alert, awake, oriented x 3 Psychiatry: Mood appropriate Extremities: No pedal edema, no calf tenderness,   Data Review: I have personally reviewed the laboratory data and studies available.  F/u labs  Unresulted Labs (From  admission, onward)    None      Signed, Chapman Rota, MD [facility] 07/28/2024

## 2024-07-28 NOTE — Plan of Care (Signed)
  Problem: Health Behavior/Discharge Planning: Goal: Ability to manage health-related needs will improve Outcome: Progressing   Problem: Clinical Measurements: Goal: Will remain free from infection Outcome: Progressing   Problem: Clinical Measurements: Goal: Diagnostic test results will improve Outcome: Progressing   Problem: Activity: Goal: Risk for activity intolerance will decrease Outcome: Progressing   Problem: Nutrition: Goal: Adequate nutrition will be maintained Outcome: Progressing   Problem: Coping: Goal: Level of anxiety will decrease Outcome: Progressing   Problem: Elimination: Goal: Will not experience complications related to urinary retention Outcome: Progressing   Problem: Safety: Goal: Ability to remain free from injury will improve Outcome: Progressing   Problem: Skin Integrity: Goal: Risk for impaired skin integrity will decrease Outcome: Progressing

## 2024-07-29 DIAGNOSIS — G9389 Other specified disorders of brain: Secondary | ICD-10-CM | POA: Diagnosis not present

## 2024-07-29 LAB — GLUCOSE, CAPILLARY: Glucose-Capillary: 112 mg/dL — ABNORMAL HIGH (ref 70–99)

## 2024-07-29 MED ORDER — MUPIROCIN 2 % EX OINT
1.0000 | TOPICAL_OINTMENT | Freq: Two times a day (BID) | CUTANEOUS | Status: DC
  Administered 2024-07-30 – 2024-07-31 (×4): 1 via NASAL
  Filled 2024-07-29 (×3): qty 22

## 2024-07-29 NOTE — Plan of Care (Signed)

## 2024-07-29 NOTE — Plan of Care (Signed)
  Problem: Health Behavior/Discharge Planning: Goal: Ability to manage health-related needs will improve Outcome: Progressing   Problem: Clinical Measurements: Goal: Will remain free from infection Outcome: Progressing   Problem: Activity: Goal: Risk for activity intolerance will decrease Outcome: Progressing   Problem: Nutrition: Goal: Adequate nutrition will be maintained Outcome: Progressing   Problem: Coping: Goal: Level of anxiety will decrease Outcome: Progressing   Problem: Elimination: Goal: Will not experience complications related to bowel motility Outcome: Progressing   Problem: Safety: Goal: Ability to remain free from injury will improve Outcome: Progressing   Problem: Skin Integrity: Goal: Risk for impaired skin integrity will decrease Outcome: Progressing

## 2024-07-29 NOTE — Progress Notes (Signed)
 PROGRESS NOTE  Joshua Harmon  DOB: [birth year]  PCP: Patient, No Pcp Per FMW:[id number]  DOA: 07/26/2024  LOS: 3 days  Hospital Day: 4  Brief narrative: Joshua Harmon is a 63 y.o. 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 9/3, patient presented to ED at [facility] with complaint of progressive left-sided weakness over the past several weeks to months, leading to falls.  Also reported intermittent confusion.  Fianc reported that patient's thinking is not quite the same as it used to be.  He had an outpatient appointment with neurology on 9/3 but with the concern of worsening symptoms, was brought to the ED .  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 MRI brain showed peripheral enhancing mass in right frontotemporal lobe 4.5 x 4.5 x 2.9 cm with mild associated peritumoral edema.  Causing mass effect without significant midline shift. MRI C-spine cervical spondylosis, multifactorial severe spinal canal stenosis with bilateral neural foraminal narrowing. MRI T-spine negative Seen by neurosurgery CT chest abdomen/pelvis with no evidence of primary malignancy/metastatic disease. Recommended biopsy of the intracranial lesion and hence transferred to [facility]  Subjective: Patient was seen and examined this morning. Lying on bed.  Not in distress. No new symptoms. Pending brain biopsy tomorrow.  Assessment and plan: Right frontoparietal brain mass Imagings as above Seen by neurosurgery, pending brain biopsy tomorrow  N.p.o. after midnight.  Not on any heparin product Per neurosurgery recommendation, no need of steroids since he does not have significant swelling. Seizure prophylaxis none -defer to neurosurgery  Colonic diverticulosis Recommend regular bowel regimen  Avascular necrosis of left femur  head Symptoms probably related to childhood polio  Smoking Counseled to quit Nicotine  patch offered   Mobility:  PT Orders:   PT Follow up Rec: Acute Inpatient Rehab (3hours/Day)07/27/2024 1600   Goals of care   Code Status: Full Code     DVT prophylaxis: Ordered SCDs Start: 07/26/24 2237   Antimicrobials: None Fluid: None Consultants: Neurosurgery Family Communication: Fianc at bedside  Status: Inpatient Level of care:  Med-Surg   Patient is from: Home Needs to continue in-hospital care: Pending brain biopsy tomorrow Anticipated d/c to: PT recommended CIR      Diet:  Diet Order             Diet NPO time specified  Diet effective midnight           Diet regular Room service appropriate? Yes with Assist; Fluid consistency: Thin  Diet effective now                   Scheduled Meds:   PRN meds: acetaminophen  **OR** acetaminophen , ondansetron  **OR** ondansetron  (ZOFRAN ) IV   Infusions:    Antimicrobials: Anti-infectives (From admission, onward)    None       Objective: Vitals:   07/28/24 0735 07/29/24 0742  BP: (!) 112/90 98/76  Pulse: 70 77  Resp: 18 20  Temp: 99.2 F (37.3 C) 97.9 F (36.6 C)  SpO2: 96% 97%    Intake/Output Summary (Last 24 hours) at 07/29/2024 1018 Last data filed at 07/29/2024 9286 Gross per 24 hour  Intake 480 ml  Output 1250 ml  Net -770 ml   There were no vitals filed for this visit. Weight change:  There is no height or weight on file to calculate BMI.   Physical Exam: General  exam: Pleasant, African-American male Skin: No rashes, lesions or ulcers. HEENT: Atraumatic, normocephalic, no obvious bleeding Lungs: Clear to auscultation bilaterally,  CVS: S1, S2, no murmur,   GI/Abd: Soft, nontender, nondistended, bowel sound present,   CNS: Alert, awake, oriented x 3 Psychiatry: Mood appropriate Extremities: No pedal edema, no calf tenderness,   Data Review: I have personally reviewed the laboratory  data and studies available.  F/u labs  Unresulted Labs (From admission, onward)    None      Signed, Chapman Rota, MD [facility] 07/29/2024

## 2024-07-30 ENCOUNTER — Inpatient Hospital Stay (HOSPITAL_COMMUNITY): Admission: RE | Admit: 2024-07-30 | Source: Home / Self Care | Admitting: Neurosurgery

## 2024-07-30 ENCOUNTER — Encounter (HOSPITAL_COMMUNITY)
Admission: EM | Disposition: A | Discharge status: Rehabilitation Facility | Payer: Self-pay | Source: Other Acute Inpatient Hospital | Attending: Internal Medicine

## 2024-07-30 ENCOUNTER — Other Ambulatory Visit: Payer: Self-pay

## 2024-07-30 ENCOUNTER — Inpatient Hospital Stay (HOSPITAL_COMMUNITY): Admitting: Anesthesiology

## 2024-07-30 ENCOUNTER — Encounter (HOSPITAL_COMMUNITY): Payer: Self-pay | Admitting: Internal Medicine

## 2024-07-30 DIAGNOSIS — G9389 Other specified disorders of brain: Secondary | ICD-10-CM | POA: Diagnosis not present

## 2024-07-30 DIAGNOSIS — D496 Neoplasm of unspecified behavior of brain: Secondary | ICD-10-CM | POA: Diagnosis present

## 2024-07-30 DIAGNOSIS — C719 Malignant neoplasm of brain, unspecified: Secondary | ICD-10-CM | POA: Diagnosis present

## 2024-07-30 DIAGNOSIS — C711 Malignant neoplasm of frontal lobe: Secondary | ICD-10-CM

## 2024-07-30 HISTORY — PX: APPLICATION OF CRANIAL NAVIGATION: SHX6578

## 2024-07-30 HISTORY — PX: STERIOTACTIC STIMULATOR INSERTION: SHX5374

## 2024-07-30 LAB — SURGICAL PCR SCREEN
MRSA, PCR: NEGATIVE
Staphylococcus aureus: NEGATIVE

## 2024-07-30 LAB — GLUCOSE, CAPILLARY: Glucose-Capillary: 174 mg/dL — ABNORMAL HIGH (ref 70–99)

## 2024-07-30 LAB — MRSA NEXT GEN BY PCR, NASAL: MRSA by PCR Next Gen: NOT DETECTED

## 2024-07-30 LAB — HEMOGLOBIN A1C
Hgb A1c MFr Bld: 5.8 % — ABNORMAL HIGH (ref 4.8–5.6)
Mean Plasma Glucose: 119.76 mg/dL

## 2024-07-30 SURGERY — STERIOTACTIC BIOPSY
Anesthesia: General | Site: Head | Laterality: Right

## 2024-07-30 MED ORDER — DOCUSATE SODIUM 100 MG PO CAPS: 100.0000 mg | ORAL_CAPSULE | Freq: Two times a day (BID) | ORAL | Status: DC | PRN

## 2024-07-30 MED ORDER — POLYETHYLENE GLYCOL 3350 17 G PO PACK
17.0000 g | PACK | Freq: Every day | ORAL | Status: DC | PRN
  Administered 2024-08-01: 17 g via ORAL
  Filled 2024-07-30: qty 1

## 2024-07-30 MED ORDER — OXYCODONE HCL 5 MG PO TABS: 5.0000 mg | ORAL_TABLET | Freq: Once | ORAL | Status: DC | PRN

## 2024-07-30 MED ORDER — SODIUM CHLORIDE 0.9 % IV SOLN: INTRAVENOUS | Status: DC

## 2024-07-30 MED ORDER — CHLORHEXIDINE GLUCONATE CLOTH 2 % EX PADS
6.0000 | MEDICATED_PAD | Freq: Every day | CUTANEOUS | Status: DC
Start: 2024-07-30 — End: 2024-08-02
  Administered 2024-07-30 – 2024-08-01 (×3): 6 via TOPICAL

## 2024-07-30 MED ORDER — ACETAMINOPHEN 10 MG/ML IV SOLN: 1000.0000 mg | Freq: Once | INTRAVENOUS | Status: DC | PRN

## 2024-07-30 MED ORDER — LABETALOL HCL 5 MG/ML IV SOLN: 10.0000 mg | INTRAVENOUS | Status: DC | PRN

## 2024-07-30 MED ORDER — DEXAMETHASONE SODIUM PHOSPHATE 4 MG/ML IJ SOLN: 4.0000 mg | Freq: Four times a day (QID) | INTRAMUSCULAR | Status: DC

## 2024-07-30 MED ORDER — FENTANYL CITRATE (PF) 250 MCG/5ML IJ SOLN
INTRAMUSCULAR | Status: DC | PRN
  Administered 2024-07-30: 100 ug via INTRAVENOUS

## 2024-07-30 MED ORDER — FENTANYL CITRATE (PF) 250 MCG/5ML IJ SOLN
INTRAMUSCULAR | Status: AC
  Filled 2024-07-30: qty 5

## 2024-07-30 MED ORDER — ONDANSETRON HCL 4 MG/2ML IJ SOLN: 4.0000 mg | Freq: Once | INTRAMUSCULAR | Status: DC | PRN

## 2024-07-30 MED ORDER — CHLORHEXIDINE GLUCONATE 0.12 % MT SOLN
OROMUCOSAL | Status: AC
  Administered 2024-07-30: 15 mL via OROMUCOSAL
  Filled 2024-07-30: qty 15

## 2024-07-30 MED ORDER — LIDOCAINE 2% (20 MG/ML) 5 ML SYRINGE
INTRAMUSCULAR | Status: DC | PRN
  Administered 2024-07-30: 100 mg via INTRAVENOUS

## 2024-07-30 MED ORDER — DEXAMETHASONE SODIUM PHOSPHATE 10 MG/ML IJ SOLN
6.0000 mg | Freq: Four times a day (QID) | INTRAMUSCULAR | Status: AC
  Administered 2024-07-30 – 2024-07-31 (×4): 6 mg via INTRAVENOUS
  Filled 2024-07-30 (×4): qty 1

## 2024-07-30 MED ORDER — PHENYLEPHRINE HCL-NACL 20-0.9 MG/250ML-% IV SOLN
INTRAVENOUS | Status: DC | PRN
  Administered 2024-07-30: 20 ug/min via INTRAVENOUS

## 2024-07-30 MED ORDER — 0.9 % SODIUM CHLORIDE (POUR BTL) OPTIME
TOPICAL | Status: DC | PRN
  Administered 2024-07-30: 1000 mL

## 2024-07-30 MED ORDER — SUGAMMADEX SODIUM 200 MG/2ML IV SOLN
INTRAVENOUS | Status: DC | PRN
  Administered 2024-07-30: 200 mg via INTRAVENOUS

## 2024-07-30 MED ORDER — OXYCODONE HCL 5 MG/5ML PO SOLN: 5.0000 mg | Freq: Once | ORAL | Status: DC | PRN

## 2024-07-30 MED ORDER — LIDOCAINE-EPINEPHRINE 1 %-1:100000 IJ SOLN
INTRAMUSCULAR | Status: AC
  Filled 2024-07-30: qty 1

## 2024-07-30 MED ORDER — BACITRACIN ZINC 500 UNIT/GM EX OINT
TOPICAL_OINTMENT | CUTANEOUS | Status: AC
  Filled 2024-07-30: qty 28.35

## 2024-07-30 MED ORDER — FENTANYL CITRATE PF 50 MCG/ML IJ SOSY
25.0000 ug | PREFILLED_SYRINGE | INTRAMUSCULAR | Status: AC | PRN
Start: 2024-07-30 — End: 2024-07-31

## 2024-07-30 MED ORDER — LEVETIRACETAM (KEPPRA) 500 MG/5 ML ADULT IV PUSH
500.0000 mg | Freq: Two times a day (BID) | INTRAVENOUS | Status: DC
Start: 2024-07-30 — End: 2024-07-31
  Administered 2024-07-30 – 2024-07-31 (×2): 500 mg via INTRAVENOUS
  Filled 2024-07-30 (×2): qty 5

## 2024-07-30 MED ORDER — ONDANSETRON HCL 4 MG/2ML IJ SOLN
INTRAMUSCULAR | Status: DC | PRN
  Administered 2024-07-30: 4 mg via INTRAVENOUS

## 2024-07-30 MED ORDER — ORAL CARE MOUTH RINSE: 15.0000 mL | Freq: Once | OROMUCOSAL | Status: AC

## 2024-07-30 MED ORDER — LIDOCAINE-EPINEPHRINE 1 %-1:100000 IJ SOLN
INTRAMUSCULAR | Status: DC | PRN
  Administered 2024-07-30: 5 mL

## 2024-07-30 MED ORDER — CHLORHEXIDINE GLUCONATE 0.12 % MT SOLN: 15.0000 mL | Freq: Once | OROMUCOSAL | Status: AC

## 2024-07-30 MED ORDER — DEXAMETHASONE SODIUM PHOSPHATE 4 MG/ML IJ SOLN
4.0000 mg | Freq: Three times a day (TID) | INTRAMUSCULAR | Status: DC
Start: 2024-08-01 — End: 2024-07-31

## 2024-07-30 MED ORDER — FENTANYL CITRATE (PF) 100 MCG/2ML IJ SOLN: 25.0000 ug | INTRAMUSCULAR | Status: DC | PRN

## 2024-07-30 MED ORDER — SODIUM CHLORIDE 0.9 % IV SOLN
0.0125 ug/kg/min | INTRAVENOUS | Status: AC
  Administered 2024-07-30: .2 ug/kg/min via INTRAVENOUS
  Filled 2024-07-30: qty 2000

## 2024-07-30 MED ORDER — CEFAZOLIN SODIUM-DEXTROSE 2-4 GM/100ML-% IV SOLN
2.0000 g | Freq: Three times a day (TID) | INTRAVENOUS | Status: AC
  Administered 2024-07-30 – 2024-07-31 (×3): 2 g via INTRAVENOUS
  Filled 2024-07-30 (×3): qty 100

## 2024-07-30 MED ORDER — PHENYLEPHRINE 80 MCG/ML (10ML) SYRINGE FOR IV PUSH (FOR BLOOD PRESSURE SUPPORT)
PREFILLED_SYRINGE | INTRAVENOUS | Status: DC | PRN
  Administered 2024-07-30: 80 ug via INTRAVENOUS
  Administered 2024-07-30 (×2): 160 ug via INTRAVENOUS
  Administered 2024-07-30: 80 ug via INTRAVENOUS

## 2024-07-30 MED ORDER — DEXAMETHASONE SODIUM PHOSPHATE 10 MG/ML IJ SOLN
INTRAMUSCULAR | Status: DC | PRN
  Administered 2024-07-30: 10 mg via INTRAVENOUS

## 2024-07-30 MED ORDER — PANTOPRAZOLE SODIUM 40 MG IV SOLR
40.0000 mg | Freq: Every day | INTRAVENOUS | Status: DC
  Administered 2024-07-30: 40 mg via INTRAVENOUS
  Filled 2024-07-30: qty 10

## 2024-07-30 MED ORDER — CEFAZOLIN SODIUM-DEXTROSE 2-4 GM/100ML-% IV SOLN
2.0000 g | Freq: Once | INTRAVENOUS | Status: AC
  Administered 2024-07-30: 2 g via INTRAVENOUS
  Filled 2024-07-30: qty 100

## 2024-07-30 MED ORDER — SODIUM CHLORIDE 0.9 % IV SOLN
INTRAVENOUS | Status: DC | PRN
  Administered 2024-07-30: 1000 mg via INTRAVENOUS

## 2024-07-30 MED ORDER — EPHEDRINE SULFATE-NACL 50-0.9 MG/10ML-% IV SOSY
PREFILLED_SYRINGE | INTRAVENOUS | Status: DC | PRN
  Administered 2024-07-30: 5 mg via INTRAVENOUS

## 2024-07-30 MED ORDER — BUTALBITAL-APAP-CAFFEINE 50-325-40 MG PO TABS: 2.0000 | ORAL_TABLET | ORAL | Status: DC | PRN

## 2024-07-30 MED ORDER — INSULIN ASPART 100 UNIT/ML IJ SOLN
0.0000 [IU] | INTRAMUSCULAR | Status: DC
  Administered 2024-07-30 – 2024-07-31 (×2): 2 [IU] via SUBCUTANEOUS
  Administered 2024-07-31: 1 [IU] via SUBCUTANEOUS
  Administered 2024-07-31 (×2): 2 [IU] via SUBCUTANEOUS
  Administered 2024-07-31: 5 [IU] via SUBCUTANEOUS
  Administered 2024-07-31: 2 [IU] via SUBCUTANEOUS
  Administered 2024-08-01 (×4): 1 [IU] via SUBCUTANEOUS
  Administered 2024-08-02 (×2): 2 [IU] via SUBCUTANEOUS
  Administered 2024-08-02: 1 [IU] via SUBCUTANEOUS
  Administered 2024-08-02: 2 [IU] via SUBCUTANEOUS
  Administered 2024-08-03 (×4): 1 [IU] via SUBCUTANEOUS

## 2024-07-30 MED ORDER — PROPOFOL 10 MG/ML IV BOLUS
INTRAVENOUS | Status: DC | PRN
  Administered 2024-07-30: 120 mg via INTRAVENOUS

## 2024-07-30 MED ORDER — ROCURONIUM BROMIDE 10 MG/ML (PF) SYRINGE
PREFILLED_SYRINGE | INTRAVENOUS | Status: DC | PRN
  Administered 2024-07-30: 60 mg via INTRAVENOUS

## 2024-07-30 SURGICAL SUPPLY — 57 items
BAG COUNTER SPONGE SURGICOUNT (BAG) ×3 IMPLANT
BUR ACORN 9.0 PRECISION (BURR) ×3 IMPLANT
CANISTER SUCTION 3000ML PPV (SUCTIONS) ×3 IMPLANT
CATH ROBINSON RED A/P 8FR (CATHETERS) ×3 IMPLANT
CHLORAPREP W/TINT 26 (MISCELLANEOUS) ×3 IMPLANT
CNTNR URN SCR LID CUP LEK RST (MISCELLANEOUS) ×3 IMPLANT
COVER MAYO STAND STRL (DRAPES) ×9 IMPLANT
DRAIN 1/8 RD END PERF LFSIL ST (DRAIN) IMPLANT
DRAPE NEUROLOGICAL W/INCISE (DRAPES) ×3 IMPLANT
DRAPE UTILITY 15X26 TOWEL STRL (DRAPES) ×6 IMPLANT
DRSG TEGADERM 4X10 (GAUZE/BANDAGES/DRESSINGS) IMPLANT
DRSG TEGADERM 4X4.75 (GAUZE/BANDAGES/DRESSINGS) ×3 IMPLANT
DRSG TELFA 3X8 NADH STRL (GAUZE/BANDAGES/DRESSINGS) ×6 IMPLANT
ELECTRODE REM PT RTRN 9FT ADLT (ELECTROSURGICAL) ×3 IMPLANT
EVACUATOR SILICONE 100CC (DRAIN) IMPLANT
FEE COVERAGE SUPPORT O-ARM (MISCELLANEOUS) ×3 IMPLANT
FORCEPS BIPOLAR SPETZLER 8 1.0 (NEUROSURGERY SUPPLIES) ×3 IMPLANT
GAUZE 4X4 16PLY ~~LOC~~+RFID DBL (SPONGE) IMPLANT
GAUZE PAD ABD 8X10 STRL (GAUZE/BANDAGES/DRESSINGS) IMPLANT
GAUZE SPONGE 4X4 12PLY STRL (GAUZE/BANDAGES/DRESSINGS) IMPLANT
GLOVE BIOGEL M SZ8.5 STRL (GLOVE) ×6 IMPLANT
GLOVE EXAM NITRILE XL STR (GLOVE) IMPLANT
GOWN STRL REUS W/ TWL LRG LVL3 (GOWN DISPOSABLE) IMPLANT
GOWN STRL REUS W/ TWL XL LVL3 (GOWN DISPOSABLE) IMPLANT
GOWN STRL SURGICAL XL XLNG (GOWN DISPOSABLE) ×3 IMPLANT
KIT BASIN OR (CUSTOM PROCEDURE TRAY) ×3 IMPLANT
KIT NDL BIOPSY PASSIVE (NEEDLE) IMPLANT
KIT NEEDLE BIOPSY PASSIVE (NEEDLE) ×2 IMPLANT
KIT TURNOVER KIT B (KITS) ×3 IMPLANT
MARKER SKIN DUAL TIP RULER LAB (MISCELLANEOUS) IMPLANT
MARKER SPHERE PSV REFLC NDI (MISCELLANEOUS) ×9 IMPLANT
NDL HYPO 18GX1.5 BLUNT FILL (NEEDLE) ×3 IMPLANT
NEEDLE HYPO 18GX1.5 BLUNT FILL (NEEDLE) ×2 IMPLANT
NS IRRIG 1000ML POUR BTL (IV SOLUTION) ×3 IMPLANT
PACK CRANIOTOMY CUSTOM (CUSTOM PROCEDURE TRAY) ×3 IMPLANT
PATTIES SURGICAL .5 X.5 (GAUZE/BANDAGES/DRESSINGS) ×3 IMPLANT
PATTIES SURGICAL .5 X3 (DISPOSABLE) IMPLANT
PATTIES SURGICAL 1X1 (DISPOSABLE) ×3 IMPLANT
PIN MAYFIELD SKULL DISP (PIN) IMPLANT
POINTER NAVIGATION AXIEM (INSTRUMENTS) ×3 IMPLANT
POINTER TRACER AXIEM (INSTRUMENTS) IMPLANT
SOL PREP POV-IOD 4OZ 10% (MISCELLANEOUS) ×3 IMPLANT
SPECIMEN JAR SMALL (MISCELLANEOUS) IMPLANT
SPIKE FLUID TRANSFER (MISCELLANEOUS) ×3 IMPLANT
SPONGE NEURO XRAY DETECT 1X3 (DISPOSABLE) IMPLANT
SPONGE SURGIFOAM ABS GEL 100C (HEMOSTASIS) IMPLANT
STAPLER SKIN PROX 35W (STAPLE) ×3 IMPLANT
SURGIFLO W/THROMBIN 8M KIT (HEMOSTASIS) IMPLANT
SUT PROLENE 5 0 RB 2 (SUTURE) IMPLANT
SUT VIC AB 0 CT2 8-18 (SUTURE) ×3 IMPLANT
SYR 5ML LUER SLIP (SYRINGE) ×3 IMPLANT
SYR CONTROL 10ML LL (SYRINGE) ×3 IMPLANT
TOWEL GREEN STERILE (TOWEL DISPOSABLE) ×3 IMPLANT
TOWEL GREEN STERILE FF (TOWEL DISPOSABLE) ×3 IMPLANT
TRACKER ENT PATIENT (MISCELLANEOUS) IMPLANT
TRAY FOLEY MTR SLVR 16FR STAT (SET/KITS/TRAYS/PACK) IMPLANT
WATER STERILE IRR 1000ML POUR (IV SOLUTION) ×3 IMPLANT

## 2024-07-30 NOTE — Consult Note (Addendum)
 NAME:  Joshua Harmon, MRN:  [id number], DOB:  Mar 27, 1961, LOS: 4 ADMISSION DATE:  07/26/2024, CONSULTATION DATE:  07/30/2024 REFERRING MD:  Rosslyn LOOSE CHIEF COMPLAINT:  brain mass    History of Present Illness:  63 year old male with past medical history of GERD who presented to the emergency department on 07/25/24 with weakness. Appears he was previously evaluated on 05/07/24 and found to have brain mass on CT; however, patient left AMA.   Reportedly having increased leg weakness with multiple falls. He had repeated head CT showing 4.9 x 3.7 cm lesion within right frontoparietal white matter. MR Brain obtained which showed the mass with mild peritumoral edema without midline shift. He underwent further MRI of the C and T spine with no masses or bony lesions. Additionally, he had CT chest, abdomen, pelvis with no metastatic disease. NSGY was consulted and patient admitted to hospitalist for further work up.   Pertinent  Medical History  GERD  Significant Hospital Events: Including procedures, antibiotic start and stop dates in addition to other pertinent events   9/3: admit for brain mass  9/5: seen by Dr. Rosslyn and consented to biopsy  9/8: biopsy performed, admit ICU post-op  Interim History / Subjective:  No pain, awake and oriented   Objective   Blood pressure 104/88, pulse 98, temperature 98.8 F (37.1 C), resp. rate 17, height 6' 1 (1.854 m), weight 64.4 kg, SpO2 93%.        Intake/Output Summary (Last 24 hours) at 07/30/2024 1631 Last data filed at 07/30/2024 1545 Gross per 24 hour  Intake 620 ml  Output 755 ml  Net -135 ml   Filed Weights   07/30/24 1220  Weight: 64.4 kg    Examination: General: middle aged male, laying in bed, no acute distress HENT: post-operative dressing to R parietal scalp, pupils equal and reactive; poor dentition Lungs: clear, room air Cardiovascular: s1s2, no murmur Abdomen: flat, soft  Extremities: no edema Neuro: awake, alert, oriented.  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 Found to have 4.9 x 3.7 cm lesion within right frontoparietal white matter with peritumoral edema without midline shift.  - Brain mass [G93.89] Brain tumor (HCC) [D49.6] S/P stereotactic biopsy on @TODAY @ by Dr. Janjua  - ICU Admit - Neuro checks q1h x 4 hours then q2h during day, q4h at night while asleep -Standing tylenol  -Decadron  taper  - cefazolin  per nsgy  - f/u culture data taken from biopsy  -Fentanyl  for breakthrough pain -Zofran  for nausea -Labetalol  as needed for SBP > 180 -Swallow screen and start diet -Standing bowel regimen both here and home -CT head wo contrast post-op imaging 9/9 AM -Progressive mobility -Remove foley, drain, arterial line tomorrow am  GERD - ppi   Labs   CBC: Recent Labs  Lab 07/25/24 1002 07/26/24 0445 07/27/24 0343  WBC 5.7 5.9 6.9  NEUTROABS 3.7  --   --   HGB 15.5 14.9 15.2  HCT 46.7 43.9 45.5  MCV 91.7 91.5 91.4  PLT 308 299 310    Basic Metabolic Panel: Recent Labs  Lab 07/25/24 1002 07/26/24 0445 07/27/24 0343  NA 138 137 141  K 3.8 4.1 4.5  CL 102 101 104  CO2 26 26 25   GLUCOSE 113* 100* 101*  BUN 11 9 10   CREATININE  0.79 0.72 0.91  CALCIUM 9.4 8.9 9.3   GFR: Estimated Creatinine Clearance: 77.6 mL/min (by C-G formula based on SCr of 0.91 mg/dL). Recent Labs  Lab 07/25/24 1002 07/26/24 0445 07/27/24 0343  WBC 5.7 5.9 6.9    Liver Function Tests: Recent Labs  Lab 07/25/24 1002 07/27/24 0343  AST 25 17  ALT 18 15  ALKPHOS 54 54  BILITOT 0.9 0.7  PROT 7.3 6.6  ALBUMIN 3.8 3.2*   No results for input(s): LIPASE, AMYLASE in the last 168 hours. No results for input(s): AMMONIA in the last 168 hours.  ABG No results found for: PHART,  PCO2ART, PO2ART, HCO3, TCO2, ACIDBASEDEF, O2SAT   Coagulation Profile: No results for input(s): INR, PROTIME in the last 168 hours.  Cardiac Enzymes: No results for input(s): CKTOTAL, CKMB, CKMBINDEX, TROPONINI in the last 168 hours.  HbA1C: No results found for: HGBA1C  CBG: Recent Labs  Lab 07/29/24 1207  GLUCAP 112*    Review of Systems:   As above  Past Medical History:  He,  has a past medical history of GERD (gastroesophageal reflux disease).   Surgical History:   Past Surgical History:  Procedure Laterality Date   LEG SURGERY Bilateral as a child   XI ROBOTIC ASSISTED INGUINAL HERNIA REPAIR WITH MESH Right 02/19/2020   Procedure: XI ROBOTIC ASSISTED Right INGUINAL HERNIA REPAIR WITH MESH;  Surgeon: Jordis Laneta FALCON, MD;  Location: [facility] ORS;  Service: General;  Laterality: Right;     Social History:   reports that he has been smoking cigarettes. He has never used smokeless tobacco. He reports that he does not currently use alcohol after a past usage of about 10.0 standard drinks of alcohol per week. He reports that he does not currently use drugs.   Family History:  His Family history is unknown by patient.   Allergies No Known Allergies   Home Medications  Prior to Admission medications   Medication Sig Start Date End Date Taking? Authorizing Provider  polyethylene glycol (MIRALAX  / GLYCOLAX ) 17 g packet Take 17 g by mouth daily. Mix one tablespoon with 8oz of your favorite juice or water every day until you are having soft formed stools. Then start taking once daily if you didn't have a stool the day before. Patient not taking: Reported on 02/19/2020 01/29/20   Lang Dover, MD     Critical care time: 57   Tinnie FORBES Adolph DEVONNA [facility] 07/30/24 4:51 PM  Please see [email or web address] for pager details.  From 7A-7P if no response, please call [phone] After hours, please call ELink [phone]

## 2024-07-30 NOTE — Transfer of Care (Signed)
 Immediate Anesthesia Transfer of Care Note  Patient: Joshua Harmon  Procedure(s) Performed: RIGHT STEREOTACTIC BIOPSY (Right: Head) COMPUTER-ASSISTED NAVIGATION, FOR CRANIAL PROCEDURE (Right)  Patient Location: PACU  Anesthesia Type:General  Level of Consciousness: drowsy and patient cooperative  Airway & Oxygen Therapy: Patient Spontanous Breathing  Post-op Assessment: Report given to RN and Post -op Vital signs reviewed and stable  Post vital signs: Reviewed and stable  Last Vitals:  Vitals Value Taken Time  BP 109/88 07/30/24 15:51  Temp    Pulse 102 07/30/24 15:56  Resp 10 07/30/24 15:56  SpO2 93 % 07/30/24 15:56  Vitals shown include unfiled device data.  Last Pain:  Vitals:   07/30/24 1233  TempSrc:   PainSc: 0-No pain         Complications: No notable events documented.

## 2024-07-30 NOTE — Anesthesia Procedure Notes (Signed)
 Procedure Name: Intubation Date/Time: 07/30/2024 1:50 PM  Performed by: Virgil Ee, CRNAPre-anesthesia Checklist: Patient identified, Patient being monitored, Timeout performed, Emergency Drugs available and Suction available Patient Re-evaluated:Patient Re-evaluated prior to induction Oxygen Delivery Method: Circle system utilized Preoxygenation: Pre-oxygenation with 100% oxygen Induction Type: IV induction Ventilation: Mask ventilation without difficulty Laryngoscope Size: Mac and 4 Grade View: Grade I Tube type: Oral Tube size: 7.5 mm Number of attempts: 1 Airway Equipment and Method: Stylet Placement Confirmation: ETT inserted through vocal cords under direct vision, positive ETCO2 and breath sounds checked- equal and bilateral Secured at: 23 cm Tube secured with: Tape Dental Injury: Teeth and Oropharynx as per pre-operative assessment

## 2024-07-30 NOTE — Progress Notes (Signed)
 First attempt to obtain report from [id number] RN.

## 2024-07-30 NOTE — Op Note (Signed)
 DATE OF SURGERY: 07/30/2024   ATTENDING SURGEON: Dino Sable, MD   ASSISTANT: None   PREOPERATIVE DIAGNOSIS: Brain Tumor   POSTOPERATIVE DIAGNOSIS: Same    PROCEDURE PERFORMED:  1. RIGHT stereotactic brain biopsy 2. Use of BrainLab neuro navigation for localization of biopsy/tumor  ANESTHESIA: General endotracheal anesthesia.     ESTIMATED BLOOD LOSS, URINE OUTPUT, AND CRYSTALLOIDS:   See anesthesia chart.     COMPLICATIONS: None.     SPECIMENS: Tumor Cultures x2   DRAINS: None    PREOPERATIVE COURSE:   63 year old gentleman with a history of left sided weakness who came to our institution and was found to have an enhancing mass in the right frontal lobe.  Options were discussed with the patient and family of doing nothing versus biopsy and they opted for the latter.  The risk discussed included but not limited to infection, hemorrhage, stroke, paralysis, blindness, speech impairment, seizures, DVT/PE, cardiopulmonary complications, false-negative biopsy and death amongst others.  All their questions were answered to their satisfaction as voiced by them and they requested for us  to proceed.   DESCRIPTION OF PROCEDURE: Patient was brought to the operating room and after general endotracheal anesthesia was inserted patient placed in a supine position with the head slightly flexed and secured in the [place] head holder.  Registration was obtained after which an entry point was chosen and the patient was prepped and draped in usual sterile fashion.  The outline was then injected with lidocaine  with epinephrine  and using a #10 blade an incision was made and retractor placed.  A bur hole was made and the dura was then cauterized and cut in a cruciate fashion.  The pia was cauterized and the biopsy arm was brought in and under navigation the biopsy localization was obtained and the needle was passed to the focus and circumferential biopsies were taken.  Thereafter, frozen section  demonstrated lesional tissue and the needle was withdrawn after which copious irrigation was performed and the incision was closed in separate layers. Two cultures were taken from the permanent sections to rule out an infection.  At the end of the procedure all counts were complete.    It should be noted that Stereotactic computer-assisted navigation was indicated due to patient's anatomy. Films were reviewed for preop planning. Registration was personally performed by the me. Visual confirmation was done. Navigation was utilized during different portions of the surgery, including on entry to the brain and localization of the tumor.

## 2024-07-30 NOTE — Plan of Care (Signed)
  Problem: Health Behavior/Discharge Planning: Goal: Ability to manage health-related needs will improve Outcome: Progressing   Problem: Clinical Measurements: Goal: Will remain free from infection Outcome: Progressing   Problem: Activity: Goal: Risk for activity intolerance will decrease Outcome: Progressing   Problem: Nutrition: Goal: Adequate nutrition will be maintained Outcome: Progressing   Problem: Elimination: Goal: Will not experience complications related to bowel motility Outcome: Progressing   Problem: Safety: Goal: Ability to remain free from injury will improve Outcome: Progressing   Problem: Skin Integrity: Goal: Risk for impaired skin integrity will decrease Outcome: Progressing

## 2024-07-30 NOTE — H&P (Signed)
 63yo gentleman with a RIGHT brain tumor  Past Medical History:  Diagnosis Date   GERD (gastroesophageal reflux disease)    Past Surgical History:  Procedure Laterality Date   LEG SURGERY Bilateral as a child   XI ROBOTIC ASSISTED INGUINAL HERNIA REPAIR WITH MESH Right 02/19/2020   Procedure: XI ROBOTIC ASSISTED Right INGUINAL HERNIA REPAIR WITH MESH;  Surgeon: Jordis Laneta FALCON, MD;  Location: [facility] ORS;  Service: General;  Laterality: Right;   Social History   Socioeconomic History   Marital status: Married    Spouse name: Not on file   Number of children: Not on file   Years of education: Not on file   Highest education level: Not on file  Occupational History   Not on file  Tobacco Use   Smoking status: Every Day    Current packs/day: 0.25    Types: Cigarettes   Smokeless tobacco: Never  Vaping Use   Vaping status: Never Used  Substance and Sexual Activity   Alcohol use: Not Currently    Alcohol/week: 10.0 standard drinks of alcohol    Types: 10 Cans of beer per week   Drug use: Not Currently   Sexual activity: Yes    Partners: Female  Other Topics Concern   Not on file  Social History Narrative   Not on file   Social Drivers of Health   Financial Resource Strain: Medium Risk (05/07/2024)   Received from [facility]   Overall Financial Resource Strain (CARDIA)    Difficulty of Paying Living Expenses: Somewhat hard  Food Insecurity: No Food Insecurity (07/27/2024)   Hunger Vital Sign    Worried About Running Out of Food in the Last Year: Never true    Ran Out of Food in the Last Year: Never true  Recent Concern: Food Insecurity - Food Insecurity Present (05/07/2024)   Received from [facility]   Hunger Vital Sign    Within the past 12 months, you worried that your food would run out before you got the money to buy more.: Sometimes true    Ran Out of Food in the Last Year: Not on file  Transportation Needs: No Transportation Needs  (07/27/2024)   PRAPARE - Administrator, Civil Service (Medical): No    Lack of Transportation (Non-Medical): No  Recent Concern: Transportation Needs - Unmet Transportation Needs (05/07/2024)   Received from [facility] - Transportation    In the past 12 months, has lack of transportation kept you from medical appointments or from getting medications?: No    Lack of Transportation (Non-Medical): Yes  Physical Activity: Not on file  Stress: Not on file  Social Connections: Unknown (07/25/2024)   Social Connection and Isolation Panel    Frequency of Communication with Friends and Family: Patient declined    Frequency of Social Gatherings with Friends and Family: Patient declined    Attends Religious Services: Patient declined    Database administrator or Organizations: Patient declined    Attends Banker Meetings: Patient declined    Marital Status: Living with partner  Intimate Partner Violence: Not At Risk (07/27/2024)   Humiliation, Afraid, Rape, and Kick questionnaire    Fear of Current or Ex-Partner: No    Emotionally Abused: No    Physically Abused: No    Sexually Abused: No   Family History  Family history unknown: Yes   No Known Allergies Scheduled Meds:  [MAR Hold]  mupirocin  ointment  1 Application Nasal BID   remifentanil  (ULTIVA ) 2 mg in 100 mL normal saline (20 mcg/mL) Optime  0.0125 mcg/kg/min Intravenous To OR   Continuous Infusions:  sodium chloride  10 mL/hr at 07/30/24 1237   ceFAZolin      PRN Meds:.[MAR Hold] acetaminophen  **OR** [MAR Hold] acetaminophen , [MAR Hold] ondansetron  **OR** [MAR Hold] ondansetron  (ZOFRAN ) IV Vitals:   07/30/24 1120 07/30/24 1220  BP: 115/84 (!) 113/92  Pulse: 75 78  Resp: 18 18  Temp: 98.3 F (36.8 C) 98.3 F (36.8 C)  SpO2: 97% 97%   Physical Exam HENT:     Head: Normocephalic.     Nose: Nose normal.  Eyes:     Pupils: Pupils are equal, round, and reactive to light.   Cardiovascular:     Rate and Rhythm: Normal rate.  Pulmonary:     Effort: Pulmonary effort is normal.  Abdominal:     General: Abdomen is flat.  Musculoskeletal:     Cervical back: Normal range of motion.  Neurological:     Mental Status: He is alert.   ASSESSMENT: RIGHT BRAIN TUMOR  PLAN: RIGHT STEREOTACTIC BRAIN BIOPSY

## 2024-07-30 NOTE — Progress Notes (Signed)
 PT Cancellation Note  Patient Details Name: JAYLAN HINOJOSA MRN: [id number] DOB: 1961-01-02   Cancelled Treatment:    Reason Eval/Treat Not Completed: (P) Patient at procedure or test/unavailable (Pt off floor for procedure. Will follow up as able.)   Darryle George 07/30/2024, 3:44 PM

## 2024-07-30 NOTE — Anesthesia Preprocedure Evaluation (Signed)
 Anesthesia Evaluation  Patient identified by MRN, date of birth, ID band Patient awake    Reviewed: Allergy & Precautions, NPO status , Patient's Chart, lab work & pertinent test results, reviewed documented beta blocker date and time   History of Anesthesia Complications Negative for: history of anesthetic complications  Airway Mallampati: II  TM Distance: >3 FB     Dental  (+) Loose, Poor Dentition, Missing,    Pulmonary neg COPD, Current Smoker and Patient abstained from smoking.   breath sounds clear to auscultation       Cardiovascular (-) angina (-) CAD, (-) Past MI and (-) Cardiac Stents  Rhythm:Regular Rate:Normal     Neuro/Psych neg Seizures Right frontoparietal mass    GI/Hepatic ,GERD  ,,(+) neg Cirrhosis        Endo/Other    Renal/GU Renal disease     Musculoskeletal   Abdominal   Peds  Hematology   Anesthesia Other Findings   Reproductive/Obstetrics                              Anesthesia Physical Anesthesia Plan  ASA: 3  Anesthesia Plan: General   Post-op Pain Management:    Induction: Intravenous  PONV Risk Score and Plan: 2 and Ondansetron  and Dexamethasone   Airway Management Planned: Oral ETT  Additional Equipment:   Intra-op Plan:   Post-operative Plan: Extubation in OR  Informed Consent: I have reviewed the patients History and Physical, chart, labs and discussed the procedure including the risks, benefits and alternatives for the proposed anesthesia with the patient or authorized representative who has indicated his/her understanding and acceptance.     Dental advisory given  Plan Discussed with: CRNA  Anesthesia Plan Comments:          Anesthesia Quick Evaluation

## 2024-07-30 NOTE — Progress Notes (Signed)
 PROGRESS NOTE  Joshua Harmon  DOB: November 27, 1960  PCP: Patient, No Pcp Per FMW:[id number]  DOA: 07/26/2024  LOS: 4 days  Hospital Day: 5  Brief narrative: Joshua Harmon is a 63 y.o. 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 9/3, patient presented to ED at [facility] with complaint of progressive left-sided weakness over the past several weeks to months, leading to falls.  Also reported intermittent confusion.  Fianc reported that patient's thinking is not quite the same as it used to be.  He had an outpatient appointment with neurology on 9/3 but with the concern of worsening symptoms, was brought to the ED .  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 MRI brain showed peripheral enhancing mass in right frontotemporal lobe 4.5 x 4.5 x 2.9 cm with mild associated peritumoral edema.  Causing mass effect without significant midline shift. MRI C-spine cervical spondylosis, multifactorial severe spinal canal stenosis with bilateral neural foraminal narrowing. MRI T-spine negative Seen by neurosurgery CT chest abdomen/pelvis with no evidence of primary malignancy/metastatic disease. Recommended biopsy of the intracranial lesion and hence transferred to [facility]  Subjective: Patient was seen and examined this morning prior to brain biopsy No new symptoms.  Assessment and plan: Right frontoparietal brain mass Imagings as above Seen by neurosurgery, pending brain biopsy tomorrow  N.p.o. after midnight.  Not on any heparin product Per neurosurgery recommendation, no need of steroids since he does not have significant swelling. Seizure prophylaxis none -defer to neurosurgery  Colonic diverticulosis Recommend regular bowel regimen  Avascular necrosis of left femur head Symptoms probably related to childhood  polio  Smoking Counseled to quit Nicotine  patch offered   Mobility:  PT Orders:   PT Follow up Rec: Acute Inpatient Rehab (3hours/Day)07/27/2024 1600   Goals of care   Code Status: Full Code     DVT prophylaxis: Ordered Place TED hose Start: 07/30/24 0050 SCDs Start: 07/26/24 2237   Antimicrobials: None Fluid: None Consultants: Neurosurgery Family Communication: Fianc at bedside  Status: Inpatient Level of care:  ICU   Patient is from: Home Needs to continue in-hospital care: Pending brain biopsy today Anticipated d/c to: PT recommended CIR      Diet:  Diet Order             Diet NPO time specified  Diet effective midnight                   Scheduled Meds:  [MAR Hold] mupirocin  ointment  1 Application Nasal BID   remifentanil  (ULTIVA ) 2 mg in 100 mL normal saline (20 mcg/mL) Optime  0.0125 mcg/kg/min Intravenous To OR    PRN meds: [MAR Hold] acetaminophen  **OR** [MAR Hold] acetaminophen , [MAR Hold] ondansetron  **OR** [MAR Hold] ondansetron  (ZOFRAN ) IV   Infusions:   sodium chloride  10 mL/hr at 07/30/24 1237   ceFAZolin       Antimicrobials: Anti-infectives (From admission, onward)    Start     Dose/Rate Route Frequency Ordered Stop   07/30/24 0800  ceFAZolin  (ANCEF ) IVPB 2g/100 mL premix        2 g 200 mL/hr over 30 Minutes Intravenous  Once 07/30/24 0049         Objective: Vitals:   07/30/24 1120 07/30/24 1220  BP: 115/84 (!) 113/92  Pulse: 75 78  Resp: 18 18  Temp: 98.3 F (36.8 C) 98.3 F (  36.8 C)  SpO2: 97% 97%    Intake/Output Summary (Last 24 hours) at 07/30/2024 1409 Last data filed at 07/30/2024 1100 Gross per 24 hour  Intake 120 ml  Output 750 ml  Net -630 ml   Filed Weights   07/30/24 1220  Weight: 64.4 kg   Weight change:  Body mass index is 18.73 kg/m.   Physical Exam: General exam: Pleasant, African-American male Skin: No rashes, lesions or ulcers. HEENT: Atraumatic, normocephalic, no obvious  bleeding Lungs: Clear to auscultation bilaterally,  CVS: S1, S2, no murmur,   GI/Abd: Soft, nontender, nondistended, bowel sound present,   CNS: Alert, awake, oriented x 3 Psychiatry: Mood appropriate Extremities: No pedal edema, no calf tenderness,   Data Review: I have personally reviewed the laboratory data and studies available.  F/u labs  Unresulted Labs (From admission, onward)    None      Signed, Chapman Rota, MD [facility] 07/30/2024

## 2024-07-31 ENCOUNTER — Encounter (HOSPITAL_COMMUNITY): Payer: Self-pay | Admitting: Neurosurgery

## 2024-07-31 ENCOUNTER — Inpatient Hospital Stay (HOSPITAL_COMMUNITY)

## 2024-07-31 DIAGNOSIS — G9389 Other specified disorders of brain: Secondary | ICD-10-CM | POA: Diagnosis not present

## 2024-07-31 LAB — GLUCOSE, CAPILLARY
Glucose-Capillary: 136 mg/dL — ABNORMAL HIGH (ref 70–99)
Glucose-Capillary: 156 mg/dL — ABNORMAL HIGH (ref 70–99)
Glucose-Capillary: 265 mg/dL — ABNORMAL HIGH (ref 70–99)
Glucose-Capillary: 152 mg/dL — ABNORMAL HIGH (ref 70–99)
Glucose-Capillary: 168 mg/dL — ABNORMAL HIGH (ref 70–99)

## 2024-07-31 MED ORDER — DEXAMETHASONE 4 MG PO TABS
4.0000 mg | ORAL_TABLET | Freq: Four times a day (QID) | ORAL | Status: AC
  Administered 2024-07-31 – 2024-08-01 (×4): 4 mg via ORAL
  Filled 2024-07-31 (×3): qty 1

## 2024-07-31 MED ORDER — PANTOPRAZOLE SODIUM 40 MG PO TBEC
40.0000 mg | DELAYED_RELEASE_TABLET | Freq: Every day | ORAL | Status: DC
Start: 2024-07-31 — End: 2024-08-03
  Administered 2024-07-31 – 2024-08-02 (×3): 40 mg via ORAL
  Filled 2024-07-31 (×3): qty 1

## 2024-07-31 MED ORDER — DEXAMETHASONE 4 MG PO TABS
4.0000 mg | ORAL_TABLET | Freq: Three times a day (TID) | ORAL | Status: DC
  Administered 2024-08-02 – 2024-08-03 (×5): 4 mg via ORAL
  Filled 2024-07-31 (×6): qty 1

## 2024-07-31 NOTE — Anesthesia Postprocedure Evaluation (Signed)
 Anesthesia Post Note  Patient: Joshua Harmon  Procedure(s) Performed: RIGHT STEREOTACTIC BIOPSY (Right: Head) COMPUTER-ASSISTED NAVIGATION, FOR CRANIAL PROCEDURE (Right)     Patient location during evaluation: PACU Anesthesia Type: General Level of consciousness: awake and alert Pain management: pain level controlled Vital Signs Assessment: post-procedure vital signs reviewed and stable Respiratory status: spontaneous breathing, nonlabored ventilation, respiratory function stable and patient connected to nasal cannula oxygen Cardiovascular status: blood pressure returned to baseline and stable Postop Assessment: no apparent nausea or vomiting Anesthetic complications: no   No notable events documented.  Last Vitals:  Vitals:   07/31/24 1600 07/31/24 1714  BP: (!) 119/92 110/86  Pulse: 99 95  Resp: (!) 21 18  Temp:  36.7 C  SpO2: 93% 99%    Last Pain:  Vitals:   07/31/24 1714  TempSrc: Oral  PainSc: 0-No pain   Pain Goal:                   Garnette DELENA Gab

## 2024-07-31 NOTE — Progress Notes (Signed)
   Inpatient Rehab Admissions Coordinator :  Per therapy recommendations, patient was screened for CIR candidacy by Heron Leavell RN MSN.  At this time patient appears to be a potential candidate for CIR. I will place a rehab consult per protocol for full assessment. Please call me with any questions. Clotilda with [employer] [place] to be contacted by [facility] admissions coordinator of our assessment. [phone] if we would want to pursue CIR admit.   Heron Leavell RN MSN Admissions Coordinator [phone]

## 2024-07-31 NOTE — Progress Notes (Signed)
 Physical Therapy Treatment Patient Details Name: Joshua Harmon MRN: [id number] DOB: [birth year] Today's Date: 07/31/2024   History of Present Illness 63 y.o. male on 9/3, presented to ED at [facility] with complaint of progressive left-sided weakness over the past several weeks to months, leading to falls. Also reported intermittent confusion. CT head showed a 4.9 x 3.7 cm lesion centered within the right frontoparietal white matter suspicious for high-grade primary CNS neoplasm/metastatic. PMH significant for polio as a child, h/o inguinal hernia s/p robotic assisted repair.    PT Comments  Pt tolerates treatment well but demonstrates more significant deficits in mobility and motor control than previous session. With initial bed mobility pt appears to have increased extensor tone at hips and knees, with difficulty flexing. Pt requires significant physical assistance to stand, unable to safely progress to gait training at this time with +1 assistance. Patient will benefit from intensive inpatient follow-up therapy, >3 hours/day.    If plan is discharge home, recommend the following: A lot of help with walking and/or transfers;A little help with bathing/dressing/bathroom;Assistance with cooking/housework;Direct supervision/assist for medications management;Direct supervision/assist for financial management;Assist for transportation;Help with stairs or ramp for entrance;Supervision due to cognitive status   Can travel by private vehicle        Equipment Recommendations  Wheelchair (measurements PT);Wheelchair cushion (measurements PT)    Recommendations for Other Services       Precautions / Restrictions Precautions Precautions: Fall Recall of Precautions/Restrictions: Impaired Restrictions Weight Bearing Restrictions Per Provider Order: No     Mobility  Bed Mobility Overal bed mobility: Needs Assistance Bed Mobility: Supine to Sit     Supine to sit: Min assist, HOB elevated, Used rails      General bed mobility comments: pt sliding to the bed, posterior lean and hip extension noted. initially difficult for pt to flex at hips/knees    Transfers Overall transfer level: Needs assistance Equipment used: 1 person hand held assist Transfers: Sit to/from Stand, Bed to chair/wheelchair/BSC Sit to Stand: Max assist Stand pivot transfers: Max assist              Ambulation/Gait                   Stairs             Wheelchair Mobility     Tilt Bed    Modified Rankin (Stroke Patients Only) Modified Rankin (Stroke Patients Only) Pre-Morbid Rankin Score: No symptoms Modified Rankin: Severe disability     Balance Overall balance assessment: Needs assistance Sitting-balance support: Single extremity supported, Feet supported Sitting balance-Leahy Scale: Poor Sitting balance - Comments: CGA-minA Postural control: Posterior lean Standing balance support: Bilateral upper extremity supported Standing balance-Leahy Scale: Poor                              Communication Communication Communication: No apparent difficulties  Cognition Arousal: Alert Behavior During Therapy: WFL for tasks assessed/performed   PT - Cognitive impairments: Awareness, Problem solving, Safety/Judgement                       PT - Cognition Comments: pt with poor awareness of mobility deficits, wanting to go into the bathroom to shower despite having significant difficulty standing Following commands: Impaired Following commands impaired: Follows one step commands with increased time, Follows multi-step commands inconsistently    Cueing Cueing Techniques: Verbal cues, Visual cues, Tactile cues  Exercises  General Comments General comments (skin integrity, edema, etc.): VSS on RA      Pertinent Vitals/Pain Pain Assessment Pain Assessment: No/denies pain    Home Living                          Prior Function            PT  Goals (current goals can now be found in the care plan section) Acute Rehab PT Goals Patient Stated Goal: Get well Progress towards PT goals: Not progressing toward goals - comment (some regression noted after surgery, POC remains appropriate)    Frequency    Min 3X/week      PT Plan      Co-evaluation              AM-PAC PT 6 Clicks Mobility   Outcome Measure  Help needed turning from your back to your side while in a flat bed without using bedrails?: A Little Help needed moving from lying on your back to sitting on the side of a flat bed without using bedrails?: A Little Help needed moving to and from a bed to a chair (including a wheelchair)?: A Lot Help needed standing up from a chair using your arms (e.g., wheelchair or bedside chair)?: A Lot Help needed to walk in hospital room?: Total Help needed climbing 3-5 steps with a railing? : Total 6 Click Score: 12    End of Session Equipment Utilized During Treatment: Gait belt Activity Tolerance: Patient tolerated treatment well Patient left: in chair;with call bell/phone within reach;with chair alarm set Nurse Communication: Mobility status PT Visit Diagnosis: Unsteadiness on feet (R26.81);Other abnormalities of gait and mobility (R26.89);Repeated falls (R29.6);History of falling (Z91.81);Ataxic gait (R26.0);Difficulty in walking, not elsewhere classified (R26.2);Other symptoms and signs involving the nervous system (R29.898);Hemiplegia and hemiparesis Hemiplegia - Right/Left: Left Hemiplegia - dominant/non-dominant: Non-dominant     Time: 8860-8793 PT Time Calculation (min) (ACUTE ONLY): 27 min  Charges:    $Therapeutic Activity: 23-37 mins PT General Charges $$ ACUTE PT VISIT: 1 Visit                     Bernardino JINNY Ruth, PT, DPT Acute Rehabilitation Office [phone]    Bernardino JINNY Ruth 07/31/2024, 12:43 PM

## 2024-07-31 NOTE — Progress Notes (Signed)
 NAME:  Joshua Harmon, MRN:  [id number], DOB:  11/28/1960, LOS: 5 ADMISSION DATE:  07/26/2024, CONSULTATION DATE:  07/30/2024 REFERRING MD:  Rosslyn LOOSE CHIEF COMPLAINT:  brain mass    History of Present Illness:  63 year old male with past medical history of GERD who presented to the emergency department on 07/25/24 with weakness. Appears he was previously evaluated on 05/07/24 and found to have brain mass on CT; however, patient left AMA.   Reportedly having increased leg weakness with multiple falls. He had repeated head CT showing 4.9 x 3.7 cm lesion within right frontoparietal white matter. MR Brain obtained which showed the mass with mild peritumoral edema without midline shift. He underwent further MRI of the C and T spine with no masses or bony lesions. Additionally, he had CT chest, abdomen, pelvis with no metastatic disease. NSGY was consulted and patient admitted to hospitalist for further work up.   Pertinent  Medical History  GERD  Significant Hospital Events: Including procedures, antibiotic start and stop dates in addition to other pertinent events   9/3: admit for brain mass  9/5: seen by Dr. Rosslyn and consented to biopsy  9/8: biopsy performed, admit ICU post-op  Interim History / Subjective:  Will transfer out of ICU today, transfer to [facility]. Still needs PT/OT.   Objective   Blood pressure (!) 116/102, pulse 97, temperature 97.7 F (36.5 C), temperature source Axillary, resp. rate 20, height 6' 1 (1.854 m), weight 64.4 kg, SpO2 95%.        Intake/Output Summary (Last 24 hours) at 07/31/2024 0744 Last data filed at 07/31/2024 0700 Gross per 24 hour  Intake 950.14 ml  Output 1105 ml  Net -154.86 ml   Filed Weights   07/30/24 1220 07/31/24 0704  Weight: 64.4 kg 64.4 kg    Examination: General: middle aged male, laying in bed, no acute distress HENT: post-operative dressing to R parietal scalp, pupils equal and reactive; poor dentition Lungs: clear, room  air Cardiovascular: s1s2, no murmur Abdomen: flat, soft  Extremities: no edema Neuro: awake, alert, oriented. 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 Found to have 4.9 x 3.7 cm lesion within right frontoparietal white matter with peritumoral edema without midline shift.  - Brain mass [G93.89] Brain tumor (HCC) [D49.6] S/P stereotactic biopsy on 9/8 w/ Dr. Rosslyn  9/9 repeat head CT showing small volume postop hemorrhage along biopsy tract without other changes  - nsgy following, appreciate help in management  - decadron  taper, watch glucose  - cefazolin  per nsgy  - dc keppra  per Dr. Janjua   - pain control with tylenol , fioricet, fentanyl  - nausea control with zofran   - labetalol  PRN for SBP > 180  - PT/OT/SLP  - f/u culture data taken from biopsy - NGTD  GERD - ppi   Will transfer out of ICU today   Labs   CBC: Recent Labs  Lab 07/25/24 1002 07/26/24 0445 07/27/24 0343  WBC 5.7 5.9 6.9  NEUTROABS 3.7  --   --   HGB 15.5 14.9 15.2  HCT 46.7 43.9 45.5  MCV 91.7 91.5 91.4  PLT 308 299 310    Basic Metabolic Panel: Recent Labs  Lab 07/25/24 1002 07/26/24 0445 07/27/24 0343  NA 138 137 141  K 3.8 4.1 4.5  CL 102  101 104  CO2 26 26 25   GLUCOSE 113* 100* 101*  BUN 11 9 10   CREATININE 0.79 0.72 0.91  CALCIUM 9.4 8.9 9.3   GFR: Estimated Creatinine Clearance: 77.6 mL/min (by C-G formula based on SCr of 0.91 mg/dL). Recent Labs  Lab 07/25/24 1002 07/26/24 0445 07/27/24 0343  WBC 5.7 5.9 6.9    Liver Function Tests: Recent Labs  Lab 07/25/24 1002 07/27/24 0343  AST 25 17  ALT 18 15  ALKPHOS 54 54  BILITOT 0.9 0.7  PROT 7.3 6.6  ALBUMIN 3.8 3.2*   No results for input(s): LIPASE, AMYLASE in the last 168 hours. No results for  input(s): AMMONIA in the last 168 hours.  ABG No results found for: PHART, PCO2ART, PO2ART, HCO3, TCO2, ACIDBASEDEF, O2SAT   Coagulation Profile: No results for input(s): INR, PROTIME in the last 168 hours.  Cardiac Enzymes: No results for input(s): CKTOTAL, CKMB, CKMBINDEX, TROPONINI in the last 168 hours.  HbA1C: Hgb A1c MFr Bld  Date/Time Value Ref Range Status  07/30/2024 06:01 PM 5.8 (H) 4.8 - 5.6 % Final    Comment:    (NOTE) Diagnosis of Diabetes The following HbA1c ranges recommended by the American Diabetes Association (ADA) may be used as an aid in the diagnosis of diabetes mellitus.  Hemoglobin             Suggested A1C NGSP%              Diagnosis  <5.7                   Non Diabetic  5.7-6.4                Pre-Diabetic  >6.4                   Diabetic  <7.0                   Glycemic control for                       adults with diabetes.      CBG: Recent Labs  Lab 07/29/24 1207 07/30/24 2342 07/31/24 0349 07/31/24 0722  GLUCAP 112* 174* 136* 156*    Review of Systems:   As above  Past Medical History:  He,  has a past medical history of GERD (gastroesophageal reflux disease).   Surgical History:   Past Surgical History:  Procedure Laterality Date   LEG SURGERY Bilateral as a child   XI ROBOTIC ASSISTED INGUINAL HERNIA REPAIR WITH MESH Right 02/19/2020   Procedure: XI ROBOTIC ASSISTED Right INGUINAL HERNIA REPAIR WITH MESH;  Surgeon: Jordis Laneta FALCON, MD;  Location: [facility] ORS;  Service: General;  Laterality: Right;     Social History:   reports that he has been smoking cigarettes. He has never used smokeless tobacco. He reports that he does not currently use alcohol after a past usage of about 10.0 standard drinks of alcohol per week. He reports that he does not currently use drugs.   Family History:  His Family history is unknown by patient.   Allergies No Known Allergies   Home Medications  Prior to  Admission medications   Medication Sig Start Date End Date Taking? Authorizing Provider  polyethylene glycol (MIRALAX  / GLYCOLAX ) 17 g packet Take 17 g by mouth daily. Mix one tablespoon with 8oz of your favorite juice or water every day until you are having soft formed stools.  Then start taking once daily if you didn't have a stool the day before. Patient not taking: Reported on 02/19/2020 01/29/20   Lang Dover, MD     Critical care time: 61   Tinnie FORBES Adolph DEVONNA [facility] 07/31/24 7:44 AM  Please see [email or web address] for pager details.  From 7A-7P if no response, please call [phone] After hours, please call ELink [phone]

## 2024-07-31 NOTE — TOC Progression Note (Signed)
 Transition of Care [facility]) - Progression Note    Patient Details  Name: Joshua Harmon MRN: [id number] Date of Birth: [birth year]  Transition of [facility]) CM/SW Contact  Inocente GORMAN Kindle, LCSW Phone Number: 07/31/2024, 8:28 AM  Clinical Narrative:    Patient transferred to 4N. CSW continuing to follow for discharge needs.    Expected Discharge Plan: IP Rehab Facility Barriers to Discharge: Continued Medical Work up               Expected Discharge Plan and Services       Living arrangements for the past 2 months: Single Family Home                                       Social Drivers of Health (SDOH) Interventions SDOH Screenings   Food Insecurity: No Food Insecurity (07/27/2024)  Recent Concern: Food Insecurity - Food Insecurity Present (05/07/2024)   Received from [facility]  Housing: Low Risk  (07/27/2024)  Recent Concern: Housing - High Risk (05/07/2024)   Received from [facility]  Transportation Needs: No Transportation Needs (07/27/2024)  Recent Concern: Transportation Needs - Unmet Transportation Needs (05/07/2024)   Received from [facility]  Utilities: Not At Risk (07/27/2024)  Recent Concern: Utilities - At Risk (05/07/2024)   Received from [facility]  Financial Resource Strain: Medium Risk (05/07/2024)   Received from [facility]  Social Connections: Unknown (07/25/2024)  Tobacco Use: High Risk (07/30/2024)    Readmission Risk Interventions     No data to display

## 2024-08-01 ENCOUNTER — Other Ambulatory Visit: Payer: Self-pay | Admitting: Radiation Therapy

## 2024-08-01 DIAGNOSIS — G9389 Other specified disorders of brain: Secondary | ICD-10-CM | POA: Diagnosis not present

## 2024-08-01 DIAGNOSIS — C719 Malignant neoplasm of brain, unspecified: Secondary | ICD-10-CM

## 2024-08-01 LAB — CBC WITH DIFFERENTIAL/PLATELET
Abs Immature Granulocytes: 0.07 K/uL (ref 0.00–0.07)
Basophils Absolute: 0 K/uL (ref 0.0–0.1)
Basophils Relative: 0 %
Eosinophils Absolute: 0 K/uL (ref 0.0–0.5)
Eosinophils Relative: 0 %
HCT: 44.5 % (ref 39.0–52.0)
Hemoglobin: 15.3 g/dL (ref 13.0–17.0)
Immature Granulocytes: 0 %
Lymphocytes Relative: 8 %
Lymphs Abs: 1.3 K/uL (ref 0.7–4.0)
MCH: 31.3 pg (ref 26.0–34.0)
MCHC: 34.4 g/dL (ref 30.0–36.0)
MCV: 91 fL (ref 80.0–100.0)
Monocytes Absolute: 0.8 K/uL (ref 0.1–1.0)
Monocytes Relative: 5 %
Neutro Abs: 14.2 K/uL — ABNORMAL HIGH (ref 1.7–7.7)
Neutrophils Relative %: 87 %
Platelets: 357 K/uL (ref 150–400)
RBC: 4.89 MIL/uL (ref 4.22–5.81)
RDW: 13 % (ref 11.5–15.5)
WBC: 16.4 K/uL — ABNORMAL HIGH (ref 4.0–10.5)
nRBC: 0 % (ref 0.0–0.2)

## 2024-08-01 LAB — COMPREHENSIVE METABOLIC PANEL WITH GFR
ALT: 16 U/L (ref 0–44)
AST: 24 U/L (ref 15–41)
Albumin: 3.1 g/dL — ABNORMAL LOW (ref 3.5–5.0)
Alkaline Phosphatase: 53 U/L (ref 38–126)
Anion gap: 11 (ref 5–15)
BUN: 21 mg/dL (ref 8–23)
CO2: 24 mmol/L (ref 22–32)
Calcium: 9.2 mg/dL (ref 8.9–10.3)
Chloride: 100 mmol/L (ref 98–111)
Creatinine, Ser: 1 mg/dL (ref 0.61–1.24)
GFR, Estimated: >60 mL/min (ref >60)
Glucose, Bld: 134 mg/dL — ABNORMAL HIGH (ref 70–99)
Potassium: 4.1 mmol/L (ref 3.5–5.1)
Sodium: 135 mmol/L (ref 135–145)
Total Bilirubin: 0.5 mg/dL (ref 0.0–1.2)
Total Protein: 6.4 g/dL — ABNORMAL LOW (ref 6.5–8.1)

## 2024-08-01 LAB — GLUCOSE, CAPILLARY
Glucose-Capillary: 105 mg/dL — ABNORMAL HIGH (ref 70–99)
Glucose-Capillary: 124 mg/dL — ABNORMAL HIGH (ref 70–99)
Glucose-Capillary: 126 mg/dL — ABNORMAL HIGH (ref 70–99)
Glucose-Capillary: 127 mg/dL — ABNORMAL HIGH (ref 70–99)
Glucose-Capillary: 136 mg/dL — ABNORMAL HIGH (ref 70–99)
Glucose-Capillary: 155 mg/dL — ABNORMAL HIGH (ref 70–99)

## 2024-08-01 NOTE — Progress Notes (Signed)
 IP rehab admissions - I met with patient and his SO [name] at the bedside.  They are interested in CIR prior to DC home. Will await neuro and oncology plans prior to seeking authorization for CIR.  [phone]

## 2024-08-01 NOTE — Plan of Care (Signed)
  Problem: Education: Goal: Knowledge of General Education information will improve Description: Including pain rating scale, medication(s)/side effects and non-pharmacologic comfort measures Outcome: Progressing   Problem: Health Behavior/Discharge Planning: Goal: Ability to manage health-related needs will improve Outcome: Progressing   Problem: Clinical Measurements: Goal: Ability to maintain clinical measurements within normal limits will improve Outcome: Progressing Goal: Will remain free from infection Outcome: Progressing Goal: Diagnostic test results will improve Outcome: Progressing Goal: Respiratory complications will improve Outcome: Progressing Goal: Cardiovascular complication will be avoided Outcome: Progressing   Problem: Activity: Goal: Risk for activity intolerance will decrease Outcome: Progressing   Problem: Nutrition: Goal: Adequate nutrition will be maintained Outcome: Progressing   Problem: Coping: Goal: Level of anxiety will decrease Outcome: Progressing   Problem: Elimination: Goal: Will not experience complications related to bowel motility Outcome: Progressing Goal: Will not experience complications related to urinary retention Outcome: Progressing   Problem: Pain Managment: Goal: General experience of comfort will improve and/or be controlled Outcome: Progressing   Problem: Safety: Goal: Ability to remain free from injury will improve Outcome: Progressing   Problem: Skin Integrity: Goal: Risk for impaired skin integrity will decrease Outcome: Progressing   Problem: Education: Goal: Knowledge of the prescribed therapeutic regimen will improve Outcome: Progressing   Problem: Clinical Measurements: Goal: Usual level of consciousness will be regained or maintained. Outcome: Progressing Goal: Neurologic status will improve Outcome: Progressing Goal: Ability to maintain intracranial pressure will improve Outcome: Progressing    Problem: Skin Integrity: Goal: Demonstration of wound healing without infection will improve Outcome: Progressing   Problem: Education: Goal: Ability to describe self-care measures that may prevent or decrease complications (Diabetes Survival Skills Education) will improve Outcome: Progressing Goal: Individualized Educational Video(s) Outcome: Progressing   Problem: Coping: Goal: Ability to adjust to condition or change in health will improve Outcome: Progressing   Problem: Fluid Volume: Goal: Ability to maintain a balanced intake and output will improve Outcome: Progressing   Problem: Health Behavior/Discharge Planning: Goal: Ability to identify and utilize available resources and services will improve Outcome: Progressing Goal: Ability to manage health-related needs will improve Outcome: Progressing   Problem: Metabolic: Goal: Ability to maintain appropriate glucose levels will improve Outcome: Progressing   Problem: Nutritional: Goal: Maintenance of adequate nutrition will improve Outcome: Progressing Goal: Progress toward achieving an optimal weight will improve Outcome: Progressing   Problem: Skin Integrity: Goal: Risk for impaired skin integrity will decrease Outcome: Progressing   Problem: Tissue Perfusion: Goal: Adequacy of tissue perfusion will improve Outcome: Progressing

## 2024-08-01 NOTE — Progress Notes (Signed)
 [facility]   ROUNDING   NOTE LONNEL GJERDE FMW:[id number]  DOB: 16-Nov-1961  DOA: 07/26/2024  PCP: Patient, No Pcp Per  08/01/2024,12:27 PM  LOS: 6 days    Code Status:  Full     From:  home    63 year old white male-polio as a child Prior inguinal hernia with robotic assisted repair Ad[facility] to [facility] 07/25/2024 with ataxia as well as leg weakness-no falls CT head 4.9 X3 0.7 cm right frontoparietal white matter high-grade CNS lymphoma-MRI brain 4.5 X4.5X 2.9 cm without midline shift-MRI C-spine spinal canal stenosis MR T-spine negative Neurosurgery saw the patient 9/8 right stereotactic brain biopsy, 9/9 transfer out of ICU-repeat head CT = small volume.  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     Subjective:   Looks fair-finished working with therapy Needs a lot of assistance but set up for about 4 hours today    Objective + exam Vitals:   07/31/24 1714 07/31/24 2007 08/01/24 0434 08/01/24 1158  BP: 110/86 115/88 106/73 120/81  Pulse: 95 (!) 106 74 92  Resp: 18 20 18 19   Temp: 98 F (36.7 C) 98.1 F (36.7 C)  98.5 F (36.9 C)  TempSrc: Oral Oral    SpO2: 99% 92% 98% 97%  Weight:      Height:       Filed Weights   07/30/24 1220 07/31/24 0704  Weight: 64.4 kg 64.4 kg     Examination: EOMI NCAT no focal deficit slightly  weaker on the left side than right but I am not sure if this is from his prior polio S1-S2 no murmur Abdomen soft no rebound Chest is clear     Scheduled Meds:  Chlorhexidine  Gluconate Cloth  6 each Topical Daily   dexamethasone   4 mg Oral Q6H   Followed by   dexamethasone   4 mg Oral Q8H   insulin  aspart  0-9 Units Subcutaneous Q4H   pantoprazole   40 mg Oral Daily   Continuous Infusions:  Time 36  Jai-Gurmukh Martavis Gurney, MD  [facility]

## 2024-08-01 NOTE — Evaluation (Signed)
 Occupational Therapy Evaluation Patient Details Name: Joshua Harmon MRN: [id number] DOB: Mar 22, 1961 Today's Date: 08/01/2024   History of Present Illness   63 y.o. male on 9/3, presented to ED at [facility] with complaint of progressive left-sided weakness over the past several weeks to months, leading to falls. Also reported intermittent confusion. CT head showed a 4.9 x 3.7 cm lesion centered within the right frontoparietal white matter suspicious for high-grade primary CNS neoplasm/metastatic. 9/8 stereotactic biopsy. PMH significant for polio as a child, h/o inguinal hernia s/p robotic assisted repair.     Clinical Impressions Pt admitted for above, PTA pt was working and fully ind with Adls/mobility. Pt currently presenting with deficits listed below, has L sided ataxia and requires maxA +2 for gait with cues to maintain upright balance and posture while using RW but his L hand continues to slide off the handle. Pt needing min A to max A for ADLs at this time, he remains a determined individual and has a high tolerance for therapy. Recommend CIR to promote as much functional independence as possible and reduce caregiver burden. OT to continue following pt acutely to progress pt as able.      If plan is discharge home, recommend the following:   A lot of help with walking and/or transfers;A lot of help with bathing/dressing/bathroom;Two people to help with walking and/or transfers;Assistance with cooking/housework;Help with stairs or ramp for entrance;Assist for transportation;Direct supervision/assist for medications management     Functional Status Assessment   Patient has had a recent decline in their functional status and demonstrates the ability to make significant improvements in function in a reasonable and predictable amount of time.     Equipment Recommendations   Other (comment) (defer to next level of care)     Recommendations for Other Services   Rehab consult      Precautions/Restrictions   Precautions Precautions: Fall Recall of Precautions/Restrictions: Impaired Restrictions Weight Bearing Restrictions Per Provider Order: No     Mobility Bed Mobility               General bed mobility comments: in recliner upon arrival    Transfers Overall transfer level: Needs assistance Equipment used: 2 person hand held assist, Rolling walker (2 wheels) Transfers: Sit to/from Stand Sit to Stand: Max assist, +2 physical assistance, Mod assist           General transfer comment: able to stand with both [facility] and with RW. Assist needed to bring L hand to the RW. Cues to come to midline as pt tends to lean and rotate trunk to the right.      Balance Overall balance assessment: Needs assistance Sitting-balance support: Single extremity supported, Feet supported Sitting balance-Leahy Scale: Poor Sitting balance - Comments: CGA-minA Postural control: Right lateral lean Standing balance support: Bilateral upper extremity supported Standing balance-Leahy Scale: Zero Standing balance comment: right lateral lean with right trunk rotation, reliant on MaxAx2                           ADL either performed or assessed with clinical judgement   ADL Overall ADL's : Needs assistance/impaired Eating/Feeding: Minimal assistance;Sitting   Grooming: Sitting;Minimal assistance   Upper Body Bathing: Sitting;Minimal assistance   Lower Body Bathing: Maximal assistance;Sitting/lateral leans   Upper Body Dressing : Moderate assistance;Sitting   Lower Body Dressing: Maximal assistance;Sitting/lateral leans Lower Body Dressing Details (indicate cue type and reason): pt able to don figure four, attempts to  assist in donning shoes but continued to slide forward to edge of seat. Toilet Transfer: Maximal assistance;+2 for physical assistance;+2 for safety/equipment;BSC/3in1   Toileting- Clothing Manipulation and Hygiene: +2 for physical  assistance;Sit to/from stand;Maximal assistance       Functional mobility during ADLs: +2 for physical assistance;Moderate assistance;+2 for safety/equipment;Rolling walker (2 wheels) General ADL Comments: difficulty maniuplating caps, etc.     Vision   Additional Comments: not assessed     Perception         Praxis         Pertinent Vitals/Pain Pain Assessment Pain Assessment: Faces Faces Pain Scale: No hurt     Extremity/Trunk Assessment Upper Extremity Assessment Upper Extremity Assessment: Right hand dominant;LUE deficits/detail;RUE deficits/detail RUE Deficits / Details: Coordination WFL, strength in elbow flex/ext and shoulders WFL LUE Deficits / Details: WFL strength and ROM, able to lift to top of head and maintain resistance against MMT 5/5. impaired coordination with FTN but improving with repetition. LUE Sensation: WNL LUE Coordination: decreased fine motor;decreased gross motor           Communication Communication Communication: No apparent difficulties   Cognition Arousal: Alert Behavior During Therapy: WFL for tasks assessed/performed Cognition: Cognition impaired     Awareness: Intellectual awareness intact, Online awareness impaired     Executive functioning impairment (select all impairments): Problem solving                   Following commands: Impaired Following commands impaired: Follows one step commands with increased time, Follows one step commands inconsistently     Cueing  General Comments   Cueing Techniques: Verbal cues;Visual cues;Tactile cues  Pt fiance present and assistive   Exercises     Shoulder Instructions      Home Living Family/patient expects to be discharged to:: Private residence Living Arrangements: Spouse/significant other Available Help at Discharge: Family (Fiance) Type of Home: House Home Access: Stairs to enter Secretary/administrator of Steps: 3 Entrance Stairs-Rails: None Home Layout: One  level     Bathroom Shower/Tub: Chief Strategy Officer: Standard     Home Equipment: Agricultural consultant (2 wheels)      Lives With: Significant other    Prior Functioning/Environment Prior Level of Function : Independent/Modified Independent;Working/employed;History of Falls (last six months);Driving             Mobility Comments: Reports progressive difficulty in mobility for past 3 months. Previously independent, now falling and using RW. ADLs Comments: Ind until he started to decline about 3 weeks ago. Working as a Museum/gallery exhibitions officer.    OT Problem List: Decreased cognition;Impaired balance (sitting and/or standing);Decreased safety awareness;Decreased coordination;Impaired UE functional use   OT Treatment/Interventions: Therapeutic exercise;Self-care/ADL training;Patient/family education;Balance training;Therapeutic activities;DME and/or AE instruction;Cognitive remediation/compensation      OT Goals(Current goals can be found in the care plan section)   Acute Rehab OT Goals Patient Stated Goal: get better OT Goal Formulation: With patient Time For Goal Achievement: 08/15/24 Potential to Achieve Goals: Fair ADL Goals Pt Will Perform Eating: with set-up;with adaptive utensils;sitting Pt Will Perform Grooming: sitting;with set-up Pt Will Perform Upper Body Dressing: with min assist;sitting Pt Will Perform Lower Body Dressing: with min assist;sit to/from stand Pt Will Transfer to Toilet: bedside commode;stand pivot transfer;with min assist   OT Frequency:  Min 2X/week    Co-evaluation              AM-PAC OT 6 Clicks Daily Activity     Outcome Measure Help  from another person eating meals?: A Little Help from another person taking care of personal grooming?: A Little Help from another person toileting, which includes using toliet, bedpan, or urinal?: A Lot Help from another person bathing (including washing, rinsing, drying)?: A Lot Help from another  person to put on and taking off regular upper body clothing?: A Lot Help from another person to put on and taking off regular lower body clothing?: A Lot 6 Click Score: 14   End of Session Equipment Utilized During Treatment: Gait belt;Rolling walker (2 wheels) Nurse Communication: Mobility status  Activity Tolerance: Patient tolerated treatment well Patient left: in chair;with call bell/phone within reach;with chair alarm set;with family/visitor present  OT Visit Diagnosis: Unsteadiness on feet (R26.81);Other abnormalities of gait and mobility (R26.89);Ataxia, unspecified (R27.0);Other symptoms and signs involving cognitive function                Time: 1011-1043 OT Time Calculation (min): 32 min Charges:  OT General Charges $OT Visit: 1 Visit OT Evaluation $OT Eval Moderate Complexity: 1 Mod  08/01/2024  AB, OTR/L  Acute Rehabilitation Services  Office: [phone]   Curtistine JONETTA Das 08/01/2024, 1:02 PM

## 2024-08-01 NOTE — Progress Notes (Signed)
 Physical Therapy Treatment Patient Details Name: Joshua Harmon MRN: [id number] DOB: 1961-07-25 Today's Date: 08/01/2024   History of Present Illness 63 y.o. male on 9/3, presented to ED at [facility] with complaint of progressive left-sided weakness over the past several weeks to months, leading to falls. Also reported intermittent confusion. CT head showed a 4.9 x 3.7 cm lesion centered within the right frontoparietal white matter suspicious for high-grade primary CNS neoplasm/metastatic. 9/8 stereotactic biopsy. PMH significant for polio as a child, h/o inguinal hernia s/p robotic assisted repair.   PT Comments  Pt in recliner and agreeable to PT session. Worked primarily on gait training in today's session with trial of RW and [facility] approach. MaxAx2 needed to weight-shift and to advance LLE. Increased difficulty controlling placement of LLE with knee hyperextension thrust noted with full weight-bearing. With RW gait trial, pt had difficulty keeping L hand on the RW handle with increased right lateral and right trunk rotation. Able to tolerate two sets of 3ft with a short seated rest break in between. Pt was able to tolerate >30 minutes of activity and is a great candidate for >3hrs post acute rehab. Acute PT to follow.    If plan is discharge home, recommend the following: A lot of help with walking and/or transfers;A little help with bathing/dressing/bathroom;Assistance with cooking/housework;Direct supervision/assist for medications management;Direct supervision/assist for financial management;Assist for transportation;Help with stairs or ramp for entrance;Supervision due to cognitive status   Can travel by private vehicle      No  Equipment Recommendations  Wheelchair (measurements PT);Wheelchair cushion (measurements PT)       Precautions / Restrictions Precautions Precautions: Fall Recall of Precautions/Restrictions: Impaired Restrictions Weight Bearing Restrictions Per Provider Order: No      Mobility  Bed Mobility    General bed mobility comments: in recliner upon arrival    Transfers Overall transfer level: Needs assistance Equipment used: 2 person hand held assist, Rolling walker (2 wheels) Transfers: Sit to/from Stand Sit to Stand: Max assist, +2 physical assistance, Mod assist      General transfer comment: able to stand with both [facility] and with RW. Assist needed to bring L hand to the RW. Cues to come to midline as pt tends to lean and rotate trunk to the right.    Ambulation/Gait Ambulation/Gait assistance: Max assist, +2 physical assistance Gait Distance (Feet): 10 Feet (x2) Assistive device: 2 person hand held assist, Rolling walker (2 wheels) Gait Pattern/deviations: Step-to pattern, Decreased step length - left, Decreased stance time - left, Decreased stride length, Decreased dorsiflexion - left, Decreased weight shift to left, Knee flexed in stance - left, Steppage, Ataxic, Trunk flexed, Drifts right/left, Wide base of support Gait velocity: decr     General Gait Details: trialed gait with 2HH and with RW. Cues to weight-shift with occasional assist needed to advance L LE. Poor control of LLE with hyperextension noted with full weight-bearing. Increased difficulty using RW with pt leaning with trunk rotation to the right, unable to keep L hand on RW handle  Modified Rankin (Stroke Patients Only) Modified Rankin (Stroke Patients Only) Pre-Morbid Rankin Score: No symptoms Modified Rankin: Severe disability     Balance Overall balance assessment: Needs assistance Sitting-balance support: Single extremity supported, Feet supported Sitting balance-Leahy Scale: Poor Sitting balance - Comments: CGA-minA Postural control: Right lateral lean Standing balance support: Bilateral upper extremity supported Standing balance-Leahy Scale: Zero Standing balance comment: right lateral lean with right trunk rotation, reliant on MaxAx2       Communication  Communication Communication: No apparent difficulties  Cognition Arousal: Alert Behavior During Therapy: WFL for tasks assessed/performed   PT - Cognitive impairments: Awareness, Problem solving, Safety/Judgement  PT - Cognition Comments: decreased awareness into deficits. Frequent and repetitive step-by-step cueing for sequencing mobility Following commands: Impaired Following commands impaired: Follows one step commands with increased time, Follows one step commands inconsistently    Cueing Cueing Techniques: Verbal cues, Visual cues, Tactile cues         Pertinent Vitals/Pain Pain Assessment Pain Assessment: No/denies pain     PT Goals (current goals can now be found in the care plan section) Acute Rehab PT Goals PT Goal Formulation: With patient Time For Goal Achievement: 08/10/24 Potential to Achieve Goals: Fair Progress towards PT goals: Progressing toward goals    Frequency    Min 3X/week       AM-PAC PT 6 Clicks Mobility   Outcome Measure  Help needed turning from your back to your side while in a flat bed without using bedrails?: A Little Help needed moving from lying on your back to sitting on the side of a flat bed without using bedrails?: A Little Help needed moving to and from a bed to a chair (including a wheelchair)?: Total Help needed standing up from a chair using your arms (e.g., wheelchair or bedside chair)?: Total Help needed to walk in hospital room?: Total Help needed climbing 3-5 steps with a railing? : Total 6 Click Score: 10    End of Session Equipment Utilized During Treatment: Gait belt Activity Tolerance: Patient tolerated treatment well Patient left: in chair;with call bell/phone within reach;with chair alarm set Nurse Communication: Mobility status PT Visit Diagnosis: Unsteadiness on feet (R26.81);Other abnormalities of gait and mobility (R26.89);Repeated falls (R29.6);History of falling (Z91.81);Ataxic gait (R26.0);Difficulty in  walking, not elsewhere classified (R26.2);Other symptoms and signs involving the nervous system (R29.898);Hemiplegia and hemiparesis Hemiplegia - Right/Left: Left Hemiplegia - dominant/non-dominant: Non-dominant Hemiplegia - caused by:  (brain mass)     Time: 8988-8956 PT Time Calculation (min) (ACUTE ONLY): 32 min  Charges:    $Gait Training: 8-22 mins PT General Charges $$ ACUTE PT VISIT: 1 Visit                    Kate ORN, PT, DPT Secure Chat Preferred  Rehab Office [phone]  08/01/2024, 12:29 PM

## 2024-08-01 NOTE — Evaluation (Signed)
 Speech Language Pathology Evaluation Patient Details Name: Joshua Harmon MRN: [id number] DOB: Apr 10, 1961 Today's Date: 08/01/2024 Time: 8840-8779 SLP Time Calculation (min) (ACUTE ONLY): 21 min  Problem List:  Patient Active Problem List   Diagnosis Date Noted   Brain tumor (HCC) 07/30/2024   Brain mass 07/25/2024   History of hernia repair 07/25/2024   Nicotine  use 07/25/2024   Leg weakness, bilateral 07/25/2024   Past Medical History:  Past Medical History:  Diagnosis Date   GERD (gastroesophageal reflux disease)    Past Surgical History:  Past Surgical History:  Procedure Laterality Date   APPLICATION OF CRANIAL NAVIGATION Right 07/30/2024   Procedure: COMPUTER-ASSISTED NAVIGATION, FOR CRANIAL PROCEDURE;  Surgeon: Rosslyn Dino HERO, MD;  Location: [facility] OR;  Service: Neurosurgery;  Laterality: Right;  CRANIAL NAVIGATION   LEG SURGERY Bilateral as a child   STERIOTACTIC STIMULATOR INSERTION Right 07/30/2024   Procedure: RIGHT STEREOTACTIC BIOPSY;  Surgeon: Rosslyn Dino HERO, MD;  Location: [facility] OR;  Service: Neurosurgery;  Laterality: Right;  RIGHT STERIOTACTIC BRAIN BIOPSY   XI ROBOTIC ASSISTED INGUINAL HERNIA REPAIR WITH MESH Right 02/19/2020   Procedure: XI ROBOTIC ASSISTED Right INGUINAL HERNIA REPAIR WITH MESH;  Surgeon: Jordis Laneta FALCON, MD;  Location: [facility] ORS;  Service: General;  Laterality: Right;   HPI:  63 y.o. male on 9/3, presented to ED at [facility] with complaint of progressive left-sided weakness over the past several weeks to months, leading to falls. Also reported intermittent confusion. CT head showed a 4.9 x 3.7 cm lesion centered within the right frontoparietal white matter suspicious for high-grade primary CNS neoplasm/metastatic. Biopsy 9/8. PMH significant for polio as a child, h/o inguinal hernia s/p robotic assisted repair.   Assessment / Plan / Recommendation Clinical Impression  Pt participatory in speech-language-cognitive assessment. He demonstrated left  inattention, decreased  intellectual and emergent awareness and denied difficulty with recent cognition changes. He did not state difficutly with mobility of left UE but did note his vision is better in right eye. He scored a 14/30 on the SLUMS indicative of significant cognitive impairment primarily in the area of memory in addition to mental calculation and answering questions from paragraph. Pt would benefit from ST on acute and inpatient rehab setting > 3 hours therapy. Brief interaction with fiance arriving at end of session who has noted cognitive changes.    SLP Assessment  SLP Recommendation/Assessment: Patient needs continued Speech Language Pathology Services SLP Visit Diagnosis: Cognitive communication deficit (R41.841)     Assistance Recommended at Discharge  Frequent or constant Supervision/Assistance  Functional Status Assessment Patient has had a recent decline in their functional status and demonstrates the ability to make significant improvements in function in a reasonable and predictable amount of time.  Frequency and Duration min 2x/week  2 weeks      SLP Evaluation Cognition  Overall Cognitive Status: Impaired/Different from baseline Arousal/Alertness: Awake/alert Orientation Level: Oriented to person;Oriented to place;Oriented to time (difficult expressing situation, referred to his brain but could not state specifics) Year: 2025 (initially said 2024 and 2025) Day of Week: Correct Attention: Sustained Sustained Attention: Appears intact Memory: Impaired Memory Impairment: Retrieval deficit (1/4 independent, needed semantic and choice cues) Awareness: Impaired Awareness Impairment: Intellectual impairment;Emergent impairment Problem Solving: Impaired Problem Solving Impairment: Verbal basic;Functional basic Safety/Judgment: Impaired       Comprehension  Auditory Comprehension Overall Auditory Comprehension: Appears within functional limits for tasks  assessed Visual Recognition/Discrimination Discrimination: Not tested Reading Comprehension Reading Status:  (TBA)    Expression Expression Primary  Mode of Expression: Verbal Verbal Expression Overall Verbal Expression: Appears within functional limits for tasks assessed Initiation: No impairment Level of Generative/Spontaneous Verbalization: Conversation Repetition:  (NT) Naming: No impairment Pragmatics: Impairment Impairments: Eye contact;Other (comment) (left inattention) Written Expression Dominant Hand: Right Written Expression: Not tested   Oral / Motor  Oral Motor/Sensory Function Overall Oral Motor/Sensory Function: Within functional limits Motor Speech Overall Motor Speech: Appears within functional limits for tasks assessed Respiration: Within functional limits Phonation: Normal Resonance: Within functional limits Articulation: Within functional limitis Intelligibility: Intelligible Motor Planning: Within functional limits Motor Speech Errors: Not applicable            Joshua Harmon 08/01/2024, 2:29 PM

## 2024-08-02 DIAGNOSIS — G9389 Other specified disorders of brain: Secondary | ICD-10-CM | POA: Diagnosis not present

## 2024-08-02 LAB — GLUCOSE, CAPILLARY
Glucose-Capillary: 104 mg/dL — ABNORMAL HIGH (ref 70–99)
Glucose-Capillary: 112 mg/dL — ABNORMAL HIGH (ref 70–99)
Glucose-Capillary: 131 mg/dL — ABNORMAL HIGH (ref 70–99)
Glucose-Capillary: 149 mg/dL — ABNORMAL HIGH (ref 70–99)
Glucose-Capillary: 155 mg/dL — ABNORMAL HIGH (ref 70–99)
Glucose-Capillary: 162 mg/dL — ABNORMAL HIGH (ref 70–99)
Glucose-Capillary: 189 mg/dL — ABNORMAL HIGH (ref 70–99)

## 2024-08-02 NOTE — Progress Notes (Signed)
 IP rehab admissions - I have called [employer] to request acute inpatient rehab admission.  I will let all know when I hear back from insurance case manager.  [phone]

## 2024-08-02 NOTE — Progress Notes (Signed)
 IP rehab admissions - We have received insurance authorization for admit to CIR for tomorrow 08/03/24.  I will have my partner follow up in am.  [phone]

## 2024-08-02 NOTE — Telephone Encounter (Signed)
 Patient scheduled for follow up with Dr. Janjua.

## 2024-08-02 NOTE — Progress Notes (Signed)
 Physical Therapy Treatment Patient Details Name: Joshua Harmon MRN: [id number] DOB: [birth year] Today's Date: 08/02/2024   History of Present Illness 63 y.o. male on 9/3, presented to ED at [facility] with complaint of progressive left-sided weakness over the past several weeks to months, leading to falls. Also reported intermittent confusion. CT head showed a 4.9 x 3.7 cm lesion centered within the right frontoparietal white matter suspicious for high-grade primary CNS neoplasm/metastatic. 9/8 stereotactic biopsy. PMH significant for polio as a child, h/o inguinal hernia s/p robotic assisted repair.   PT Comments  Pt eager for PT session and making steady progress towards acute PT goals. Pt improved by increasing gait distance with short seated rest breaks in between (x15 ft, x25 ft, x15 ft). Noted improvement with ability to bring L LE forwards during swing phase without physical assist needed to advance. Continues to need ModAx2 to MaxAx2 to stand with [facility] and MaxAx2 via [facility] for gait trials. Pt would repeat phrases during the session and ask the same questions multiple times. Continue to recommend >3hrs post acute rehab with acute PT to follow.     If plan is discharge home, recommend the following: A lot of help with walking and/or transfers;A little help with bathing/dressing/bathroom;Assistance with cooking/housework;Direct supervision/assist for medications management;Direct supervision/assist for financial management;Assist for transportation;Help with stairs or ramp for entrance;Supervision due to cognitive status   Can travel by private vehicle      No  Equipment Recommendations  Wheelchair (measurements PT);Wheelchair cushion (measurements PT)       Precautions / Restrictions Precautions Precautions: Fall Recall of Precautions/Restrictions: Impaired Restrictions Weight Bearing Restrictions Per Provider Order: No     Mobility  Bed Mobility Overal bed mobility: Needs Assistance Bed  Mobility: Supine to Sit    Supine to sit: Min assist, Used rails    General bed mobility comments: MinA for slight steadying assist with trunk raise    Transfers Overall transfer level: Needs assistance Equipment used: 2 person hand held assist Transfers: Sit to/from Stand, Bed to chair/wheelchair/BSC Sit to Stand: Max assist, +2 physical assistance, Mod assist   Step pivot transfers: Max assist, +2 physical assistance, +2 safety/equipment     General transfer comment: cues to bring LE's underneath pt and to push from recliner handle to stand, fluctuates between ModAx2 to MaxAx2 with pt occasionally leaning posteriorly    Ambulation/Gait Ambulation/Gait assistance: Max assist, +2 physical assistance Gait Distance (Feet): 15 Feet (x15, x25, x15) Assistive device: 2 person hand held assist Gait Pattern/deviations: Step-to pattern, Decreased step length - left, Decreased stance time - left, Decreased stride length, Decreased dorsiflexion - left, Decreased weight shift to left, Knee flexed in stance - left, Steppage, Ataxic, Trunk flexed, Narrow base of support Gait velocity: decr    General Gait Details: narrow BOS with LE's scissoring. Poor control of LLE with decreased foot clearance. Cues to heel strike and for increased step length. Used [facility] for support and stability     Modified Rankin (Stroke Patients Only) Modified Rankin (Stroke Patients Only) Pre-Morbid Rankin Score: No symptoms Modified Rankin: Moderately severe disability     Balance Overall balance assessment: Needs assistance Sitting-balance support: Feet supported, Bilateral upper extremity supported Sitting balance-Leahy Scale: Poor Sitting balance - Comments: CGA-minA Postural control: Right lateral lean Standing balance support: Bilateral upper extremity supported, During functional activity, Reliant on assistive device for balance Standing balance-Leahy Scale: Zero Standing balance comment: reliant on  MaxAx2       Communication Communication Communication: No apparent difficulties  Cognition Arousal: Alert Behavior During Therapy: WFL for tasks assessed/performed   PT - Cognitive impairments: Awareness, Problem solving, Safety/Judgement, Memory    PT - Cognition Comments: decreased short term memory with pt often repeating earlier statements or questions Following commands: Impaired Following commands impaired: Follows one step commands with increased time, Follows one step commands inconsistently    Cueing Cueing Techniques: Verbal cues, Visual cues, Tactile cues  Exercises Other Exercises Other Exercises: x4 sit<>stand transitions with B UE support, MaxAx2 to ModAx2        Pertinent Vitals/Pain Pain Assessment Pain Assessment: No/denies pain           PT Goals (current goals can now be found in the care plan section) Acute Rehab PT Goals PT Goal Formulation: With patient Time For Goal Achievement: 08/10/24 Potential to Achieve Goals: Fair Progress towards PT goals: Progressing toward goals    Frequency    Min 3X/week       AM-PAC PT 6 Clicks Mobility   Outcome Measure  Help needed turning from your back to your side while in a flat bed without using bedrails?: A Little Help needed moving from lying on your back to sitting on the side of a flat bed without using bedrails?: A Little Help needed moving to and from a bed to a chair (including a wheelchair)?: Total Help needed standing up from a chair using your arms (e.g., wheelchair or bedside chair)?: Total Help needed to walk in hospital room?: Total Help needed climbing 3-5 steps with a railing? : Total 6 Click Score: 10    End of Session Equipment Utilized During Treatment: Gait belt Activity Tolerance: Patient tolerated treatment well Patient left: in chair;with call bell/phone within reach;with chair alarm set Nurse Communication: Mobility status PT Visit Diagnosis: Unsteadiness on feet  (R26.81);Other abnormalities of gait and mobility (R26.89);Repeated falls (R29.6);History of falling (Z91.81);Ataxic gait (R26.0);Difficulty in walking, not elsewhere classified (R26.2);Other symptoms and signs involving the nervous system (R29.898);Hemiplegia and hemiparesis Hemiplegia - Right/Left: Left Hemiplegia - dominant/non-dominant: Non-dominant 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

## 2024-08-02 NOTE — PMR Pre-admission (Signed)
 PMR Admission Coordinator Pre-Admission Assessment  Patient: Joshua Harmon is an 63 y.o., male MRN: [id number] DOB: July 26, 1961 Height: 6' 1 (185.4 cm) Weight: 67.1 kg  Insurance Information HMO:     PPO: Yes     PCP:       IPA:       80/20:       OTHER:  70/30 PRIMARY: BCBS COMM PPO      Policy#: [id number]      Subscriber: self CM Name: Clotilda Hales    Phone#: [phone]     Fax#: [phone] Pre-Cert#: [id number] from [facility] for admission 08/03/24 to 08/16/24 with updates due on 08/16/24 to fax [phone]      Employer: [employer] Benefits:  Phone #: [phone]   Name: Verified Eff. Date: 11/23/23     Deduct: $2000 (met 1479.19)      Out of Pocket Max: [phone] (met $1982.18      Life Max: n/a CIR: 70%      SNF: 70% Outpatient: limit 30 visits / yr     Co-Pay: $25/visit Home Health: 70%      Co-Pay: 30% DME: 70%     Co-Pay: 30% Providers: in network  SECONDARY:       Policy#:      Phone#:   Artist:       Phone#:   The Data processing manager" for patients in Inpatient Rehabilitation Facilities with attached "Privacy Act Statement-Health Care Records" was provided and verbally reviewed with: Patient  Emergency Contact Information Contact Information     Name Relation Home Work Mobile   [name],[name] Significant other   [phone]      Other Contacts     Name Relation Home Work Mobile   Satre,[name] Sister   [phone]   [name]   [phone]       Current Medical History  Patient Admitting Diagnosis: Brain tumor/High grade glioma  History of Present Illness: A 63 y.o. 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  This has been ongoing for several weeks.  Worse over the last 1 week.  Denies any headache nausea vomiting.  No weight loss.  No shortness of breath  chest pain.  He has been holding onto the furniture in his house to get around.  Has not had any significant falls recently.  He was initially evaluated in [facility] on 07/25/24 where he was found to have brain mass.  Patient was subsequently transferred to [facility] and admitted on 07/26/24 for neurosurgical input.  He underwent a brain biopsy on 07/30/24 by Dr. Rosslyn.  PT/OT/SLP evaluations were completed with recommendations for acute inpatient rehab admission.   Patient's medical record from [facility] has been reviewed by the rehabilitation admission coordinator and physician.  Past Medical History  Past Medical History:  Diagnosis Date   GERD (gastroesophageal reflux disease)     Has the patient had major surgery during 100 days prior to admission? Yes  Family History   Family history is unknown by patient.  Current Medications  Current Facility-Administered Medications:    acetaminophen  (TYLENOL ) tablet 650 mg, 650 mg, Oral, Q6H PRN, 650 mg at 07/30/24 2332 **OR** acetaminophen  (TYLENOL ) suppository 650 mg, 650 mg, Rectal, Q6H PRN, Krishnan, Gokul, MD   butalbital -acetaminophen -caffeine  (FIORICET) 50-325-40 MG per tablet 2 tablet, 2 tablet, Oral, Q4H  PRN, Janjua, Rashid M, MD   [COMPLETED] dexamethasone  (DECADRON ) tablet 4 mg, 4 mg, Oral, Q6H, 4 mg at 08/01/24 1616 **FOLLOWED BY** dexamethasone  (DECADRON ) tablet 4 mg, 4 mg, Oral, Q8H, Kara Dorn NOVAK, MD, 4 mg at 08/03/24 0620   docusate sodium  (COLACE) capsule 100 mg, 100 mg, Oral, BID PRN, Autry, Lauren E, PA-C   insulin  aspart (novoLOG ) injection 0-9 Units, 0-9 Units, Subcutaneous, Q4H, Autry, Lauren E, PA-C, 1 Units at 08/03/24 0420   labetalol  (NORMODYNE ) injection 10-40 mg, 10-40 mg, Intravenous, Q10 min PRN, Janjua, Rashid M, MD   ondansetron  (ZOFRAN ) tablet 4 mg, 4 mg, Oral, Q6H PRN **OR** ondansetron  (ZOFRAN ) injection 4 mg, 4 mg, Intravenous, Q6H PRN, Krishnan, Gokul, MD   pantoprazole   (PROTONIX ) EC tablet 40 mg, 40 mg, Oral, Daily, Dewald, Jonathan B, MD, 40 mg at 08/02/24 2058   polyethylene glycol (MIRALAX  / GLYCOLAX ) packet 17 g, 17 g, Oral, Daily PRN, Adolph Tinnie BRAVO, PA-C, 17 g at 08/01/24 2325  Patients Current Diet:  Diet Order             Diet - low sodium heart healthy           Diet regular Room service appropriate? Yes; Fluid consistency: Thin  Diet effective now                   Precautions / Restrictions Precautions Precautions: Fall Restrictions Weight Bearing Restrictions Per Provider Order: No   Has the patient had 2 or more falls or a fall with injury in the past year? Yes  Prior Activity Level Community (5-7x/wk): Went out daily.  worked as a Museum/gallery exhibitions officer at [employer]  Prior Functional Level Self Care: Did the patient need help bathing, dressing, using the toilet or eating? Independent  Indoor Mobility: Did the patient need assistance with walking from room to room (with or without device)? Independent  Stairs: Did the patient need assistance with internal or external stairs (with or without device)? Independent  Functional Cognition: Did the patient need help planning regular tasks such as shopping or remembering to take medications? Independent  Patient Information Are you of Hispanic, Latino/a,or Spanish origin?: A. No, not of Hispanic, Latino/a, or Spanish origin What is your race?: B. Black or African American Do you need or want an interpreter to communicate with a doctor or health care staff?: 0. No Patient information obtained via proxy : No  Patient's Response To:  Health Literacy and Transportation Is the patient able to respond to health literacy and transportation needs?: Yes Health Literacy - How often do you need to have someone help you when you read instructions, pamphlets, or other written material from your doctor or pharmacy?: Always In the past 12 months, has lack of transportation kept you from medical  appointments or from getting medications?: No In the past 12 months, has lack of transportation kept you from meetings, work, or from getting things needed for daily living?: No Health Literacy and Transportation obtained via proxy: No  Journalist, newspaper / Equipment Home Equipment: Agricultural consultant (2 wheels)  Prior Device Use: Indicate devices/aids used by the patient prior to current illness, exacerbation or injury? None of the above  Current Functional Level Cognition  Arousal/Alertness: Awake/alert Overall Cognitive Status: Impaired/Different from baseline Orientation Level: Oriented X4 Attention: Sustained Sustained Attention: Appears intact Memory: Impaired Memory Impairment: Retrieval deficit (1/4 independent, needed semantic and choice cues) Awareness: Impaired Awareness Impairment: Intellectual impairment, Emergent impairment Problem Solving: Impaired Problem Solving Impairment:  Verbal basic, Functional basic Safety/Judgment: Impaired    Extremity Assessment (includes Sensation/Coordination)  Upper Extremity Assessment: Right hand dominant, LUE deficits/detail, RUE deficits/detail RUE Deficits / Details: Coordination WFL, strength in elbow flex/ext and shoulders WFL LUE Deficits / Details: WFL strength and ROM, able to lift to top of head and maintain resistance against MMT 5/5. impaired coordination with FTN but improving with repetition. LUE Sensation: WNL LUE Coordination: decreased fine motor, decreased gross motor  Lower Extremity Assessment: LLE deficits/detail LLE Deficits / Details: Lt ankle DF 4-/5, Lt ankle eversion 3/5. knee extension 4+/5, knee flexion 4+/5, hip flexion 4/5,  hip abduction 4-/5, hip adduction 5/5. Abnormal HKS test on Lt. 3+ hyperreflexia Lt patellar tendon, absent ankle jerk. Reports normal sensation throughout LLE. LLE Coordination: decreased gross motor    ADLs  Overall ADL's : Needs assistance/impaired Eating/Feeding: Minimal  assistance, Sitting Grooming: Sitting, Minimal assistance Upper Body Bathing: Sitting, Minimal assistance Lower Body Bathing: Maximal assistance, Sitting/lateral leans Upper Body Dressing : Moderate assistance, Sitting Lower Body Dressing: Maximal assistance, Sitting/lateral leans Lower Body Dressing Details (indicate cue type and reason): pt able to don figure four, attempts to assist in donning shoes but continued to slide forward to edge of seat. Toilet Transfer: Maximal assistance, +2 for physical assistance, +2 for safety/equipment, BSC/3in1 Toileting- Clothing Manipulation and Hygiene: +2 for physical assistance, Sit to/from stand, Maximal assistance Functional mobility during ADLs: +2 for physical assistance, Moderate assistance, +2 for safety/equipment, Rolling walker (2 wheels) General ADL Comments: difficulty maniuplating caps, etc.    Mobility  Overal bed mobility: Needs Assistance Bed Mobility: Supine to Sit Supine to sit: Min assist, Used rails General bed mobility comments: MinA for slight steadying assist with trunk raise    Transfers  Overall transfer level: Needs assistance Equipment used: 2 person hand held assist Transfers: Sit to/from Stand, Bed to chair/wheelchair/BSC Sit to Stand: Max assist, +2 physical assistance, Mod assist Bed to/from chair/wheelchair/BSC transfer type:: Step pivot Stand pivot transfers: Max assist Step pivot transfers: Max assist, +2 physical assistance, +2 safety/equipment General transfer comment: cues to bring LE's underneath pt and to push from recliner handle to stand, fluctuates between ModAx2 to MaxAx2 with pt occasionally leaning posteriorly    Ambulation / Gait / Stairs / Wheelchair Mobility  Ambulation/Gait Ambulation/Gait assistance: Max assist, +2 physical assistance Gait Distance (Feet): 15 Feet (x15, x25, x15) Assistive device: 2 person hand held assist Gait Pattern/deviations: Step-to pattern, Decreased step length - left,  Decreased stance time - left, Decreased stride length, Decreased dorsiflexion - left, Decreased weight shift to left, Knee flexed in stance - left, Steppage, Ataxic, Trunk flexed, Narrow base of support General Gait Details: narrow BOS with LE's scissoring. Poor control of LLE with decreased foot clearance. Cues to heel strike and for increased step length. Used 2HH for support and stability Gait velocity: decr Gait velocity interpretation: <1.31 ft/sec, indicative of household ambulator    Posture / Balance Dynamic Sitting Balance Sitting balance - Comments: CGA-minA Balance Overall balance assessment: Needs assistance Sitting-balance support: Feet supported, Bilateral upper extremity supported Sitting balance-Leahy Scale: Poor Sitting balance - Comments: CGA-minA Postural control: Right lateral lean Standing balance support: Bilateral upper extremity supported, During functional activity, Reliant on assistive device for balance Standing balance-Leahy Scale: Zero Standing balance comment: reliant on MaxAx2    Special considerations/life events  Skin Abrasion: leg/bilateral; Surgical Incision: head and Diabetic management Novolog  0-9 units every 4 horus   Previous Home Environment (from acute therapy documentation) Living Arrangements: Spouse/significant other  Lives With: Significant other Available Help at Discharge: Family (Fiance) Type of Home: House Home Layout: One level Home Access: Stairs to enter Entrance Stairs-Rails: None Entrance Stairs-Number of Steps: 3 Bathroom Shower/Tub: Associate Professor: No  Discharge Living Setting Plans for Discharge Living Setting: House, Lives with (comment) (Lives with SO, her daughter and her grand daughter) Type of Home at Discharge: House Discharge Home Layout: One level Discharge Home Access: Stairs to enter Entrance Stairs-Number of Steps: 4 Discharge Bathroom Shower/Tub: Tub/shower  unit, Curtain Discharge Bathroom Toilet: Standard Discharge Bathroom Accessibility: No (Bathroom may be too narrow?)  Social/Family/Support Systems Patient Roles: Partner (Has SO) Contact Information: Boby Cowden - SO [phone] Anticipated Caregiver: [name] - SO Ability/Limitations of Caregiver: Boby is not currently working and can provide supervision and assistance Caregiver Availability: 24/7 Discharge Plan Discussed with Primary Caregiver: Yes Is Caregiver In Agreement with Plan?: Yes Does Caregiver/Family have Issues with Lodging/Transportation while Pt is in Rehab?: No  Goals Patient/Family Goal for Rehab: PT/OT/SLP supervision to min assist goals Expected length of stay: 12-14 days Pt/Family Agrees to Admission and willing to participate: Yes Program Orientation Provided & Reviewed with Pt/Caregiver Including Roles  & Responsibilities: Yes  Decrease burden of Care through IP rehab admission: N/A  Possible need for SNF placement upon discharge: Not planned  Patient Condition: I have reviewed medical records from [facility], spoken with CM, and patient and spouse. I met with patient at the bedside for inpatient rehabilitation assessment.  Patient will benefit from ongoing PT, OT, and SLP, can actively participate in 3 hours of therapy a day 5 days of the week, and can make measurable gains during the admission.  Patient will also benefit from the coordinated team approach during an Inpatient Acute Rehabilitation admission.  The patient will receive intensive therapy as well as Rehabilitation physician, nursing, social worker, and care management interventions.  Due to bladder management, bowel management, safety, skin/wound care, disease management, medication administration, pain management, and patient education the patient requires 24 hour a day rehabilitation nursing.  The patient is currently Mod-Max A +2 with mobility and Min-Max A with basic ADLs.  Discharge  setting and therapy post discharge at home with home health is anticipated.  Patient has agreed to participate in the Acute Inpatient Rehabilitation Program and will admit today.  Preadmission Screen Completed By:  Lovett CHRISTELLA Ropes, 08/03/2024 10:19 AM ______________________________________________________________________   Discussed status with Dr. Babs on 08/03/24  at 10:19 AM and received approval for admission today.  Admission Coordinator:  Tinnie SHAUNNA Yvone Delayne, CCC-SLP, time 10:19 AM/Date 08/03/24    Assessment/Plan: Diagnosis: glioma Does the need for close, 24 hr/day Medical supervision in concert with the patient's rehab needs make it unreasonable for this patient to be served in a less intensive setting? Yes Co-Morbidities requiring supervision/potential complications: post-op sequelae, pain, mood, nutrition Due to bladder management, bowel management, safety, skin/wound care, disease management, medication administration, pain management, and patient education, does the patient require 24 hr/day rehab nursing? Yes Does the patient require coordinated care of a physician, rehab nurse, PT, OT, and SLP to address physical and functional deficits in the context of the above medical diagnosis(es)? Yes Addressing deficits in the following areas: balance, endurance, locomotion, strength, transferring, bowel/bladder control, bathing, dressing, feeding, grooming, toileting, cognition, speech, and psychosocial support Can the patient actively participate in an intensive therapy program of at least 3 hrs of therapy 5 days a week? Yes The potential for patient to  make measurable gains while on inpatient rehab is excellent Anticipated functional outcomes upon discharge from inpatient rehab: supervision and min assist PT, supervision and min assist OT, supervision and min assist SLP Estimated rehab length of stay to reach the above functional goals is: 12-14 days Anticipated discharge  destination: Home 10. Overall Rehab/Functional Prognosis: excellent   MD Signature: Arthea IVAR Gunther, MD, [facility] Physical Medicine & Rehabilitation Medical Director Rehabilitation Services 08/03/2024

## 2024-08-02 NOTE — Progress Notes (Signed)
 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 Januja who has been in contact with oncologist and neuro oncologist Dr. Buckley and patient can follow-up in the outpatient with them Have reached out to CIR to see if patient will qualify for stay He is willing to work towards short-term goals and has a support at home Impaired glucose tolerance Secondary to steroids GERD Continue pantoprazole  40 daily Previous polio    Data Reviewed:   No recent labs  DVT prophylaxis: SCD  Status is: Inpatient Remains inpatient appropriate because:   Requires further management     Current Dispo: Likely CIR?     Subjective:   Looks well feels fair No distress eating and drinking well   Objective + exam Vitals:   08/02/24 0421 08/02/24 0500 08/02/24 0819 08/02/24 1550  BP: (!) 121/98  109/80 106/81  Pulse: 66  (!) 57 89  Resp: 19  16 18   Temp: (!) 97.5 F (36.4 C)  97.6 F (36.4 C) 98.2 F (36.8 C)  TempSrc: Oral  Oral Oral  SpO2: 100%  97% 96%  Weight:  65.9 kg    Height:       Filed  Weights   07/30/24 1220 07/31/24 0704 08/02/24 0500  Weight: 64.4 kg 64.4 kg 65.9 kg     Examination:  EOMI NCAT no focal deficit no icterus Staples to right side of frontoparietal area Extraocular movements intact S1-S2 no murmur Abdomen soft no rebound Noticeably weaker on left side secondary to his prior history of polio as a child Overall power 5/5 Sensory reflexes deferred  Scheduled Meds:  dexamethasone   4 mg Oral Q8H   insulin  aspart  0-9 Units Subcutaneous Q4H   pantoprazole   40 mg Oral Daily   Continuous Infusions:  Time 16  Jai-Gurmukh Tieara Flitton, MD  [facility]

## 2024-08-03 ENCOUNTER — Inpatient Hospital Stay (HOSPITAL_COMMUNITY)
Admission: AD | Admit: 2024-08-03 | Discharge: 2024-08-21 | DRG: 945 | Disposition: A | Discharge status: Home Health | Source: Intra-hospital | Attending: Physical Medicine and Rehabilitation | Admitting: Physical Medicine and Rehabilitation

## 2024-08-03 DIAGNOSIS — K5901 Slow transit constipation: Secondary | ICD-10-CM

## 2024-08-03 DIAGNOSIS — R7302 Impaired glucose tolerance (oral): Secondary | ICD-10-CM | POA: Diagnosis present

## 2024-08-03 DIAGNOSIS — R296 Repeated falls: Secondary | ICD-10-CM | POA: Diagnosis present

## 2024-08-03 DIAGNOSIS — R414 Neurologic neglect syndrome: Secondary | ICD-10-CM | POA: Diagnosis present

## 2024-08-03 DIAGNOSIS — R5381 Other malaise: Secondary | ICD-10-CM | POA: Diagnosis present

## 2024-08-03 DIAGNOSIS — R000 Tachycardia, unspecified: Secondary | ICD-10-CM | POA: Diagnosis not present

## 2024-08-03 DIAGNOSIS — E222 Syndrome of inappropriate secretion of antidiuretic hormone: Secondary | ICD-10-CM | POA: Diagnosis not present

## 2024-08-03 DIAGNOSIS — R270 Ataxia, unspecified: Secondary | ICD-10-CM | POA: Diagnosis present

## 2024-08-03 DIAGNOSIS — D72829 Elevated white blood cell count, unspecified: Secondary | ICD-10-CM | POA: Diagnosis present

## 2024-08-03 DIAGNOSIS — K219 Gastro-esophageal reflux disease without esophagitis: Secondary | ICD-10-CM | POA: Diagnosis present

## 2024-08-03 DIAGNOSIS — D496 Neoplasm of unspecified behavior of brain: Secondary | ICD-10-CM | POA: Diagnosis not present

## 2024-08-03 DIAGNOSIS — Z79899 Other long term (current) drug therapy: Secondary | ICD-10-CM | POA: Diagnosis not present

## 2024-08-03 DIAGNOSIS — F17210 Nicotine dependence, cigarettes, uncomplicated: Secondary | ICD-10-CM | POA: Diagnosis present

## 2024-08-03 DIAGNOSIS — R519 Headache, unspecified: Secondary | ICD-10-CM | POA: Diagnosis not present

## 2024-08-03 DIAGNOSIS — M436 Torticollis: Secondary | ICD-10-CM | POA: Diagnosis present

## 2024-08-03 DIAGNOSIS — R4587 Impulsiveness: Secondary | ICD-10-CM | POA: Diagnosis not present

## 2024-08-03 DIAGNOSIS — K5900 Constipation, unspecified: Secondary | ICD-10-CM | POA: Diagnosis present

## 2024-08-03 DIAGNOSIS — R634 Abnormal weight loss: Secondary | ICD-10-CM | POA: Diagnosis present

## 2024-08-03 DIAGNOSIS — G9389 Other specified disorders of brain: Secondary | ICD-10-CM | POA: Diagnosis not present

## 2024-08-03 DIAGNOSIS — Z8612 Personal history of poliomyelitis: Secondary | ICD-10-CM

## 2024-08-03 DIAGNOSIS — C711 Malignant neoplasm of frontal lobe: Secondary | ICD-10-CM | POA: Diagnosis present

## 2024-08-03 DIAGNOSIS — R252 Cramp and spasm: Secondary | ICD-10-CM | POA: Diagnosis present

## 2024-08-03 DIAGNOSIS — T380X5A Adverse effect of glucocorticoids and synthetic analogues, initial encounter: Secondary | ICD-10-CM | POA: Diagnosis not present

## 2024-08-03 DIAGNOSIS — C719 Malignant neoplasm of brain, unspecified: Secondary | ICD-10-CM | POA: Diagnosis not present

## 2024-08-03 DIAGNOSIS — Z9181 History of falling: Secondary | ICD-10-CM

## 2024-08-03 DIAGNOSIS — Z5320 Procedure and treatment not carried out because of patient's decision for unspecified reasons: Secondary | ICD-10-CM | POA: Diagnosis not present

## 2024-08-03 DIAGNOSIS — Z681 Body mass index (BMI) 19 or less, adult: Secondary | ICD-10-CM

## 2024-08-03 DIAGNOSIS — I10 Essential (primary) hypertension: Secondary | ICD-10-CM | POA: Diagnosis present

## 2024-08-03 DIAGNOSIS — R7401 Elevation of levels of liver transaminase levels: Secondary | ICD-10-CM | POA: Diagnosis present

## 2024-08-03 DIAGNOSIS — R29898 Other symptoms and signs involving the musculoskeletal system: Secondary | ICD-10-CM | POA: Diagnosis present

## 2024-08-03 DIAGNOSIS — Z720 Tobacco use: Secondary | ICD-10-CM | POA: Diagnosis present

## 2024-08-03 DIAGNOSIS — C718 Malignant neoplasm of overlapping sites of brain: Secondary | ICD-10-CM | POA: Diagnosis present

## 2024-08-03 DIAGNOSIS — R531 Weakness: Secondary | ICD-10-CM | POA: Diagnosis present

## 2024-08-03 DIAGNOSIS — Z820 Family history of epilepsy and other diseases of the nervous system: Secondary | ICD-10-CM

## 2024-08-03 LAB — GLUCOSE, CAPILLARY
Glucose-Capillary: 144 mg/dL — ABNORMAL HIGH (ref 70–99)
Glucose-Capillary: 105 mg/dL — ABNORMAL HIGH (ref 70–99)
Glucose-Capillary: 124 mg/dL — ABNORMAL HIGH (ref 70–99)
Glucose-Capillary: 143 mg/dL — ABNORMAL HIGH (ref 70–99)
Glucose-Capillary: 117 mg/dL — ABNORMAL HIGH (ref 70–99)

## 2024-08-03 MED ORDER — SENNOSIDES-DOCUSATE SODIUM 8.6-50 MG PO TABS
1.0000 | ORAL_TABLET | Freq: Every day | ORAL | Status: DC
  Administered 2024-08-03 – 2024-08-16 (×12): 1 via ORAL
  Filled 2024-08-03 (×14): qty 1

## 2024-08-03 MED ORDER — POLYETHYLENE GLYCOL 3350 17 G PO PACK: 17.0000 g | PACK | Freq: Every day | ORAL | Status: DC | PRN

## 2024-08-03 MED ORDER — DEXAMETHASONE 2 MG PO TABS
2.0000 mg | ORAL_TABLET | Freq: Three times a day (TID) | ORAL | Status: DC
  Administered 2024-08-03 – 2024-08-21 (×53): 2 mg via ORAL
  Filled 2024-08-03 (×53): qty 1

## 2024-08-03 MED ORDER — BUTALBITAL-APAP-CAFFEINE 50-325-40 MG PO TABS: 2.0000 | ORAL_TABLET | ORAL | Status: DC | PRN

## 2024-08-03 MED ORDER — ACETAMINOPHEN 325 MG PO TABS
650.0000 mg | ORAL_TABLET | Freq: Four times a day (QID) | ORAL | Status: DC | PRN
  Filled 2024-08-03: qty 2

## 2024-08-03 MED ORDER — ACETAMINOPHEN 650 MG RE SUPP: 650.0000 mg | Freq: Four times a day (QID) | RECTAL | Status: DC | PRN

## 2024-08-03 MED ORDER — PANTOPRAZOLE SODIUM 40 MG PO TBEC
40.0000 mg | DELAYED_RELEASE_TABLET | Freq: Every day | ORAL | Status: DC
  Administered 2024-08-03 – 2024-08-20 (×18): 40 mg via ORAL
  Filled 2024-08-03 (×18): qty 1

## 2024-08-03 MED ORDER — ONDANSETRON HCL 4 MG PO TABS: 4.0000 mg | ORAL_TABLET | Freq: Four times a day (QID) | ORAL | Status: DC | PRN

## 2024-08-03 MED ORDER — DEXAMETHASONE 4 MG PO TABS: 2.0000 mg | ORAL_TABLET | Freq: Three times a day (TID) | ORAL | Status: DC

## 2024-08-03 MED ORDER — ONDANSETRON HCL 4 MG/2ML IJ SOLN: 4.0000 mg | Freq: Four times a day (QID) | INTRAMUSCULAR | Status: DC | PRN

## 2024-08-03 NOTE — Plan of Care (Signed)

## 2024-08-03 NOTE — H&P (Signed)
 Physical Medicine and Rehabilitation Admission H&P    Cc: Functional deficits due to debility                 HPI: Joshua Harmon is a 63 year old male with past medical history of polio as a child, Hx of inguinal hernia s/p post robotic assisted repair who presented to [facility] on 07/[facility]/2024 with symptoms of ataxia and leg weakness.  Per chart review the symptoms have been ongoing for several weeks but had worsened causing him difficulty with ambulation where he was holding onto furniture in his house to get around.  No history of significant falls or LOC. The patient was admitted to [facility] for evaluation and was found to have a brain mass.  A CT of the head revealed a 4.9 x 3.7 centimeter lesion centered within the right frontal parietal white matter, suspicious for a high-grade primary CNS neoplasm or metastatic lesion. A CT of the chest, abdomen, and pelvis showed no evidence of primary malignancy or metastatic disease.  An MRI of the brain revealed a peripheral enhancing mass in the right frontal temporal region. He was transferred to [facility] and admitted on 07/26/2024 for neurosurgical evaluation.  Where he underwent a right stereotactic brain biopsy on 07-30-2024 by Dr. Rosslyn.  A repeat head CT showed a small volume hemorrhage along the biopsy track. Prior to arrival, the patient was independent with self-care ADLs but currently required moderate to maximal assistance with cubicle mobility and minimal to maximal assistance with basic ADLs.     Therapy evaluations completed due to patient decreased functional mobility was admitted for a comprehensive rehab program.          Review of Systems  Constitutional: Negative.   HENT: Negative.    Eyes: Negative.   Respiratory: Negative.    Cardiovascular: Negative.   Gastrointestinal: Negative.   Genitourinary: Negative.   Musculoskeletal: Negative.   Skin: Negative.   Neurological:  Positive for  focal weakness, weakness and headaches.  Psychiatric/Behavioral:  Negative for hallucinations. The patient does not have insomnia.                Past Medical History:  Diagnosis Date   GERD (gastroesophageal reflux disease)               Past Surgical History:  Procedure Laterality Date   APPLICATION OF CRANIAL NAVIGATION Right 07/30/2024    Procedure: COMPUTER-ASSISTED NAVIGATION, FOR CRANIAL PROCEDURE;  Surgeon: Rosslyn Dino HERO, MD;  Location: [facility] OR;  Service: Neurosurgery;  Laterality: Right;  CRANIAL NAVIGATION   LEG SURGERY Bilateral as a child   STERIOTACTIC STIMULATOR INSERTION Right 07/30/2024    Procedure: RIGHT STEREOTACTIC BIOPSY;  Surgeon: Rosslyn Dino HERO, MD;  Location: [facility] OR;  Service: Neurosurgery;  Laterality: Right;  RIGHT STERIOTACTIC BRAIN BIOPSY   XI ROBOTIC ASSISTED INGUINAL HERNIA REPAIR WITH MESH Right 02/19/2020    Procedure: XI ROBOTIC ASSISTED Right INGUINAL HERNIA REPAIR WITH MESH;  Surgeon: Jordis Laneta FALCON, MD;  Location: [facility] ORS;  Service: General;  Laterality: Right;        Family History  Family history unknown: Yes        Social History:  reports that he has been smoking cigarettes. He has never used smokeless tobacco. He reports that he does not currently use alcohol after a past usage of about 10.0 standard drinks of alcohol per week. He reports that he does not currently use drugs. Allergies:  Allergies  No Known  Allergies         Medications Prior to Admission  Medication Sig Dispense Refill   polyethylene glycol (MIRALAX  / GLYCOLAX ) 17 g packet Take 17 g by mouth daily. Mix one tablespoon with 8oz of your favorite juice or water every day until you are having soft formed stools. Then start taking once daily if you didn't have a stool the day before. (Patient not taking: Reported on 02/19/2020) 30 each 0              Home: Home Living Family/patient expects to be discharged to:: Private residence Living Arrangements:  Spouse/significant other Available Help at Discharge: Family (Fiance) Type of Home: House Home Access: Stairs to enter Secretary/administrator of Steps: 3 Entrance Stairs-Rails: None Home Layout: One level Bathroom Shower/Tub: Engineer, manufacturing systems: Standard Bathroom Accessibility: No Home Equipment: Agricultural consultant (2 wheels)  Lives With: Significant other   Functional History: Prior Function Prior Level of Function : Independent/Modified Independent, Working/employed, History of Falls (last six months), Driving Mobility Comments: Reports progressive difficulty in mobility for past 3 months. Previously independent, now falling and using RW. ADLs Comments: Ind until he started to decline about 3 weeks ago. Working as a Museum/gallery exhibitions officer.   Functional Status:  Mobility: Bed Mobility Overal bed mobility: Needs Assistance Bed Mobility: Sit to Supine, Supine to Sit Supine to sit: Min assist, Used rails Sit to supine: Contact guard assist General bed mobility comments: Min A to prevent pt from sliding, Pt with knees in ext while sliding to EOB. Transfers Overall transfer level: Needs assistance Equipment used: 2 person hand held assist Transfers: Sit to/from Stand Sit to Stand: Mod assist, +2 safety/equipment, +2 physical assistance Bed to/from chair/wheelchair/BSC transfer type:: Step pivot Stand pivot transfers: Max assist Step pivot transfers: Max assist, +2 physical assistance, +2 safety/equipment General transfer comment: Cues for pt to hinge at waist and bring BLEs back prior to standing to faciliatate smoother transition into standing. Ambulation/Gait Ambulation/Gait assistance: Max assist, +2 physical assistance Gait Distance (Feet): 35 Feet (x35, x35) Assistive device: 2 person hand held assist Gait Pattern/deviations: Step-to pattern, Decreased step length - left, Decreased stance time - left, Decreased stride length, Decreased dorsiflexion - left, Decreased weight  shift to left, Knee flexed in stance - left, Steppage, Ataxic, Trunk flexed, Narrow base of support General Gait Details: narrow BOS with LE's scissoring. Poor control of LLE with decreased foot clearance. Cues to heel strike and for increased step length. Used 2HH for support and stability Gait velocity: decr Gait velocity interpretation: <1.31 ft/sec, indicative of household ambulator   ADL: ADL Overall ADL's : Needs assistance/impaired Eating/Feeding: Minimal assistance, Sitting Grooming: Sitting, Minimal assistance Upper Body Bathing: Sitting, Minimal assistance Lower Body Bathing: Maximal assistance, Sitting/lateral leans Upper Body Dressing : Moderate assistance, Sitting Lower Body Dressing: Maximal assistance, Sitting/lateral leans Lower Body Dressing Details (indicate cue type and reason): donning bilat shoes. Toilet Transfer: Maximal assistance, +2 for physical assistance, +2 for safety/equipment, BSC/3in1 Toileting- Clothing Manipulation and Hygiene: +2 for physical assistance, Sit to/from stand, Maximal assistance Functional mobility during ADLs: +2 for physical assistance, Moderate assistance, +2 for safety/equipment (bilat HHA) General ADL Comments: difficulty maniuplating caps, etc.   Cognition: Cognition Overall Cognitive Status: Impaired/Different from baseline Arousal/Alertness: Awake/alert Orientation Level: Oriented X4 Year: 2025 (initially said 2024 and 2025) Day of Week: Correct Attention: Sustained Sustained Attention: Appears intact Memory: Impaired Memory Impairment: Retrieval deficit (1/4 independent, needed semantic and choice cues) Awareness: Impaired Awareness Impairment: Intellectual impairment, Emergent impairment  Problem Solving: Impaired Problem Solving Impairment: Verbal basic, Functional basic Safety/Judgment: Impaired Cognition Arousal: Alert Behavior During Therapy: WFL for tasks assessed/performed Overall Cognitive Status: Impaired/Different  from baseline   Physical Exam: Blood pressure 126/87, pulse 90, temperature 98.1 F (36.7 C), temperature source Oral, resp. rate 16, height 6' 1 (1.854 m), weight 67.1 kg, SpO2 96%. Physical Exam Constitutional:      General: He is in acute distress.     Appearance: He is not toxic-appearing.  HENT:     Head: Atraumatic.     Comments: Right fronto-temporal incision CDI with staples    Nose: Nose normal.     Mouth/Throat:     Comments: dentures Eyes:     Extraocular Movements: Extraocular movements intact.     Pupils: Pupils are equal, round, and reactive to light.  Cardiovascular:     Rate and Rhythm: Normal rate and regular rhythm.     Heart sounds: No murmur heard.    No gallop.  Pulmonary:     Effort: Pulmonary effort is normal. No respiratory distress.     Breath sounds: No wheezing.  Abdominal:     General: Bowel sounds are normal. There is no distension.     Palpations: Abdomen is soft.     Tenderness: There is no abdominal tenderness.  Musculoskeletal:        General: No swelling or tenderness. Normal range of motion.     Cervical back: Normal range of motion.  Skin:    General: Skin is warm and dry.  Neurological:     Mental Status: He is alert.     Comments: Pt alert and oriented x 3. Right gaze preference. CN exam non-focal except for mild left central VII. MMT: RUE and RLE grossly 5/5 prox to distal. LUE 4 to 4+ delt, biceps, triceps, wrist and HI. LLE 4/5 HF, KE and 2-3/5 ADF/PF. Pt with inconsistent effort on left side d/t inattention, motor apraxia. Ataxic appearing movements at times on left. Sensation appears grossly intact to LT and pain in all 4's. DTR's 1+. No abnl resting tone.   Psychiatric:        Mood and Affect: Mood normal.        Behavior: Behavior normal.       Lab Results Last 48 Hours        Results for orders placed or performed during the hospital encounter of 07/26/24 (from the past 48 hours)  Comprehensive metabolic panel     Status:  Abnormal    Collection Time: 08/01/24  3:01 PM  Result Value Ref Range    Sodium 135 135 - 145 mmol/L    Potassium 4.1 3.5 - 5.1 mmol/L    Chloride 100 98 - 111 mmol/L    CO2 24 22 - 32 mmol/L    Glucose, Bld 134 (H) 70 - 99 mg/dL      Comment: Glucose reference range applies only to samples taken after fasting for at least 8 hours.    BUN 21 8 - 23 mg/dL    Creatinine, Ser 8.99 0.61 - 1.24 mg/dL    Calcium 9.2 8.9 - 89.6 mg/dL    Total Protein 6.4 (L) 6.5 - 8.1 g/dL    Albumin 3.1 (L) 3.5 - 5.0 g/dL    AST 24 15 - 41 U/L    ALT 16 0 - 44 U/L    Alkaline Phosphatase 53 38 - 126 U/L    Total Bilirubin 0.5 0.0 - 1.2 mg/dL  GFR, Estimated >60 >60 mL/min      Comment: (NOTE) Calculated using the CKD-EPI Creatinine Equation (2021)      Anion gap 11 5 - 15      Comment: Performed at [facility], [address], [place], [place] [address]  CBC with Differential/Platelet     Status: Abnormal    Collection Time: 08/01/24  3:01 PM  Result Value Ref Range    WBC 16.4 (H) 4.0 - 10.5 K/uL    RBC 4.89 4.22 - 5.81 MIL/uL    Hemoglobin 15.3 13.0 - 17.0 g/dL    HCT 55.4 60.9 - 47.9 %    MCV 91.0 80.0 - 100.0 fL    MCH 31.3 26.0 - 34.0 pg    MCHC 34.4 30.0 - 36.0 g/dL    RDW 86.9 88.4 - 84.4 %    Platelets 357 150 - 400 K/uL    nRBC 0.0 0.0 - 0.2 %    Neutrophils Relative % 87 %    Neutro Abs 14.2 (H) 1.7 - 7.7 K/uL    Lymphocytes Relative 8 %    Lymphs Abs 1.3 0.7 - 4.0 K/uL    Monocytes Relative 5 %    Monocytes Absolute 0.8 0.1 - 1.0 K/uL    Eosinophils Relative 0 %    Eosinophils Absolute 0.0 0.0 - 0.5 K/uL    Basophils Relative 0 %    Basophils Absolute 0.0 0.0 - 0.1 K/uL    Immature Granulocytes 0 %    Abs Immature Granulocytes 0.07 0.00 - 0.07 K/uL      Comment: Performed at [facility], [address], [place], [place] [address]  Glucose, capillary     Status: Abnormal    Collection Time: 08/01/24  3:34 PM  Result Value Ref Range    Glucose-Capillary 126 (H)  70 - 99 mg/dL      Comment: Glucose reference range applies only to samples taken after fasting for at least 8 hours.    Comment 1 Notify RN    Glucose, capillary     Status: Abnormal    Collection Time: 08/01/24  8:35 PM  Result Value Ref Range    Glucose-Capillary 155 (H) 70 - 99 mg/dL      Comment: Glucose reference range applies only to samples taken after fasting for at least 8 hours.  Glucose, capillary     Status: Abnormal    Collection Time: 08/01/24 11:59 PM  Result Value Ref Range    Glucose-Capillary 189 (H) 70 - 99 mg/dL      Comment: Glucose reference range applies only to samples taken after fasting for at least 8 hours.  Glucose, capillary     Status: Abnormal    Collection Time: 08/02/24  4:22 AM  Result Value Ref Range    Glucose-Capillary 112 (H) 70 - 99 mg/dL      Comment: Glucose reference range applies only to samples taken after fasting for at least 8 hours.  Glucose, capillary     Status: Abnormal    Collection Time: 08/02/24  8:20 AM  Result Value Ref Range    Glucose-Capillary 104 (H) 70 - 99 mg/dL      Comment: Glucose reference range applies only to samples taken after fasting for at least 8 hours.    Comment 1 Notify RN    Glucose, capillary     Status: Abnormal    Collection Time: 08/02/24 12:02 PM  Result Value Ref Range  Glucose-Capillary 131 (H) 70 - 99 mg/dL      Comment: Glucose reference range applies only to samples taken after fasting for at least 8 hours.    Comment 1 Notify RN    Glucose, capillary     Status: Abnormal    Collection Time: 08/02/24  3:50 PM  Result Value Ref Range    Glucose-Capillary 162 (H) 70 - 99 mg/dL      Comment: Glucose reference range applies only to samples taken after fasting for at least 8 hours.    Comment 1 Notify RN    Glucose, capillary     Status: Abnormal    Collection Time: 08/02/24  7:38 PM  Result Value Ref Range    Glucose-Capillary 155 (H) 70 - 99 mg/dL      Comment: Glucose reference range applies  only to samples taken after fasting for at least 8 hours.    Comment 1 Notify RN      Comment 2 Document in Chart    Glucose, capillary     Status: Abnormal    Collection Time: 08/02/24 11:17 PM  Result Value Ref Range    Glucose-Capillary 149 (H) 70 - 99 mg/dL      Comment: Glucose reference range applies only to samples taken after fasting for at least 8 hours.    Comment 1 Notify RN      Comment 2 Document in Chart    Glucose, capillary     Status: Abnormal    Collection Time: 08/03/24  3:35 AM  Result Value Ref Range    Glucose-Capillary 144 (H) 70 - 99 mg/dL      Comment: Glucose reference range applies only to samples taken after fasting for at least 8 hours.    Comment 1 Notify RN      Comment 2 Document in Chart    Glucose, capillary     Status: Abnormal    Collection Time: 08/03/24  8:21 AM  Result Value Ref Range    Glucose-Capillary 105 (H) 70 - 99 mg/dL      Comment: Glucose reference range applies only to samples taken after fasting for at least 8 hours.    Comment 1 Notify RN      Comment 2 Document in Chart    Glucose, capillary     Status: Abnormal    Collection Time: 08/03/24 12:04 PM  Result Value Ref Range    Glucose-Capillary 124 (H) 70 - 99 mg/dL      Comment: Glucose reference range applies only to samples taken after fasting for at least 8 hours.    Comment 1 Notify RN      Comment 2 Document in Chart        Imaging Results (Last 48 hours)  No results found.         Blood pressure 126/87, pulse 90, temperature 98.1 F (36.7 C), temperature source Oral, resp. rate 16, height 6' 1 (1.854 m), weight 67.1 kg, SpO2 96%.   Medical Problem List and Plan: 1. 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  Antithrombotics: -DVT/anticoagulation:  Mechanical:   Antiembolism stockings, knee (TED hose) Bilateral lower extremities Sequential compression devices, below knee Bilateral lower extremities             -  antiplatelet therapy: N/A 3. Pain Management: Tylenol , Fioricet as needed 4. Mood/Behavior/Sleep: LCSW to follow for evaluation and support when available.              -antipsychotic agents: N/A 5. Neuropsych/cognition: This patient is capable of making decisions on his own behalf. 6. Skin/Wound Care: Routine pressure-relief measures             -Per neurosurgery staple removal planned for next week.  7. Fluids/Electrolytes/Nutrition: Monitor intake and output and routine follow-up labs in a.m.              - SLP consult placed to screen swallow             - GERD: continue PPI 8.  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 Janjua on 9/12 decrease to 2 mg q8h indefinitely until radiation treatment begins 9.  Impaired glucose tolerance: Secondary to steroids CBGs have been below 180 has not required coverage. *Did not order SSI             - CBG monitoring was every 4 hours now 2x daily  10.  Constipation: pt had bm yesterday -senokot-s 1 tab at bedtime -miralax  prn                  Brandi L Leak, NP 08/03/2024  I have personally performed a face to face diagnostic evaluation of this patient and formulated the key components of the plan.  Additionally, I have personally reviewed laboratory data, imaging studies, as well as relevant notes and concur with the physician assistant's documentation above.  The patient's status has not changed from the original H&P.  Any changes in documentation from the acute care chart have been noted above.  Arthea IVAR Gunther, MD, LEELLEN

## 2024-08-03 NOTE — Progress Notes (Signed)
 Occupational Therapy Treatment Patient Details Name: Joshua Harmon MRN: [id number] DOB: 15-Aug-1961 Today's Date: 08/03/2024   History of present illness 63 y.o. male on 9/3, presented to ED at [facility] with complaint of progressive left-sided weakness over the past several weeks to months, leading to falls. Also reported intermittent confusion. CT head showed a 4.9 x 3.7 cm lesion centered within the right frontoparietal white matter suspicious for high-grade primary CNS neoplasm/metastatic. 9/8 stereotactic biopsy. PMH significant for polio as a child, h/o inguinal hernia s/p robotic assisted repair.   OT comments  Pt making progress with mobility, noted to be increasing reciprocal gait pattern offloading his LLE better than prior session. Worked with pt on increasing coordination and controlled AROM of his LUE, reinforcing body position for safety and efficiency PRN. Educated pt on strategies to improve sitting posture from his normal slouched position as well as using visual cues on his phone to correct R head tilt. OT to continue to progress pt as able. DC plans remain appropriate for CIR.       If plan is discharge home, recommend the following:  A lot of help with walking and/or transfers;A lot of help with bathing/dressing/bathroom;Two people to help with walking and/or transfers;Assistance with cooking/housework;Help with stairs or ramp for entrance;Assist for transportation;Direct supervision/assist for medications management   Equipment Recommendations  Other (comment) (defer to next level of care)    Recommendations for Other Services      Precautions / Restrictions Precautions Precautions: Fall Recall of Precautions/Restrictions: Impaired Restrictions Weight Bearing Restrictions Per Provider Order: No       Mobility Bed Mobility Overal bed mobility: Needs Assistance Bed Mobility: Supine to Sit     Supine to sit: Min assist, Used rails     General bed mobility comments:  Min A to prevent pt from sliding, Pt with knees in ext while sliding to EOB.    Transfers Overall transfer level: Needs assistance Equipment used: 2 person hand held assist Transfers: Sit to/from Stand Sit to Stand: Mod assist, +2 safety/equipment, +2 physical assistance           General transfer comment: Cues for pt to hinge at waist and bring BLEs back prior to standing to faciliatate smoother transition into standing.     Balance Overall balance assessment: Needs assistance Sitting-balance support: Feet supported, Bilateral upper extremity supported Sitting balance-Leahy Scale: Poor Sitting balance - Comments: CGA with repositioning assist.   Standing balance support: Bilateral upper extremity supported, During functional activity, Reliant on assistive device for balance Standing balance-Leahy Scale: Poor Standing balance comment: reliant on MaxAx2                           ADL either performed or assessed with clinical judgement   ADL                       Lower Body Dressing: Maximal assistance;Sitting/lateral leans Lower Body Dressing Details (indicate cue type and reason): donning bilat shoes.             Functional mobility during ADLs: +2 for physical assistance;Moderate assistance;+2 for safety/equipment (bilat HHA)      Extremity/Trunk Assessment Upper Extremity Assessment LUE Deficits / Details: WFL strength and ROM, able to lift to top of head and maintain resistance against MMT 5/5. impaired coordination with FTN but improving with repetition. decreased ability to raise over his head and maintain beyond 100 deg shoulder flexion. LUE  Coordination: decreased fine motor;decreased gross motor            Vision   Eye Alignment: Within Functional Limits Ocular Range of Motion: Within Functional Limits Alignment/Gaze Preference: Head tilt (R)   Perception     Praxis     Communication Communication Communication: No apparent  difficulties   Cognition Arousal: Alert Behavior During Therapy: WFL for tasks assessed/performed       Awareness: Online awareness impaired, Intellectual awareness intact     Executive functioning impairment (select all impairments): Problem solving OT - Cognition Comments: Pt continues to have challenges with idenitfying postural inconsistencies.                 Following commands: Impaired Following commands impaired: Follows one step commands with increased time, Follows one step commands inconsistently      Cueing   Cueing Techniques: Verbal cues, Visual cues, Tactile cues  Exercises Other Exercises Other Exercises: Standing at sink, dynamic reaching with BUE support. Cues to maintain at least one UE support while reaching. Other Exercises: Cues and faciliation of anterior pelvic tilt with chest up to promote improved upright posture. Other Exercises: LUE wall slides with circles.    Shoulder Instructions       General Comments Pt fiance present and supportive. discussed using his phone or table top mirror as a visual cue to correct head tilt.    Pertinent Vitals/ Pain       Pain Assessment Pain Assessment: No/denies pain  Home Living                                          Prior Functioning/Environment              Frequency  Min 2X/week        Progress Toward Goals  OT Goals(current goals can now be found in the care plan section)  Progress towards OT goals: Progressing toward goals  Acute Rehab OT Goals Patient Stated Goal: get better OT Goal Formulation: With patient Time For Goal Achievement: 08/15/24 Potential to Achieve Goals: Fair  Plan      Co-evaluation    PT/OT/SLP Co-Evaluation/Treatment: Yes Reason for Co-Treatment: Complexity of the patient's impairments (multi-system involvement);To address functional/ADL transfers;For patient/therapist safety   OT goals addressed during session: ADL's and  self-care;Other (comment) (posture/positioning.)      AM-PAC OT 6 Clicks Daily Activity     Outcome Measure   Help from another person eating meals?: A Little Help from another person taking care of personal grooming?: A Little Help from another person toileting, which includes using toliet, bedpan, or urinal?: A Lot Help from another person bathing (including washing, rinsing, drying)?: A Lot Help from another person to put on and taking off regular upper body clothing?: A Lot Help from another person to put on and taking off regular lower body clothing?: A Lot 6 Click Score: 14    End of Session Equipment Utilized During Treatment: Gait belt  OT Visit Diagnosis: Unsteadiness on feet (R26.81);Other abnormalities of gait and mobility (R26.89);Ataxia, unspecified (R27.0);Other symptoms and signs involving cognitive function   Activity Tolerance Patient tolerated treatment well   Patient Left in bed;with call bell/phone within reach;with bed alarm set;with family/visitor present   Nurse Communication Mobility status        Time: 8962-8886 OT Time Calculation (min): 36 min  Charges: OT General Charges $OT  Visit: 1 Visit OT Treatments $Therapeutic Activity: 8-22 mins  08/03/2024  AB, OTR/L  Acute Rehabilitation Services  Office: [phone]   Curtistine JONETTA Das 08/03/2024, 11:35 AM

## 2024-08-03 NOTE — H&P (Signed)
 Physical Medicine and Rehabilitation Admission H&P   Cc: Functional deficits due to debility    HPI: Joshua Harmon is a 63 year old male with past medical history of polio as a child, Hx of inguinal hernia s/p post robotic assisted repair who presented [facility]24 with symptoms of ataxia and leg weakness.  Per chart review the symptoms have been ongoing for several weeks but had worsened causing him difficulty with ambulation where he was holding onto furniture in his house to get around.  No history of significant falls or LOC. The patient was admitted to [facility] for evaluation and was found to have a brain mass.  A CT of the head revealed a 4.9 x 3.7 centimeter lesion centered within the right frontal parietal white matter, suspicious for a high-grade primary CNS neoplasm or metastatic lesion. A CT of the chest, abdomen, and pelvis showed no evidence of primary malignancy or metastatic disease.  An MRI of the brain revealed a peripheral enhancing mass in the right frontal temporal region. He was transferred to [facility] and admitted on 07/26/2024 for neurosurgical evaluation.  Where he underwent a right stereotactic brain biopsy on 07-30-2024 by Dr. Rosslyn.  A repeat head CT showed a small volume hemorrhage along the biopsy track. Prior to arrival, the patient was independent with self-care ADLs but currently required moderate to maximal assistance with cubicle mobility and minimal to maximal assistance with basic ADLs.   Therapy evaluations completed due to patient decreased functional mobility was admitted for a comprehensive rehab program.      Review of Systems  Constitutional: Negative.   HENT: Negative.    Eyes: Negative.   Respiratory: Negative.    Cardiovascular: Negative.   Gastrointestinal: Negative.   Genitourinary: Negative.   Musculoskeletal: Negative.   Skin: Negative.   Neurological:  Positive for focal weakness,  weakness and headaches.  Psychiatric/Behavioral:  Negative for hallucinations. The patient does not have insomnia.        Past Medical History:  Diagnosis Date   GERD (gastroesophageal reflux disease)    Past Surgical History:  Procedure Laterality Date   APPLICATION OF CRANIAL NAVIGATION Right 07/30/2024   Procedure: COMPUTER-ASSISTED NAVIGATION, FOR CRANIAL PROCEDURE;  Surgeon: Rosslyn Dino HERO, MD;  Location: [facility] OR;  Service: Neurosurgery;  Laterality: Right;  CRANIAL NAVIGATION   LEG SURGERY Bilateral as a child   STERIOTACTIC STIMULATOR INSERTION Right 07/30/2024   Procedure: RIGHT STEREOTACTIC BIOPSY;  Surgeon: Rosslyn Dino HERO, MD;  Location: [facility] OR;  Service: Neurosurgery;  Laterality: Right;  RIGHT STERIOTACTIC BRAIN BIOPSY   XI ROBOTIC ASSISTED INGUINAL HERNIA REPAIR WITH MESH Right 02/19/2020   Procedure: XI ROBOTIC ASSISTED Right INGUINAL HERNIA REPAIR WITH MESH;  Surgeon: Jordis Laneta FALCON, MD;  Location: [facility] ORS;  Service: General;  Laterality: Right;   Family History  Family history unknown: Yes   Social History:  reports that he has been smoking cigarettes. He has never used smokeless tobacco. He reports that he does not currently use alcohol after a past usage of about 10.0 standard drinks of alcohol per week. He reports that he does not currently use drugs. Allergies: No Known Allergies Medications Prior to Admission  Medication Sig Dispense Refill   polyethylene glycol (MIRALAX  / GLYCOLAX ) 17 g packet Take 17 g by mouth daily. Mix one tablespoon with 8oz of your favorite juice or water every day until you are having soft formed stools. Then start taking once daily if you  didn't have a stool the day before. (Patient not taking: Reported on 02/19/2020) 30 each 0      Home: Home Living Family/patient expects to be discharged to:: Private residence Living Arrangements: Spouse/significant other Available Help at Discharge: Family (Fiance) Type of Home: House Home Access:  Stairs to enter Secretary/administrator of Steps: 3 Entrance Stairs-Rails: None Home Layout: One level Bathroom Shower/Tub: Engineer, manufacturing systems: Standard Bathroom Accessibility: No Home Equipment: Agricultural consultant (2 wheels)  Lives With: Significant other   Functional History: Prior Function Prior Level of Function : Independent/Modified Independent, Working/employed, History of Falls (last six months), Driving Mobility Comments: Reports progressive difficulty in mobility for past 3 months. Previously independent, now falling and using RW. ADLs Comments: Ind until he started to decline about 3 weeks ago. Working as a Museum/gallery exhibitions officer.  Functional Status:  Mobility: Bed Mobility Overal bed mobility: Needs Assistance Bed Mobility: Sit to Supine, Supine to Sit Supine to sit: Min assist, Used rails Sit to supine: Contact guard assist General bed mobility comments: Min A to prevent pt from sliding, Pt with knees in ext while sliding to EOB. Transfers Overall transfer level: Needs assistance Equipment used: 2 person hand held assist Transfers: Sit to/from Stand Sit to Stand: Mod assist, +2 safety/equipment, +2 physical assistance Bed to/from chair/wheelchair/BSC transfer type:: Step pivot Stand pivot transfers: Max assist Step pivot transfers: Max assist, +2 physical assistance, +2 safety/equipment General transfer comment: Cues for pt to hinge at waist and bring BLEs back prior to standing to faciliatate smoother transition into standing. Ambulation/Gait Ambulation/Gait assistance: Max assist, +2 physical assistance Gait Distance (Feet): 35 Feet (x35, x35) Assistive device: 2 person hand held assist Gait Pattern/deviations: Step-to pattern, Decreased step length - left, Decreased stance time - left, Decreased stride length, Decreased dorsiflexion - left, Decreased weight shift to left, Knee flexed in stance - left, Steppage, Ataxic, Trunk flexed, Narrow base of  support General Gait Details: narrow BOS with LE's scissoring. Poor control of LLE with decreased foot clearance. Cues to heel strike and for increased step length. Used 2HH for support and stability Gait velocity: decr Gait velocity interpretation: <1.31 ft/sec, indicative of household ambulator    ADL: ADL Overall ADL's : Needs assistance/impaired Eating/Feeding: Minimal assistance, Sitting Grooming: Sitting, Minimal assistance Upper Body Bathing: Sitting, Minimal assistance Lower Body Bathing: Maximal assistance, Sitting/lateral leans Upper Body Dressing : Moderate assistance, Sitting Lower Body Dressing: Maximal assistance, Sitting/lateral leans Lower Body Dressing Details (indicate cue type and reason): donning bilat shoes. Toilet Transfer: Maximal assistance, +2 for physical assistance, +2 for safety/equipment, BSC/3in1 Toileting- Clothing Manipulation and Hygiene: +2 for physical assistance, Sit to/from stand, Maximal assistance Functional mobility during ADLs: +2 for physical assistance, Moderate assistance, +2 for safety/equipment (bilat HHA) General ADL Comments: difficulty maniuplating caps, etc.  Cognition: Cognition Overall Cognitive Status: Impaired/Different from baseline Arousal/Alertness: Awake/alert Orientation Level: Oriented X4 Year: 2025 (initially said 2024 and 2025) Day of Week: Correct Attention: Sustained Sustained Attention: Appears intact Memory: Impaired Memory Impairment: Retrieval deficit (1/4 independent, needed semantic and choice cues) Awareness: Impaired Awareness Impairment: Intellectual impairment, Emergent impairment Problem Solving: Impaired Problem Solving Impairment: Verbal basic, Functional basic Safety/Judgment: Impaired Cognition Arousal: Alert Behavior During Therapy: WFL for tasks assessed/performed Overall Cognitive Status: Impaired/Different from baseline  Physical Exam: Blood pressure 126/87, pulse 90, temperature 98.1 F  (36.7 C), temperature source Oral, resp. rate 16, height 6' 1 (1.854 m), weight 67.1 kg, SpO2 96%. Physical Exam Constitutional:      General: He is in  acute distress.     Appearance: He is not toxic-appearing.  HENT:     Head: Atraumatic.     Comments: Right fronto-temporal incision CDI with staples    Nose: Nose normal.     Mouth/Throat:     Comments: dentures Eyes:     Extraocular Movements: Extraocular movements intact.     Pupils: Pupils are equal, round, and reactive to light.  Cardiovascular:     Rate and Rhythm: Normal rate and regular rhythm.     Heart sounds: No murmur heard.    No gallop.  Pulmonary:     Effort: Pulmonary effort is normal. No respiratory distress.     Breath sounds: No wheezing.  Abdominal:     General: Bowel sounds are normal. There is no distension.     Palpations: Abdomen is soft.     Tenderness: There is no abdominal tenderness.  Musculoskeletal:        General: No swelling or tenderness. Normal range of motion.     Cervical back: Normal range of motion.  Skin:    General: Skin is warm and dry.  Neurological:     Mental Status: He is alert.     Comments: Pt alert and oriented x 3. Right gaze preference. CN exam non-focal except for mild left central VII. MMT: RUE and RLE grossly 5/5 prox to distal. LUE 4 to 4+ delt, biceps, triceps, wrist and HI. LLE 4/5 HF, KE and 2-3/5 ADF/PF. Pt with inconsistent effort on left side d/t inattention, motor apraxia. Ataxic appearing movements at times on left. Sensation appears grossly intact to LT and pain in all 4's. DTR's 1+. No abnl resting tone.   Psychiatric:        Mood and Affect: Mood normal.        Behavior: Behavior normal.     Results for orders placed or performed during the hospital encounter of 07/26/24 (from the past 48 hours)  Comprehensive metabolic panel     Status: Abnormal   Collection Time: 08/01/24  3:01 PM  Result Value Ref Range   Sodium 135 135 - 145 mmol/L   Potassium 4.1 3.5 -  5.1 mmol/L   Chloride 100 98 - 111 mmol/L   CO2 24 22 - 32 mmol/L   Glucose, Bld 134 (H) 70 - 99 mg/dL    Comment: Glucose reference range applies only to samples taken after fasting for at least 8 hours.   BUN 21 8 - 23 mg/dL   Creatinine, Ser 8.99 0.61 - 1.24 mg/dL   Calcium 9.2 8.9 - 89.6 mg/dL   Total Protein 6.4 (L) 6.5 - 8.1 g/dL   Albumin 3.1 (L) 3.5 - 5.0 g/dL   AST 24 15 - 41 U/L   ALT 16 0 - 44 U/L   Alkaline Phosphatase 53 38 - 126 U/L   Total Bilirubin 0.5 0.0 - 1.2 mg/dL   GFR, Estimated >39 >39 mL/min    Comment: (NOTE) Calculated using the CKD-EPI Creatinine Equation (2021)    Anion gap 11 5 - 15    Comment: Performed at [facility], [address], [place], [place] [address]  CBC with Differential/Platelet     Status: Abnormal   Collection Time: 08/01/24  3:01 PM  Result Value Ref Range   WBC 16.4 (H) 4.0 - 10.5 K/uL   RBC 4.89 4.22 - 5.81 MIL/uL   Hemoglobin 15.3 13.0 - 17.0 g/dL   HCT 55.4 60.9 - 47.9 %   MCV  91.0 80.0 - 100.0 fL   MCH 31.3 26.0 - 34.0 pg   MCHC 34.4 30.0 - 36.0 g/dL   RDW 86.9 88.4 - 84.4 %   Platelets 357 150 - 400 K/uL   nRBC 0.0 0.0 - 0.2 %   Neutrophils Relative % 87 %   Neutro Abs 14.2 (H) 1.7 - 7.7 K/uL   Lymphocytes Relative 8 %   Lymphs Abs 1.3 0.7 - 4.0 K/uL   Monocytes Relative 5 %   Monocytes Absolute 0.8 0.1 - 1.0 K/uL   Eosinophils Relative 0 %   Eosinophils Absolute 0.0 0.0 - 0.5 K/uL   Basophils Relative 0 %   Basophils Absolute 0.0 0.0 - 0.1 K/uL   Immature Granulocytes 0 %   Abs Immature Granulocytes 0.07 0.00 - 0.07 K/uL    Comment: Performed at [facility], [address], [place], [place] [address]  Glucose, capillary     Status: Abnormal   Collection Time: 08/01/24  3:34 PM  Result Value Ref Range   Glucose-Capillary 126 (H) 70 - 99 mg/dL    Comment: Glucose reference range applies only to samples taken after fasting for at least 8 hours.   Comment 1 Notify RN   Glucose, capillary     Status:  Abnormal   Collection Time: 08/01/24  8:35 PM  Result Value Ref Range   Glucose-Capillary 155 (H) 70 - 99 mg/dL    Comment: Glucose reference range applies only to samples taken after fasting for at least 8 hours.  Glucose, capillary     Status: Abnormal   Collection Time: 08/01/24 11:59 PM  Result Value Ref Range   Glucose-Capillary 189 (H) 70 - 99 mg/dL    Comment: Glucose reference range applies only to samples taken after fasting for at least 8 hours.  Glucose, capillary     Status: Abnormal   Collection Time: 08/02/24  4:22 AM  Result Value Ref Range   Glucose-Capillary 112 (H) 70 - 99 mg/dL    Comment: Glucose reference range applies only to samples taken after fasting for at least 8 hours.  Glucose, capillary     Status: Abnormal   Collection Time: 08/02/24  8:20 AM  Result Value Ref Range   Glucose-Capillary 104 (H) 70 - 99 mg/dL    Comment: Glucose reference range applies only to samples taken after fasting for at least 8 hours.   Comment 1 Notify RN   Glucose, capillary     Status: Abnormal   Collection Time: 08/02/24 12:02 PM  Result Value Ref Range   Glucose-Capillary 131 (H) 70 - 99 mg/dL    Comment: Glucose reference range applies only to samples taken after fasting for at least 8 hours.   Comment 1 Notify RN   Glucose, capillary     Status: Abnormal   Collection Time: 08/02/24  3:50 PM  Result Value Ref Range   Glucose-Capillary 162 (H) 70 - 99 mg/dL    Comment: Glucose reference range applies only to samples taken after fasting for at least 8 hours.   Comment 1 Notify RN   Glucose, capillary     Status: Abnormal   Collection Time: 08/02/24  7:38 PM  Result Value Ref Range   Glucose-Capillary 155 (H) 70 - 99 mg/dL    Comment: Glucose reference range applies only to samples taken after fasting for at least 8 hours.   Comment 1 Notify RN    Comment 2 Document in Chart   Glucose, capillary  Status: Abnormal   Collection Time: 08/02/24 11:17 PM  Result Value  Ref Range   Glucose-Capillary 149 (H) 70 - 99 mg/dL    Comment: Glucose reference range applies only to samples taken after fasting for at least 8 hours.   Comment 1 Notify RN    Comment 2 Document in Chart   Glucose, capillary     Status: Abnormal   Collection Time: 08/03/24  3:35 AM  Result Value Ref Range   Glucose-Capillary 144 (H) 70 - 99 mg/dL    Comment: Glucose reference range applies only to samples taken after fasting for at least 8 hours.   Comment 1 Notify RN    Comment 2 Document in Chart   Glucose, capillary     Status: Abnormal   Collection Time: 08/03/24  8:21 AM  Result Value Ref Range   Glucose-Capillary 105 (H) 70 - 99 mg/dL    Comment: Glucose reference range applies only to samples taken after fasting for at least 8 hours.   Comment 1 Notify RN    Comment 2 Document in Chart   Glucose, capillary     Status: Abnormal   Collection Time: 08/03/24 12:04 PM  Result Value Ref Range   Glucose-Capillary 124 (H) 70 - 99 mg/dL    Comment: Glucose reference range applies only to samples taken after fasting for at least 8 hours.   Comment 1 Notify RN    Comment 2 Document in Chart    No results found.    Blood pressure 126/87, pulse 90, temperature 98.1 F (36.7 C), temperature source Oral, resp. rate 16, height 6' 1 (1.854 m), weight 67.1 kg, SpO2 96%.  Medical Problem List and Plan: 1. 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  Antithrombotics: -DVT/anticoagulation:  Mechanical:  Antiembolism stockings, knee (TED hose) Bilateral lower extremities Sequential compression devices, below knee Bilateral lower extremities  -antiplatelet therapy: N/A 3. Pain Management: Tylenol , Fioricet as needed 4. Mood/Behavior/Sleep: LCSW to follow for evaluation and support when available.   -antipsychotic  agents: N/A 5. Neuropsych/cognition: This patient is capable of making decisions on his own behalf. 6. Skin/Wound Care: Routine pressure-relief measures  -Per neurosurgery staple removal planned for next week.  7. Fluids/Electrolytes/Nutrition: Monitor intake and output and routine follow-up labs in a.m.   - SLP consult placed to screen swallow  - GERD: continue PPI 8.  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 Janjua on 9/12 decrease to 2 mg q8h indefinitely until radiation treatment begins 9.  Impaired glucose tolerance: Secondary to steroids CBGs have been below 180 has not required coverage. *Did not order SSI  - CBG monitoring was every 4 hours now 2x daily  10.  Constipation: pt had bm yesterday -senokot-s 1 tab at bedtime -miralax  prn         Daphne LITTIE Finders, NP 08/03/2024

## 2024-08-03 NOTE — Progress Notes (Signed)
 Physical Therapy Treatment Patient Details Name: Joshua Harmon MRN: [id number] DOB: 1961/02/10 Today's Date: 08/03/2024   History of Present Illness 63 y.o. male on 9/3, presented to ED at [facility] with complaint of progressive left-sided weakness over the past several weeks to months, leading to falls. Also reported intermittent confusion. CT head showed a 4.9 x 3.7 cm lesion centered within the right frontoparietal white matter suspicious for high-grade primary CNS neoplasm/metastatic. 9/8 stereotactic biopsy. PMH significant for polio as a child, h/o inguinal hernia s/p robotic assisted repair.    PT Comments  Pt eager for mobility and making steady progress towards acute PT goals. Pt improved by needing less assistance, ModAx2, to stand with [facility]. Improvements also noted with increased gait speed and ability to control placement of L LE. Pt will occasionally have difficulty with L LE coordination with standing rest breaks to re-set. Continue to recommend >3hrs post acute rehab with acute PT to follow.    If plan is discharge home, recommend the following: A lot of help with walking and/or transfers;A little help with bathing/dressing/bathroom;Assistance with cooking/housework;Direct supervision/assist for medications management;Direct supervision/assist for financial management;Assist for transportation;Help with stairs or ramp for entrance;Supervision due to cognitive status   Can travel by private vehicle      No  Equipment Recommendations  Wheelchair (measurements PT);Wheelchair cushion (measurements PT)       Precautions / Restrictions Precautions Precautions: Fall Recall of Precautions/Restrictions: Impaired Restrictions Weight Bearing Restrictions Per Provider Order: No     Mobility  Bed Mobility Overal bed mobility: Needs Assistance Bed Mobility: Sit to Supine, Supine to Sit    Supine to sit: Min assist, Used rails Sit to supine: Contact guard assist   General bed mobility  comments: Min A to prevent pt from sliding, Pt with knees in ext while sliding to EOB.    Transfers Overall transfer level: Needs assistance Equipment used: 2 person hand held assist Transfers: Sit to/from Stand Sit to Stand: Mod assist, +2 safety/equipment, +2 physical assistance    General transfer comment: Cues for pt to hinge at waist and bring BLEs back prior to standing to faciliatate smoother transition into standing.    Ambulation/Gait Ambulation/Gait assistance: Max assist, +2 physical assistance Gait Distance (Feet): 35 Feet (x35, x35) Assistive device: 2 person hand held assist Gait Pattern/deviations: Step-to pattern, Decreased step length - left, Decreased stance time - left, Decreased stride length, Decreased dorsiflexion - left, Decreased weight shift to left, Knee flexed in stance - left, Steppage, Ataxic, Trunk flexed, Narrow base of support Gait velocity: decr     General Gait Details: narrow BOS with LE's scissoring. Poor control of LLE with decreased foot clearance. Cues to heel strike and for increased step length. Used [facility] for support and stability     Balance Overall balance assessment: Needs assistance Sitting-balance support: Feet supported, Bilateral upper extremity supported Sitting balance-Leahy Scale: Poor Sitting balance - Comments: CGA with repositioning assist.   Standing balance support: Bilateral upper extremity supported, During functional activity, Reliant on assistive device for balance Standing balance-Leahy Scale: Poor Standing balance comment: reliant on MaxAx2       Communication Communication Communication: No apparent difficulties  Cognition Arousal: Alert Behavior During Therapy: WFL for tasks assessed/performed   PT - Cognitive impairments: Awareness, Problem solving, Safety/Judgement, Memory      Following commands: Impaired Following commands impaired: Follows one step commands with increased time, Follows one step commands  inconsistently    Cueing Cueing Techniques: Verbal cues, Visual cues, Tactile  cues  Exercises Other Exercises Other Exercises: Bx8 anterior steps to target Other Exercises: x5 shoulder retraction and neck extension    General Comments General comments (skin integrity, edema, etc.): Pt fiance present and supportive. discussed using his phone or table top mirror as a visual cue to correct head tilt.      Pertinent Vitals/Pain Pain Assessment Pain Assessment: No/denies pain     PT Goals (current goals can now be found in the care plan section) Acute Rehab PT Goals PT Goal Formulation: With patient Time For Goal Achievement: 08/10/24 Potential to Achieve Goals: Fair Progress towards PT goals: Progressing toward goals    Frequency    Min 3X/week           Co-evaluation PT/OT/SLP Co-Evaluation/Treatment: Yes Reason for Co-Treatment: Complexity of the patient's impairments (multi-system involvement);To address functional/ADL transfers;For patient/therapist safety PT goals addressed during session: Mobility/safety with mobility;Proper use of DME;Balance OT goals addressed during session: ADL's and self-care;Other (comment) (posture/positioning.)      AM-PAC PT 6 Clicks Mobility   Outcome Measure  Help needed turning from your back to your side while in a flat bed without using bedrails?: A Little Help needed moving from lying on your back to sitting on the side of a flat bed without using bedrails?: A Little Help needed moving to and from a bed to a chair (including a wheelchair)?: Total Help needed standing up from a chair using your arms (e.g., wheelchair or bedside chair)?: Total Help needed to walk in hospital room?: Total Help needed climbing 3-5 steps with a railing? : Total 6 Click Score: 10    End of Session Equipment Utilized During Treatment: Gait belt Activity Tolerance: Patient tolerated treatment well Patient left: in bed;with call bell/phone within  reach;with family/visitor present Nurse Communication: Mobility status PT Visit Diagnosis: Unsteadiness on feet (R26.81);Other abnormalities of gait and mobility (R26.89);Repeated falls (R29.6);History of falling (Z91.81);Ataxic gait (R26.0);Difficulty in walking, not elsewhere classified (R26.2);Other symptoms and signs involving the nervous system (R29.898);Hemiplegia and hemiparesis Hemiplegia - Right/Left: Left Hemiplegia - dominant/non-dominant: Non-dominant 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

## 2024-08-03 NOTE — Progress Notes (Signed)
 Inpatient Rehab Admissions Coordinator:  Received insurance approval. There is a bed available for pt in CIR today. Dr. Royal is aware and in agreement. Pt, pt's sister [name], NSG and TOC made aware.   [name], MS, CCC-SLP Admissions Coordinator [phone]

## 2024-08-03 NOTE — TOC Transition Note (Signed)
 Transition of Care [facility]) - Discharge Note   Patient Details  Name: Joshua Harmon MRN: [id number] Date of Birth: 1961/04/17  [facility] CM/SW Contact:  Almarie CHRISTELLA Goodie, LCSW Phone Number: 08/03/2024, 10:29 AM   Clinical Narrative:   Patient discharging to CIR today, no further inpatient care management needs.    Final next level of care: IP Rehab Facility Barriers to Discharge: Barriers Resolved   Patient Goals and CMS Choice            Discharge Placement                       Discharge Plan and Services Additional resources added to the After Visit Summary for                                       Social Drivers of Health (SDOH) Interventions SDOH Screenings   Food Insecurity: No Food Insecurity (07/27/2024)  Recent Concern: Food Insecurity - Food Insecurity Present (05/07/2024)   Received from [facility]  Housing: Low Risk  (07/27/2024)  Recent Concern: Housing - High Risk (05/07/2024)   Received from [facility]  Transportation Needs: No Transportation Needs (07/27/2024)  Recent Concern: Transportation Needs - Unmet Transportation Needs (05/07/2024)   Received from [facility]  Utilities: Not At Risk (07/27/2024)  Recent Concern: Utilities - At Risk (05/07/2024)   Received from [facility]  Financial Resource Strain: Medium Risk (05/07/2024)   Received from [facility]  Social Connections: Unknown (07/25/2024)  Tobacco Use: High Risk (07/30/2024)     Readmission Risk Interventions     No data to display

## 2024-08-03 NOTE — Discharge Summary (Signed)
 Physician Discharge Summary  Joshua Harmon FMW:[id number] DOB: 1961/04/13 DOA: 07/26/2024  PCP: Patient, No Pcp Per  Admit date: 07/26/2024 Discharge date: 08/03/2024  Time spent: 30 minutes  Recommendations for Outpatient Follow-up:  Close outpatient care coordination with Dr. Miguel, Dr. Lauraine Radon of radiation oncology as well as Dr. Buckley neuro-oncology-appointments to be set up closer to the time of discharge from CIR Watch sugars periodically and if above 180 consider metformin as will need indefinite low-dose Decadron  at discharge Scalp sutures/staples to be removed by neurosurgery on around 9/19-neurosurgeon is aware  Discharge Diagnoses:  MAIN problem for hospitalization   WHO stage IV glioma status postresection of  Please see below for itemized issues addressed in HOpsital- refer to other progress notes for clarity if needed  Discharge Condition: Fair    63 year old male-polio as a child Prior inguinal hernia with robotic assisted repair Admitted to [facility] 07/25/2024 with ataxia as well as leg weakness-no falls CT head 4.9 X3 0.7 cm right frontoparietal white matter high-grade CNS lymphoma-MRI brain 4.5 X4.5X 2.9 cm without midline shift-MRI C-spine spinal canal stenosis MR T-spine negative Neurosurgery saw the patient 9/8 right stereotactic brain biopsy, 9/9 transfer out of ICU-repeat head CT = small volume.  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 Lindajean appointments Dr. Lauraine Radon Dr Daryll to be set up For staple removal as per neurosurgery as above Impaired glucose tolerance Secondary to steroids-all sugars have been below 180-does not require coverage at this time GERD Continue pantoprazole  40  daily Previous polio  Diet recommendation: Regular  Filed Weights   07/31/24 0704 08/02/24 0500 08/03/24 0500  Weight: 64.4 kg 65.9 kg 67.1 kg    Discharge Exam: Vitals:   08/03/24 0700 08/03/24 0819  BP:  (!) 121/91  Pulse:  67  Resp: 14 14  Temp:  (!) 97.5 F (36.4 C)  SpO2:  97%    Subj on day of d/c   Awake alert coherent no distress Feels well moving all 4 limbs  General Exam on discharge  EOMI NCAT Staples on the right frontoparietal region Neck soft supple Chest clear S1-S2 no murmur ROM intact Abdomen soft no rebound or guarding.  Discharge Instructions   Discharge Instructions     Diet - low sodium heart healthy   Complete by: As directed    Discharge wound care:   Complete by: As directed    Please contact neurosurgeon Dr. Rashid Janu.ja on 9/19 for scalp staple removal   Increase activity slowly   Complete by: As directed    No wound care   Complete by: As directed       Allergies as of 08/03/2024   No Known Allergies      Medication List     TAKE these medications    dexamethasone  4 MG tablet Commonly known as: DECADRON  Take 0.5 tablets (2 mg total) by mouth every 8 (eight) hours.   polyethylene glycol 17 g packet Commonly known as: MIRALAX  / GLYCOLAX  Take 17 g by mouth daily. Mix one tablespoon with 8oz of your favorite juice or water every day until you are having soft formed stools. Then start taking once daily if you didn't have a stool the day before.  Discharge Care Instructions  (From admission, onward)           Start     Ordered   08/03/24 0000  Discharge wound care:       Comments: Please contact neurosurgeon Dr. Rashid Janu.ja on 9/19 for scalp staple removal   08/03/24 0945           No Known Allergies    The results of significant diagnostics from this hospitalization (including imaging, microbiology, ancillary and laboratory) are listed below for reference.    Significant  Diagnostic Studies: CT HEAD WO CONTRAST Result Date: 07/31/2024 CLINICAL DATA:  Initial postoperative evaluation. EXAM: CT HEAD WITHOUT CONTRAST TECHNIQUE: Contiguous axial images were obtained from the base of the skull through the vertex without intravenous contrast. RADIATION DOSE REDUCTION: This exam was performed according to the departmental dose-optimization program which includes automated exposure control, adjustment of the mA and/or kV according to patient size and/or use of iterative reconstruction technique. COMPARISON:  Prior MRI from 07/25/2024. FINDINGS: Brain: Postoperative changes from interval right frontal burr hole craniotomy for presumed biopsy of previously identified right cerebral mass. Small amount of postoperative pneumocephalus overlies the right cerebral convexity. Small volume hemorrhage seen along the biopsy tract at the level of the right frontal operculum. Hemorrhage within the biopsy cavity/mass itself measures 1.8 x 1.4 x 1.8 cm (series 2, image 20). The underlying mass itself is otherwise grossly stable. Associated regional mass effect with trace right-to-left shift at the septum pellucidum, not significantly changed. No visible intraventricular hemorrhage. No other acute intracranial hemorrhage. No acute large vessel territory infarct. No other mass lesion or mass effect. No hydrocephalus or extra-axial fluid collection. Vascular: No abnormal hyperdense vessel. Scattered calcified atherosclerosis present at the skull base. Skull: Post craniotomy changes at the right frontal scalp. Skin staples in place. No adverse features. Sinuses/Orbits: Globes orbital soft tissues demonstrate no acute finding. Remote posttraumatic defect noted at the right lamina papyracea. Paranasal sinuses are largely clear. No mastoid effusion. Other: None. IMPRESSION: 1. Postoperative changes from interval right frontal burr hole craniotomy for biopsy of patient's known right cerebral mass. Small volume  postoperative hemorrhage along the biopsy tract and within the resection cavity itself as detailed above. No other complicating features. 2. No other new acute intracranial abnormality. Electronically Signed   By: Morene Hoard M.D.   On: 07/31/2024 02:01   CT CHEST ABDOMEN PELVIS W CONTRAST Result Date: 07/25/2024 CLINICAL DATA:  Brain mass, assess for metastatic disease. EXAM: CT CHEST, ABDOMEN, AND PELVIS WITH CONTRAST TECHNIQUE: Multidetector CT imaging of the chest, abdomen and pelvis was performed following the standard protocol during bolus administration of intravenous contrast. RADIATION DOSE REDUCTION: This exam was performed according to the departmental dose-optimization program which includes automated exposure control, adjustment of the mA and/or kV according to patient size and/or use of iterative reconstruction technique. CONTRAST:  OMNIPAQUE  IOHEXOL  300 MG/ML  SOLN COMPARISON:  Abdominopelvic CT 01/29/2020 FINDINGS: CT CHEST FINDINGS Cardiovascular: Normal heart size. No pericardial effusion. Minimal aortic atherosclerosis without aneurysm. Mediastinum/Nodes: No mediastinal or hilar adenopathy. Small hiatal hernia. No esophageal wall thickening. No suspicious thyroid nodule. Lungs/Pleura: Minimal emphysema. No pulmonary nodule or mass. Mild dependent atelectasis. No pleural effusion or thickening. The trachea and central airways are clear. Musculoskeletal: No evidence of lytic or blastic osseous lesion. Upper thoracic scoliosis. No chest wall soft tissue abnormalities. CT ABDOMEN PELVIS FINDINGS Hepatobiliary: No focal liver abnormality is seen. No gallstones, gallbladder wall thickening, or biliary dilatation. Pancreas: No evidence of  pancreatic mass. No ductal dilatation or inflammation. Spleen: Normal in size without focal abnormality. Adrenals/Urinary Tract: No adrenal nodule. No renal mass or renal calculi. No hydronephrosis. No bladder wall thickening or evidence of bladder  mass. Stomach/Bowel: Small hiatal hernia. There is no gastric wall thickening. No evidence of small bowel mass, obstruction or inflammation. Moderate volume of stool in the colon. No obvious colonic mass. Distal descending and sigmoid colonic diverticulosis without diverticulitis. Vascular/Lymphatic: Normal caliber abdominal aorta with mild atherosclerosis. Portal vein is patent. No suspicious lymphadenopathy. Reproductive: Prostate is unremarkable. Other: Right inguinal hernia repair. No ascites. No omental thickening. No subcutaneous lesion. Musculoskeletal: Peripherally sclerotic lucency within the left iliac bone is unchanged from 2021 and considered benign. No suspicious bone lesion. Multilevel degenerative change in the spine. 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 IMPRESSION: 1. No evidence of primary malignancy or metastatic disease in the chest, abdomen, or pelvis. 2. Small hiatal hernia. Colonic diverticulosis without diverticulitis. 3. 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 Aortic Atherosclerosis (ICD10-I70.0) and Emphysema (ICD10-J43.9). Electronically Signed   By: Andrea Gasman M.D.   On: 07/25/2024 23:08   MR Cervical Spine W and Wo Contrast Result Date: 07/25/2024 CLINICAL DATA:  Provided history: Ataxia, nontraumatic, cervical pathology suspected. EXAM: MRI CERVICAL SPINE WITHOUT AND WITH CONTRAST TECHNIQUE: Multiplanar and multiecho pulse sequences of the cervical spine, to include the craniocervical junction and cervicothoracic junction, were obtained without and with intravenous contrast. CONTRAST:  6mL GADAVIST  GADOBUTROL  1 MMOL/ML IV SOLN COMPARISON:  None. FINDINGS: Alignment: Levocurvature of the cervical spine. Nonspecific straightening of the expected cervical lordosis. 4 mm C3-C4 grade 1 anterolisthesis. Slight C4-C5 grade 1 anterolisthesis. 5 mm C7-T1 grade 1 anterolisthesis. Vertebrae: Multilevel degenerative endplate irregularity. Mild  degenerative endplate edema at R5-R4, C5-C6 and C6-C7. Small hemangioma within the C3 vertebral body. Facet ankylosis on the left at C2-C3 and on the left at C7-T1. Multifocal marrow edema and enhancement within the posterior elements, likely degenerative and related to facet arthropathy Cord: No signal abnormality identified within the cervical spinal cord. No pathologic spinal cord enhancement. Multilevel spinal cord flattening as described below. Posterior Fossa, vertebral arteries, paraspinal tissues: Posterior fossa assessed on same-day brain MRI. Flow voids preserved within visible portions of the cervical vertebral arteries. No paraspinal mass or collection. Nonspecific ill-defined edema and enhancement within the paraspinal soft tissues on the right at the C4-C7 levels. Disc levels: Multilevel disc degeneration, greatest at C4-C5, C5-C6 and C6-C7 (moderate-to-advanced at these levels). Also of note, disc degeneration is moderate at C7-T1. Developmentally narrow cervical spinal canal due to short pedicles. C2-C3: Facet ankylosis on the left. No significant disc herniation or stenosis. C3-C4: Grade 1 anterolisthesis. Uncovertebral hypertrophy on the right. Advanced facet arthropathy on the right. Mild ligamentum flavum thickening. Mild spinal canal stenosis. Severe right neural foraminal narrowing. C4-C5: Grade 1 anterolisthesis. Posterior disc osteophyte complex with bilateral disc osteophyte ridge/uncinate hypertrophy. Moderate facet arthropathy (greater on the left). Ligamentum flavum thickening. Severe spinal canal stenosis with spinal cord flattening. Severe bilateral neural foraminal narrowing. C5-C6: Posterior disc osteophyte complex with bilateral disc osteophyte ridge/uncinate hypertrophy. Facet arthropathy (greater on the right and moderate-to-advanced on the right). Mild ligamentum flavum thickening. Mild spinal canal stenosis. Moderate-to-severe bilateral neural foraminal narrowing (greater on the  left). C6-C7: Posterior disc osteophyte complex with bilateral disc osteophyte ridge/uncinate hypertrophy. Mild facet arthropathy. No significant spinal canal stenosis. Bilateral neural foraminal narrowing (mild-to-moderate right, moderate-to-severe left). C7-T1: Grade 1 anterolisthesis. Shallow disc bulge. Moderate facet arthropathy  on the right. Facet ankylosis on the left. Mild ligamentum flavum thickening. Mild spinal canal stenosis. The disc bulge slightly flattens the ventral aspect of the spinal cord. Severe bilateral neural foraminal narrowing. IMPRESSION: 1. Cervical spondylosis as outlined within the body of the report. 2. At C4-C5, there is multifactorial severe spinal canal stenosis (with spinal cord flattening). Severe bilateral neural foraminal narrowing. 3. No more than mild spinal canal stenosis at the remaining levels. 4. Additional sites of foraminal stenosis, greatest on the right at C3-C4 (severe), bilaterally at C5-C6 (moderate-to-severe), on the left at C6-C7 (moderate-to-severe) and bilaterally at C7-T1 (severe). 5. Disc degeneration is greatest at C4-C5, C5-C6 and C6-C7 (moderate-to-advanced in severity with mild degenerative endplate edema at these levels). 6. Grade 1 anterolisthesis at C3-C4, C4-C5 and C7-T1. 7. Levocurvature of the cervical spine. 8. Multifocal marrow edema and enhancement within the posterior elements, likely degenerative and related to facet arthropathy. 9. Facet ankylosis on the left at C2-C3 and on the left at C7-T1. 10. Nonspecific edema and enhancement within the right paraspinal soft tissues at the C4-C7 levels. Electronically Signed   By: Rockey Childs D.O.   On: 07/25/2024 19:32   MR THORACIC SPINE W WO CONTRAST Result Date: 07/25/2024 EXAM: MRI THORACIC SPINE WITH AND WITHOUT INTRAVENOUS CONTRAST 07/25/2024 05:42:39 PM TECHNIQUE: Multiplanar multisequence MRI of the thoracic spine was performed with and without the administration of intravenous contrast.  COMPARISON: None available. CLINICAL HISTORY: Bone lesion, thoracic spine, incidental. Patient reports increasing weakness in his legs, multiple falls, and stiffness in his legs. History of polio with prior multiple surgeries. Prescribed gabapentin  with no significant help. FINDINGS: BONES AND ALIGNMENT: Mild thoracic levoscoliosis. Normal vertebral body heights. Bone marrow signal is unremarkable. No abnormal enhancement. SPINAL CORD: Normal spinal cord volume. Normal spinal cord signal. SOFT TISSUES: Unremarkable. DEGENERATIVE CHANGES: Minimal degenerative disc disease. No spinal canal stenosis or neural foraminal narrowing. IMPRESSION: 1. No spinal canal stenosis or neural foraminal narrowing in the thoracic spine. 2. Mild thoracic levoscoliosis. Electronically signed by: Franky Stanford MD 07/25/2024 07:13 PM EDT RP Workstation: [id number]   MR Brain W and Wo Contrast Result Date: 07/25/2024 EXAM: MRI BRAIN WITH AND WITHOUT CONTRAST 07/25/2024 05:42:39 PM TECHNIQUE: Multiplanar multisequence MRI of the head/brain was performed with and without the administration of intravenous contrast. COMPARISON: CT head 06/06/2024 CLINICAL HISTORY: Headache, increasing frequency or severity. Patient reports increasing weakness in his legs and multiple falls. History of polio with prior multiple surgeries. FINDINGS: BRAIN AND VENTRICLES: There is a peripheral enhancing mass centered in the right frontoparietal white matter involving the centrum semiovale and extending into the periventricular white matter and corona radiata. The mass measures 4.5 x 4.5 x 2.9 cm. The mass abuts the ependymal surface of the right lateral ventricle with associated mass effect on the ventricle. Mild diffusion restriction. There is associated susceptibility along the periphery of the mass. Surrounding T2/FLAIR hyperintensity suggestive of peritumoral edema which involves the posterior aspect of the right centrum semiovale and the right  periventricular white matter. Additional T2/FLAIR hyperintensity in the periventricular and subcortical white matter suggestive of chronic microvascular ischemic changes. There is mild cerebral volume loss. No significant midline shift. No evidence of acute infarct. ORBITS: No acute abnormality. SINUSES: Mild mucosal thickening in the left maxillary sinus. Chronic deformity of the right lamina papyracea. BONES AND SOFT TISSUES: Normal bone marrow signal and enhancement. No acute soft tissue abnormality. IMPRESSION: 1. Peripheral enhancing mass in the right frontoparietal lobes measuring 4.5 x 4.5 x 2.9 cm,  with mild associated peritumoral edema. The mass abuts the ependymal surface of the right lateral ventricle, causing mass effect without significant midline shift. 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 2. Additional T2/FLAIR hyperintensity in the periventricular and subcortical white matter, suggestive of chronic microvascular ischemic changes. 3. No acute infarct. Electronically signed by: Donnice Mania MD 07/25/2024 06:59 PM EDT RP Workstation: [id number]   CT HEAD WO CONTRAST ( ) Result Date: 07/25/2024 CLINICAL DATA:  Provided history: Headache, new onset. EXAM: CT HEAD WITHOUT CONTRAST TECHNIQUE: Contiguous axial images were obtained from the base of the skull through the vertex without intravenous contrast. RADIATION DOSE REDUCTION: This exam was performed according to the departmental dose-optimization program which includes automated exposure control, adjustment of the mA and/or kV according to patient size and/or use of iterative reconstruction technique. COMPARISON:  Head CT 05/07/2024. FINDINGS: Brain: Since the head CT of 05/07/2024, interval increase in size of a lesion centered within the right frontoparietal white matter, now measuring 4.9 x 3.7 cm (for instance as seen on series 5, image 23) (series 2, image 20). The lesion also appears to extend to involve  the callosal body (for instance as seen on series 4, image 36). This is most suspicious for a mass. Centrally, the lesion is hypodense. There is ill-defined hyperdensity along the which could reflect hypercellularity, mineralization and/or associated hemorrhage. Progressive mass effect with partial effacement of the right lateral ventricle. No midline shift. No demarcated cortical infarct. No extra-axial fluid collection. Vascular: No hyperdense vessel.  Atherosclerotic calcifications. Skull: No calvarial fracture or aggressive osseous lesion. Visible sinuses/orbits: No mass or acute finding within the imaged orbits. Chronic medially displaced fracture deformity of the right lamina papyracea. Mild mucosal thickening within the right frontal sinus inferiorly. IMPRESSION: 4.9 x 3.7 cm lesion centered within the right frontoparietal white matter, as described and increased in size since the head CT of 05/07/2024. This is most suspicious for a mass (such as a high-grade primary CNS neoplasm, metastatic lesion or lymphoma). However, a brain MRI (with and without contrast) is recommended for further characterization. Progressive mass effect with partial effacement of the right lateral ventricle. No midline shift. Ill-defined hyperdensity at the periphery of the lesion which could reflect hypercellularity, mineralization and/or hemorrhage. Electronically Signed   By: Rockey Childs D.O.   On: 07/25/2024 16:20    Microbiology: Recent Results (from the past 240 hours)  Surgical PCR screen     Status: None   Collection Time: 07/29/24 10:28 PM   Specimen: Nasal Mucosa; Nasal Swab  Result Value Ref Range Status   MRSA, PCR NEGATIVE NEGATIVE Final   Staphylococcus aureus NEGATIVE NEGATIVE Final    Comment: (NOTE) The Xpert SA Assay (FDA approved for NASAL specimens in patients 67 years of age and older), is one component of a comprehensive surveillance program. It is not intended to diagnose infection nor to guide  or monitor treatment. Performed at [facility], [address], [place], [place] [address]   Aerobic/Anaerobic Culture w Gram Stain (surgical/deep wound)     Status: None (Preliminary result)   Collection Time: 07/30/24  3:31 PM   Specimen: PATH Soft tissue  Result Value Ref Range Status   Specimen Description ABSCESS  Final   Special Requests BRAIN SWAB  Final   Gram Stain NO WBC SEEN NO ORGANISMS SEEN   Final   Culture   Final    NO GROWTH 3 DAYS NO ANAEROBES ISOLATED; CULTURE IN PROGRESS FOR 5 DAYS Performed  at [facility], [address], [place], [place] [address]    Report Status PENDING  Incomplete  Aerobic/Anaerobic Culture w Gram Stain (surgical/deep wound)     Status: None (Preliminary result)   Collection Time: 07/30/24  3:33 PM   Specimen: PATH Soft tissue  Result Value Ref Range Status   Specimen Description ABSCESS  Final   Special Requests SWAB BRAIN TUMOR  Final   Gram Stain NO WBC SEEN NO ORGANISMS SEEN   Final   Culture   Final    NO GROWTH 3 DAYS NO ANAEROBES ISOLATED; CULTURE IN PROGRESS FOR 5 DAYS Performed at [facility], [address], [place], [place] [address]    Report Status PENDING  Incomplete  MRSA Next Gen by PCR, Nasal     Status: None   Collection Time: 07/30/24  4:41 PM   Specimen: Nasal Mucosa; Nasal Swab  Result Value Ref Range Status   MRSA by PCR Next Gen NOT DETECTED NOT DETECTED Final    Comment: (NOTE) The GeneXpert MRSA Assay (FDA approved for NASAL specimens only), is one component of a comprehensive MRSA colonization surveillance program. It is not intended to diagnose MRSA infection nor to guide or monitor treatment for MRSA infections. Test performance is not FDA approved in patients less than 37 years old. Performed at [facility], [address], [place], [place] [address]      Labs: Basic Metabolic Panel: Recent Labs  Lab 08/01/24 1501  NA 135  K 4.1  CL 100  CO2 24  GLUCOSE 134*   BUN 21  CREATININE 1.00  CALCIUM 9.2   Liver Function Tests: Recent Labs  Lab 08/01/24 1501  AST 24  ALT 16  ALKPHOS 53  BILITOT 0.5  PROT 6.4*  ALBUMIN 3.1*   No results for input(s): LIPASE, AMYLASE in the last 168 hours. No results for input(s): AMMONIA in the last 168 hours. CBC: Recent Labs  Lab 08/01/24 1501  WBC 16.4*  NEUTROABS 14.2*  HGB 15.3  HCT 44.5  MCV 91.0  PLT 357   Cardiac Enzymes: No results for input(s): CKTOTAL, CKMB, CKMBINDEX, TROPONINI in the last 168 hours. BNP: BNP (last 3 results) No results for input(s): BNP in the last 8760 hours.  ProBNP (last 3 results) No results for input(s): PROBNP in the last 8760 hours.  CBG: Recent Labs  Lab 08/02/24 1550 08/02/24 1938 08/02/24 2317 08/03/24 0335 08/03/24 0821  GLUCAP 162* 155* 149* 144* 105*    Signed:  Colen Grimes MD   [facility] 08/03/2024, 9:45 AM

## 2024-08-03 NOTE — Progress Notes (Signed)
 I saw the patient and his family and discussed the diagnosis of a malignant brain tumor with them. I told that them that his outcome would depend on how well he responds to chemo and radiation.  Given the amount of edema, I would like for him to stay on Decadron  2mg  q8h until he commences radiation treatment.  I will dc the staples next week.  Please don't hesitate to call me with any questions.  Dino Sable, MD Neurosurgeon

## 2024-08-04 ENCOUNTER — Encounter (HOSPITAL_COMMUNITY): Payer: Self-pay | Admitting: Physical Medicine and Rehabilitation

## 2024-08-04 ENCOUNTER — Other Ambulatory Visit: Payer: Self-pay

## 2024-08-04 ENCOUNTER — Inpatient Hospital Stay (HOSPITAL_COMMUNITY)

## 2024-08-04 DIAGNOSIS — D496 Neoplasm of unspecified behavior of brain: Secondary | ICD-10-CM

## 2024-08-04 LAB — AEROBIC/ANAEROBIC CULTURE W GRAM STAIN (SURGICAL/DEEP WOUND)
Culture: NO GROWTH
Culture: NO GROWTH
Gram Stain: NONE SEEN
Gram Stain: NONE SEEN

## 2024-08-04 LAB — URINALYSIS, W/ REFLEX TO CULTURE (INFECTION SUSPECTED)
Bacteria, UA: NONE SEEN
Bilirubin Urine: NEGATIVE
Glucose, UA: 50 mg/dL — AB
Ketones, ur: NEGATIVE mg/dL
Leukocytes,Ua: NEGATIVE
Nitrite: NEGATIVE
Protein, ur: NEGATIVE mg/dL
Specific Gravity, Urine: 1.023 (ref 1.005–1.030)
pH: 5 (ref 5.0–8.0)

## 2024-08-04 LAB — GLUCOSE, CAPILLARY
Glucose-Capillary: 124 mg/dL — ABNORMAL HIGH (ref 70–99)
Glucose-Capillary: 132 mg/dL — ABNORMAL HIGH (ref 70–99)

## 2024-08-04 MED ORDER — BACLOFEN 5 MG HALF TABLET
5.0000 mg | ORAL_TABLET | Freq: Three times a day (TID) | ORAL | Status: DC
  Administered 2024-08-04 – 2024-08-05 (×4): 5 mg via ORAL
  Filled 2024-08-04 (×4): qty 1

## 2024-08-04 NOTE — Progress Notes (Signed)
 Inpatient Rehabilitation Admission Medication Review by a Pharmacist  A complete drug regimen review was completed for this patient to identify any potential clinically significant medication issues.  High Risk Drug Classes Is patient taking? Indication by Medication  Antipsychotic No   Anticoagulant No   Antibiotic No   Opioid No   Antiplatelet No   Hypoglycemics/insulin  No   Vasoactive Medication No   Chemotherapy No   Other Yes Dexamethasone  - brain mass  Pantoprazole  - Reflux  Fioricet - prn headaches Ondansetron  prn N/V     Type of Medication Issue Identified Description of Issue Recommendation(s)  Drug Interaction(s) (clinically significant)     Duplicate Therapy     Allergy     No Medication Administration End Date     Incorrect Dose     Additional Drug Therapy Needed     Significant med changes from prior encounter (inform family/care partners about these prior to discharge).    Other       Clinically significant medication issues were identified that warrant physician communication and completion of prescribed/recommended actions by midnight of the next day:  No  Name of provider notified for urgent issues identified:   Provider Method of Notification:     Pharmacist comments: None  Time spent performing this drug regimen review (minutes):  20 minutes  Thank you. Olam Monte, PharmD

## 2024-08-04 NOTE — Plan of Care (Signed)
  Problem: Consults Goal: RH GENERAL PATIENT EDUCATION Description: See Patient Education module for education specifics. Outcome: Progressing   Problem: RH BOWEL ELIMINATION Goal: RH STG MANAGE BOWEL WITH ASSISTANCE Description: STG Manage Bowel with supervision-min  Assistance. Outcome: Progressing   Problem: RH BLADDER ELIMINATION Goal: RH STG MANAGE BLADDER WITH ASSISTANCE Description: STG Manage Bladder With supervision-min Assistance Outcome: Progressing   Problem: RH SKIN INTEGRITY Goal: RH STG SKIN FREE OF INFECTION/BREAKDOWN Description: Manage skin free of infection with supervision- min assistance  Outcome: Progressing   Problem: RH SAFETY Goal: RH STG ADHERE TO SAFETY PRECAUTIONS W/ASSISTANCE/DEVICE Description: STG Adhere to Safety Precautions With supervision-min Assistance/Device. Outcome: Progressing   Problem: RH PAIN MANAGEMENT Goal: RH STG PAIN MANAGED AT OR BELOW PT'S PAIN GOAL Description: < 4 w/ prns Outcome: Progressing   Problem: RH KNOWLEDGE DEFICIT GENERAL Goal: RH STG INCREASE KNOWLEDGE OF SELF CARE AFTER HOSPITALIZATION Description: Manage increase knowledge of self care after hospitalization with supervision- min assistance from partner using educational materials provided Outcome: Progressing

## 2024-08-04 NOTE — Evaluation (Signed)
 Speech Language Pathology Assessment and Plan  Patient Details  Name: Joshua Harmon MRN: [id number] Date of Birth: 1960-12-01  SLP Diagnosis: Cognitive Impairments  Rehab Potential: Good ELOS: 3 weeks    Today's Date: 08/04/2024 SLP Individual Time: 1000-1058 SLP Individual Time Calculation (min): 58 min   Hospital Problem: Principal Problem:   Brain tumor [facility])  Past Medical History:  Past Medical History:  Diagnosis Date   GERD (gastroesophageal reflux disease)    Past Surgical History:  Past Surgical History:  Procedure Laterality Date   APPLICATION OF CRANIAL NAVIGATION Right 07/30/2024   Procedure: COMPUTER-ASSISTED NAVIGATION, FOR CRANIAL PROCEDURE;  Surgeon: Rosslyn Dino HERO, MD;  Location: [facility] OR;  Service: Neurosurgery;  Laterality: Right;  CRANIAL NAVIGATION   LEG SURGERY Bilateral as a child   STERIOTACTIC STIMULATOR INSERTION Right 07/30/2024   Procedure: RIGHT STEREOTACTIC BIOPSY;  Surgeon: Rosslyn Dino HERO, MD;  Location: [facility] OR;  Service: Neurosurgery;  Laterality: Right;  RIGHT STERIOTACTIC BRAIN BIOPSY   XI ROBOTIC ASSISTED INGUINAL HERNIA REPAIR WITH MESH Right 02/19/2020   Procedure: XI ROBOTIC ASSISTED Right INGUINAL HERNIA REPAIR WITH MESH;  Surgeon: Jordis Laneta FALCON, MD;  Location: [facility] ORS;  Service: General;  Laterality: Right;    Assessment / Plan / Recommendation Clinical Impression Pt is a 63 year old male with 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 Per chart review the symptoms have been ongoing for several weeks but had worsened causing him difficulty with ambulation where he was holding onto furniture in his house to get around. No history of significant falls or LOC. The patient was admitted to [facility] for evaluation and was found to have a brain mass. 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 A CT of the chest, abdomen, and pelvis showed no evidence of primary malignancy or metastatic disease. An MRI of the brain revealed a peripheral enhancing mass in the right frontal temporal region. He was transferred to [facility] and admitted on 07/26/2024 for neurosurgical evaluation. Where he underwent a right stereotactic brain biopsy on 07-30-2024 by Dr. Rosslyn. A repeat head CT showed a small volume hemorrhage along the biopsy track.   Cognitive-Linguistic: Pt presented w/ moderate cognitive deficits overall. He completed the [facility] Mental Status (SLUMS) Exam, which revealed a score of 17/30. Throughout functional and structured eval tasks, he presented w/ deficits in the areas of: IMM, STM, selective attention, L visual attention, and problem solving. Initially minimal to no awareness noted. He initially denied any changes in performance, but did acknowledge memory deficits once reported by SLP. He did not appear to present w/ any L visual attention deficits or neglect, however, errors during structured subtests, such as clock drawing and pattern recreation, were c/w L visual inattention. Pt remained 95% intelligible or greater throughout eval. Anticipate intermittent errors were d/t atttention vs true motor speech deficits. No concern re expressive/receptive language. He would benefit from skilled ST services to target cognitve deficits, maximize pt independence, and facilitate return back to prev roles/responsibilities.   Bedside Swallow: No concern re oropharyngeal dysphagia. Pt demonstrated adequate mastication and oral clearance when provided w/ regular textures and thin liquids via straws. No overt s/s of airway invasion noted either. Dysphagia intervention  not warranted at this time.      Skilled Therapeutic Interventions           SLP facilitated a cognitive-linguistic evaluation and brief bedside swallow eval to assess pt's cognitive-communication skills and determine need for additional skilled ST services. See above for more information.    SLP Assessment  Patient will need skilled Speech Lanaguage Pathology Services during CIR admission    Recommendations  SLP Diet Recommendations: Thin;Age appropriate regular solids Liquid Administration via: Straw Medication Administration: Whole meds with liquid Supervision: Patient able to self feed Compensations: Minimize environmental distractions Postural Changes and/or Swallow Maneuvers: Seated upright 90 degrees;Upright 30-60 min after meal Oral Care Recommendations: Oral care BID Recommendations for Other Services: Neuropsych consult;Therapeutic Recreation consult Therapeutic Recreation Interventions: Pet therapy;Stress management;Outing/community reintergration Patient destination: Home Follow up Recommendations: 24 hour supervision/assistance;Outpatient SLP Equipment Recommended: None recommended by SLP    SLP Frequency 3 to 5 out of 7 days   SLP Duration  SLP Intensity  SLP Treatment/Interventions 3 weeks  Minumum of 1-2 x/day, 30 to 90 minutes  Cognitive remediation/compensation;Cueing hierarchy;Functional tasks;Patient/family education;Speech/Language facilitation;Therapeutic Exercise;Environmental controls    Pain  None reported  Prior Functioning  independent  SLP Evaluation Cognition Overall Cognitive Status: Impaired/Different from baseline Arousal/Alertness: Awake/alert Orientation Level: Oriented to person;Oriented to place;Oriented to situation Year: 2025 Month: September Day of Week: Correct Attention: Selective Sustained Attention: Appears intact Selective Attention: Impaired Selective Attention Impairment: Verbal basic;Functional basic Memory: Impaired Memory Impairment: Decreased recall of new information;Decreased short term  memory;Storage deficit Decreased Short Term Memory: Verbal basic;Functional basic Awareness: Impaired Awareness Impairment: Intellectual impairment Problem Solving: Impaired Problem Solving Impairment: Verbal basic;Functional basic Executive Function: Self Monitoring;Self Correcting;Organizing Organizing: Impaired Self Monitoring: Impaired Self Correcting: Impaired Safety/Judgment: Impaired Comments: limited to no insight into deficits  Comprehension Auditory Comprehension Overall Auditory Comprehension: Appears within functional limits for tasks assessed Expression Expression Primary Mode of Expression: Verbal Verbal Expression Overall Verbal Expression: Appears within functional limits for tasks assessed Written Expression Dominant Hand: Right Oral Motor Oral Motor/Sensory Function Overall Oral Motor/Sensory Function: Within functional limits Motor Speech Overall Motor Speech: Appears within functional limits for tasks assessed Intelligibility: Intelligible  Care Tool Care Tool Cognition Ability to hear (with hearing aid or hearing appliances if normally used Ability to hear (with hearing aid or hearing appliances if normally used): 0. Adequate - no difficulty in normal conservation, social interaction, listening to TV   Expression of Ideas and Wants Expression of Ideas and Wants: 4. Without difficulty (complex and basic) - expresses complex messages without difficulty and with speech that is clear and easy to understand   Understanding Verbal and Non-Verbal Content Understanding Verbal and Non-Verbal Content: 3. Usually understands - understands most conversations, but misses some part/intent of message. Requires cues at times to understand  Memory/Recall Ability Memory/Recall Ability : Current season;Location of own room;That he or she is in a hospital/hospital unit   Motor Speech Assessment  Intelligibility: Intelligible  Bedside Swallowing Assessment See clinical  impression  Short Term Goals: Week 1: SLP Short Term Goal 1 (Week 1): Pt will utilize compensatory memory strategies as needed to recall recent/relevant info w/ minA SLP Short Term Goal 2 (Week 1): Pt will sustain attention to task in a mildly distracting environment for ~10 mins given minA SLP Short Term Goal 3 (Week 1): Pt will solve functional problems w/ minA SLP Short Term Goal 4 (Week 1): Pt will improve L visual attention to minA SLP Short Term Goal 5 (Week 1): Pt  will recall 2 changes in cognition given modA  Refer to Care Plan for Long Term Goals  Recommendations for other services: Neuropsych and Therapeutic Recreation  Pet therapy, Stress management, and Outing/community reintegration  Discharge Criteria: Patient will be discharged from SLP if patient refuses treatment 3 consecutive times without medical reason, if treatment goals not met, if there is a change in medical status, if patient makes no progress towards goals or if patient is discharged from hospital.  The above assessment, treatment plan, treatment alternatives and goals were discussed and mutually agreed upon: by patient  [name] 08/04/2024, 12:47 PM

## 2024-08-04 NOTE — Plan of Care (Signed)
  Problem: RH Problem Solving Goal: LTG Patient will demonstrate problem solving for (SLP) Description: LTG:  Patient will demonstrate problem solving for basic/complex daily situations with cues  (SLP) Flowsheets (Taken 08/04/2024 1644) LTG: Patient will demonstrate problem solving for (SLP):  Basic daily situations  Complex daily situations LTG Patient will demonstrate problem solving for: Supervision Note: Mildly complex   Problem: RH Memory Goal: LTG Patient will demonstrate ability for day to day (SLP) Description: LTG:   Patient will demonstrate ability for day to day recall/carryover during cognitive/linguistic activities with assist  (SLP) Flowsheets (Taken 08/04/2024 1644) LTG: Patient will demonstrate ability for day to day recall:  New information  Daily complex information LTG: Patient will demonstrate ability for day to day recall/carryover during cognitive/linguistic activities with assist (SLP): Supervision Note: Utilizing compensatory memory strategies as needed   Problem: RH Attention Goal: LTG Patient will demonstrate this level of attention during functional activites (SLP) Description: LTG:  Patient will will demonstrate this level of attention during functional activites (SLP) Flowsheets (Taken 08/04/2024 1644) Patient will demonstrate during cognitive/linguistic activities the attention type of: Selective Patient will demonstrate this level of attention during cognitive/linguistic activities in: Controlled LTG: Patient will demonstrate this level of attention during cognitive/linguistic activities with assistance of (SLP): Supervision Number of minutes patient will demonstrate attention during cognitive/linguistic activities: 25   Problem: RH Awareness Goal: LTG: Patient will demonstrate awareness during functional activites type of (SLP) Description: LTG: Patient will demonstrate awareness during functional activites type of (SLP) Flowsheets (Taken 08/04/2024  1644) Patient will demonstrate during cognitive/linguistic activities awareness type of: Intellectual LTG: Patient will demonstrate awareness during cognitive/linguistic activities with assistance of (SLP): Minimal Assistance - Patient > 75%

## 2024-08-04 NOTE — Progress Notes (Signed)
 Occupational Therapy Assessment and Plan  Patient Details  Name: Joshua Harmon MRN: [id number] Date of Birth: 1961-11-14  OT Diagnosis: abnormal posture, ataxia, cognitive deficits, and hemiplegia affecting non-dominant side Rehab Potential: Rehab Potential (ACUTE ONLY): Good ELOS: 3-4 weeks   Today's Date: 08/04/2024 OT Individual Time: 9169-9054 OT Individual Time Calculation (min): 75 min     Hospital Problem: Principal Problem:   Brain tumor [facility])   Past Medical History:  Past Medical History:  Diagnosis Date   GERD (gastroesophageal reflux disease)    Past Surgical History:  Past Surgical History:  Procedure Laterality Date   APPLICATION OF CRANIAL NAVIGATION Right 07/30/2024   Procedure: COMPUTER-ASSISTED NAVIGATION, FOR CRANIAL PROCEDURE;  Surgeon: Rosslyn Dino HERO, MD;  Location: [facility] OR;  Service: Neurosurgery;  Laterality: Right;  CRANIAL NAVIGATION   LEG SURGERY Bilateral as a child   STERIOTACTIC STIMULATOR INSERTION Right 07/30/2024   Procedure: RIGHT STEREOTACTIC BIOPSY;  Surgeon: Rosslyn Dino HERO, MD;  Location: [facility] OR;  Service: Neurosurgery;  Laterality: Right;  RIGHT STERIOTACTIC BRAIN BIOPSY   XI ROBOTIC ASSISTED INGUINAL HERNIA REPAIR WITH MESH Right 02/19/2020   Procedure: XI ROBOTIC ASSISTED Right INGUINAL HERNIA REPAIR WITH MESH;  Surgeon: Jordis Laneta FALCON, MD;  Location: [facility] ORS;  Service: General;  Laterality: Right;    Assessment & Plan Clinical Impression:Joshua Harmon is a 63 year old male with past medical history of polio as a child, Hx of inguinal hernia s/p post robotic assisted repair who presented to [facility] on 07/[facility]/2024 with symptoms of ataxia and leg weakness.  Per chart review the symptoms have been ongoing for several weeks but had worsened causing him difficulty with ambulation where he was holding onto furniture in his house to get around.  No history of significant falls or LOC. The patient was admitted to [facility] for evaluation and was found to have a brain mass.  A CT of the head revealed a 4.9 x 3.7 centimeter lesion centered within the right frontal parietal white matter, suspicious for a high-grade primary CNS neoplasm or metastatic lesion. A CT of the chest, abdomen, and pelvis showed no evidence of primary malignancy or metastatic disease.  An MRI of the brain revealed a peripheral enhancing mass in the right frontal temporal region. He was transferred to [facility] and admitted on 07/26/2024 for neurosurgical evaluation.  Where he underwent a right stereotactic brain biopsy on 07-30-2024 by Dr. Rosslyn.  A repeat head CT showed a small volume hemorrhage along the biopsy track. Prior to arrival, the patient was independent with self-care ADLs but currently required moderate to maximal assistance with cubicle mobility and minimal to maximal assistance with basic ADLs.   Therapy evaluations completed due to patient decreased functional mobility was admitted for a comprehensive rehab program.  Patient transferred to CIR on 08/03/2024 .    Patient currently requires max with basic self-care skills secondary to muscle weakness and muscle joint tightness, decreased cardiorespiratoy endurance, impaired timing and sequencing, abnormal tone, unbalanced muscle activation, ataxia, decreased coordination, and decreased motor planning, head turn, decreased attention to left, decreased attention, decreased awareness, decreased problem solving, decreased safety awareness, and delayed processing, and decreased sitting balance, decreased standing balance, decreased postural control, hemiplegia, and decreased balance strategies.  Prior to hospitalization, patient could complete ADLs with independent .  Patient will benefit from skilled intervention to increase independence with basic self-care skills prior to discharge home with care partner.  Anticipate patient will require 24 hour supervision and  minimal physical  assistance and follow up home health.  OT - End of Session Activity Tolerance: Tolerates 30+ min activity without fatigue Endurance Deficit: No OT Assessment Rehab Potential (ACUTE ONLY): Good OT Patient demonstrates impairments in the following area(s): Balance;Cognition;Endurance;Motor;Perception;Safety;Sensory;Vision OT Basic ADL's Functional Problem(s): Toileting;Dressing;Bathing;Grooming;Eating OT Advanced ADL's Functional Problem(s): None OT Transfers Functional Problem(s): Toilet;Tub/Shower OT Additional Impairment(s): Fuctional Use of Upper Extremity OT Plan OT Intensity: Minimum of 1-2 x/day, 45 to 90 minutes OT Frequency: 5 out of 7 days OT Duration/Estimated Length of Stay: 3-4 weeks OT Treatment/Interventions: DME/adaptive equipment instruction;Balance/vestibular training;Patient/family education;Therapeutic Activities;Cognitive remediation/compensation;Functional electrical stimulation;Wheelchair propulsion/positioning;Psychosocial support;Therapeutic Exercise;Functional mobility training;Community reintegration;Self Care/advanced ADL retraining;UE/LE Strength taining/ROM;Discharge planning;Neuromuscular re-education;Skin care/wound managment;UE/LE Coordination activities;Disease mangement/prevention;Pain management;Visual/perceptual remediation/compensation OT Self Feeding Anticipated Outcome(s): mod I OT Basic Self-Care Anticipated Outcome(s): (S) OT Toileting Anticipated Outcome(s): (S) OT Bathroom Transfers Anticipated Outcome(s): CGA OT Recommendation Recommendations for Other Services: None Patient destination: Home Follow Up Recommendations: Home health OT Equipment Recommended: To be determined   OT Evaluation Precautions/Restrictions  Precautions Precautions: Fall Precaution/Restrictions Comments: Poor awareness, L lean, ataxic Restrictions Weight Bearing Restrictions Per Provider Order: No General Chart Reviewed: Yes Family/Caregiver Present: No   Home  Living/Prior Functioning Home Living Family/patient expects to be discharged to:: Private residence Living Arrangements: Spouse/significant other Available Help at Discharge: Family Type of Home: House Home Access: Stairs to enter Secretary/administrator of Steps: 3 Entrance Stairs-Rails: None Home Layout: One level Bathroom Shower/Tub: Engineer, manufacturing systems: Standard  Lives With: Significant other IADL History Homemaking Responsibilities: Yes Meal Prep Responsibility: Primary Laundry Responsibility: Primary Cleaning Responsibility: Primary Bill Paying/Finance Responsibility: Primary Shopping Responsibility: Primary Current License: Yes Occupation: Retired Type of Occupation: TV Prior Function Level of Independence: Independent with basic ADLs, Independent with transfers, Independent with gait Vocation: Retired Administrator, sports Baseline Vision/History: 0 No visual deficits Ability to See in Adequate Light: 0 Adequate Patient Visual Report: No change from baseline Vision Assessment?: Yes Eye Alignment: Within Functional Limits Alignment/Gaze Preference: Head tilt;Head turned Perception  Perception: Impaired Perception-Other Comments: L inattention Praxis Praxis: Impaired Praxis Impairment Details: Limb apraxia Cognition Cognition Overall Cognitive Status: Impaired/Different from baseline Arousal/Alertness: Awake/alert Orientation Level: Person;Place;Situation Person: Oriented Place: Oriented Situation: Oriented Memory: Impaired Memory Impairment: Decreased recall of new information;Decreased short term memory Decreased Short Term Memory: Verbal basic;Functional basic Attention: Sustained;Selective Sustained Attention: Appears intact Selective Attention: Impaired Selective Attention Impairment: Verbal basic;Functional basic Awareness: Impaired Awareness Impairment: Intellectual impairment Problem Solving: Impaired Safety/Judgment: Impaired Comments: Poor  awareness of deficits Brief Interview for Mental Status (BIMS) Repetition of Three Words (First Attempt): 3 Temporal Orientation: Year: Correct Temporal Orientation: Month: Accurate within 5 days Temporal Orientation: Day: Incorrect Recall: Sock: No, could not recall Recall: Blue: Yes, no cue required Recall: Bed: No, could not recall BIMS Summary Score: 10 Sensation Sensation Light Touch: Impaired Detail Peripheral sensation comments: Reports intermittent tingling in hands and feet. Coordination Gross Motor Movements are Fluid and Coordinated: No Fine Motor Movements are Fluid and Coordinated: No Coordination and Movement Description: L UE and LE ataxia Finger Nose Finger Test: WNL on the R, impaired on L 2/2 ataxia Motor  Motor Motor: Ataxia;Hemiplegia Motor - Skilled Clinical Observations: L knee hyperextension in standing with poor proprioception and ataxia  Trunk/Postural Assessment  Cervical Assessment Cervical Assessment: Exceptions to [facility] (large head tilt R and forward head) Thoracic Assessment Thoracic Assessment: Exceptions to [facility] (kyphotic posture) Lumbar Assessment Lumbar Assessment: Exceptions to [facility] (large posterior pelvic tilt in sitting) Postural Control Postural Control: Deficits on evaluation Trunk  Control: decreased, poor proprioception and ataxia. Sits in large posterior tilt to compensate Righting Reactions: delayed and inadequate  Balance Balance Balance Assessed: Yes Static Sitting Balance Static Sitting - Balance Support: Feet supported Static Sitting - Level of Assistance: 4: Min assist Static Sitting - Comment/# of Minutes: Able to achieve static sitting balance with a large posterior pelvic tilt Dynamic Sitting Balance Dynamic Sitting - Balance Support: Feet supported Dynamic Sitting - Level of Assistance: 3: Mod assist Static Standing Balance Static Standing - Balance Support: Right upper extremity supported Static Standing - Level of  Assistance: 3: Mod assist Dynamic Standing Balance Dynamic Standing - Balance Support: Right upper extremity supported Dynamic Standing - Level of Assistance: 2: Max assist Extremity/Trunk Assessment RUE Assessment RUE Assessment: Within Functional Limits LUE Assessment LUE Assessment: Exceptions to [facility] General Strength Comments: Flexor synergy tone occasionally. L ataxia  Care Tool Care Tool Self Care Eating   Eating Assist Level: Minimal Assistance - Patient > 75%    Oral Care    Oral Care Assist Level: Minimal Assistance - Patient > 75%    Bathing   Body parts bathed by patient: Left arm;Chest;Abdomen;Front perineal area;Buttocks;Right upper leg;Left upper leg;Face Body parts bathed by helper: Right lower leg;Left lower leg Body parts n/a: Right arm Assist Level: Maximal Assistance - Patient 24 - 49%    Upper Body Dressing(including orthotics)   What is the patient wearing?: Pull over shirt   Assist Level: Maximal Assistance - Patient 25 - 49%    Lower Body Dressing (excluding footwear)   What is the patient wearing?: Underwear/pull up;Pants Assist for lower body dressing: Total Assistance - Patient < 25%    Putting on/Taking off footwear   What is the patient wearing?: Non-skid slipper socks Assist for footwear: Total Assistance - Patient < 25%       Care Tool Toileting Toileting activity   Assist for toileting: Maximal Assistance - Patient 25 - 49%     Care Tool Bed Mobility Roll left and right activity   Roll left and right assist level: Moderate Assistance - Patient 50 - 74%    Sit to lying activity   Sit to lying assist level: Moderate Assistance - Patient 50 - 74%    Lying to sitting on side of bed activity   Lying to sitting on side of bed assist level: the ability to move from lying on the back to sitting on the side of the bed with no back support.: Moderate Assistance - Patient 50 - 74%     Care Tool Transfers Sit to stand transfer   Sit to stand  assist level: Maximal Assistance - Patient 25 - 49%    Chair/bed transfer   Chair/bed transfer assist level: Maximal Assistance - Patient 25 - 49%     Toilet transfer   Assist Level: Maximal Assistance - Patient 24 - 49%     Care Tool Cognition  Expression of Ideas and Wants Expression of Ideas and Wants: 3. Some difficulty - exhibits some difficulty with expressing needs and ideas (e.g, some words or finishing thoughts) or speech is not clear  Understanding Verbal and Non-Verbal Content Understanding Verbal and Non-Verbal Content: 3. Usually understands - understands most conversations, but misses some part/intent of message. Requires cues at times to understand   Memory/Recall Ability Memory/Recall Ability : Current season;Location of own room;Staff names and faces;That he or she is in a hospital/hospital unit   Refer to Care Plan for Long Term Goals  SHORT TERM  GOAL WEEK 1 OT Short Term Goal 1 (Week 1): Pt will don shirt with mod A OT Short Term Goal 2 (Week 1): Pt will complete a stand pivot transfer to the w/c with mod A OT Short Term Goal 3 (Week 1): Pt will improve LUE ataxia by using the LUE during bathing with min A OT Short Term Goal 4 (Week 1): Pt will don pants with mod A OT Short Term Goal 5 (Week 1): Pt will right trunk and head in sitting with no more than mod facilitation  Recommendations for other services: Neuropsych   Skilled Therapeutic Intervention ADL ADL Eating: Minimal assistance Grooming: Minimal assistance Where Assessed-Grooming: Wheelchair Upper Body Bathing: Minimal assistance Where Assessed-Upper Body Bathing: Shower Lower Body Bathing: Moderate assistance Where Assessed-Lower Body Bathing: Shower Upper Body Dressing: Maximal assistance Where Assessed-Upper Body Dressing: Sitting at sink Lower Body Dressing: Maximal assistance Where Assessed-Lower Body Dressing: Sitting at sink Toileting: Maximal assistance Where Assessed-Toileting:  Toilet;Bedside Commode Toilet Transfer: Maximal assistance Toilet Transfer Method: Stand pivot Toilet Transfer Equipment: Psychiatric nurse: Maximal assistance Film/video editor Method: Warden/ranger: Shower seat with back Mobility  Bed Mobility Bed Mobility: Supine to Sit;Sit to Supine Supine to Sit: Moderate Assistance - Patient 50-74% Sit to Supine: Moderate Assistance - Patient 50-74% Transfers Sit to Stand: Maximal Assistance - Patient 25-49% Stand to Sit: Moderate Assistance - Patient 50-74%   Skilled OT evaluation completed with the creation of pt centered OT POC. Pt educated on condition, ELOS, rehab expectations, and fall risk reduction strategies throughout session. Pt is an excellent CIR candidate, he is incredibly motivated to return to PLOF. His L inattention, L Hemiplegia, L ataxia, poor trunk control, and poor awareness all impact his ability to perform ADLs and [facility] at [facility]. He is currently requiring max A. Full shower completed as described above. Mod-max A stand pivot transfers with mod cueing for sequencing and motor planning, with heavy facilitation required at the trunk. He required frequent cueing and extensive education on importance of L attention and vision to attend to the LUE. Flexor tone present in LUE. He was left sitting up in the w/c with all needs met, chair alarm set.    Discharge Criteria: Patient will be discharged from OT if patient refuses treatment 3 consecutive times without medical reason, if treatment goals not met, if there is a change in medical status, if patient makes no progress towards goals or if patient is discharged from hospital.  The above assessment, treatment plan, treatment alternatives and goals were discussed and mutually agreed upon: by patient  Nena VEAR Moats 08/04/2024, 9:35 AM

## 2024-08-04 NOTE — Evaluation (Signed)
 Physical Therapy Assessment and Plan  Patient Details  Name: Joshua Harmon MRN: [id number] Date of Birth: 07-12-1961  PT Diagnosis: Ataxia, Ataxic gait, Difficulty walking, Hemiplegia non-dominant, Impaired cognition, Impaired sensation, and Muscle weakness Rehab Potential: Good ELOS: 21-24 days   Today's Date: 08/04/2024 PT Individual Time: 8691-8581 PT Individual Time Calculation (min): 70 min    Hospital Problem: Principal Problem:   Brain tumor [facility])   Past Medical History:  Past Medical History:  Diagnosis Date   GERD (gastroesophageal reflux disease)    Past Surgical History:  Past Surgical History:  Procedure Laterality Date   APPLICATION OF CRANIAL NAVIGATION Right 07/30/2024   Procedure: COMPUTER-ASSISTED NAVIGATION, FOR CRANIAL PROCEDURE;  Surgeon: Rosslyn Dino HERO, MD;  Location: [facility] OR;  Service: Neurosurgery;  Laterality: Right;  CRANIAL NAVIGATION   LEG SURGERY Bilateral as a child   STERIOTACTIC STIMULATOR INSERTION Right 07/30/2024   Procedure: RIGHT STEREOTACTIC BIOPSY;  Surgeon: Rosslyn Dino HERO, MD;  Location: [facility] OR;  Service: Neurosurgery;  Laterality: Right;  RIGHT STERIOTACTIC BRAIN BIOPSY   XI ROBOTIC ASSISTED INGUINAL HERNIA REPAIR WITH MESH Right 02/19/2020   Procedure: XI ROBOTIC ASSISTED Right INGUINAL HERNIA REPAIR WITH MESH;  Surgeon: Jordis Laneta FALCON, MD;  Location: [facility] ORS;  Service: General;  Laterality: Right;    Assessment & Plan Clinical Impression: Patient is a 63 year old male with past medical history of polio as a child, Hx of inguinal hernia s/p post robotic assisted repair who presented to [facility] on 07/[facility]/2024 with symptoms of ataxia and leg weakness. Per chart review the symptoms have been ongoing for several weeks but had worsened causing him difficulty with ambulation where he was holding onto furniture in his house to get around. No history of significant falls or LOC. The patient was admitted to [facility]  for evaluation and was found to have a brain mass. A CT of the head revealed a 4.9 x 3.7 centimeter lesion centered within the right frontal parietal white matter, suspicious for a high-grade primary CNS neoplasm or metastatic lesion. A CT of the chest, abdomen, and pelvis showed no evidence of primary malignancy or metastatic disease. An MRI of the brain revealed a peripheral enhancing mass in the right frontal temporal region. He was transferred to [facility] and admitted on 07/26/2024 for neurosurgical evaluation. Where he underwent a right stereotactic brain biopsy on 07-30-2024 by Dr. Rosslyn. A repeat head CT showed a small volume hemorrhage along the biopsy track. Prior to arrival, the patient was independent with self-care ADLs but currently required moderate to maximal assistance with cubicle mobility and minimal to maximal assistance with basic ADLs. .  Patient transferred to CIR on 08/03/2024 .   Patient currently requires max with mobility secondary to muscle weakness, impaired timing and sequencing, ataxia, and decreased coordination, and decreased standing balance, decreased postural control, hemiplegia, and decreased balance strategies.  Prior to hospitalization, patient was independent  with mobility and lived with Significant other in a House home.  Home access is 3Stairs to enter.  Patient will benefit from skilled PT intervention to maximize safe functional mobility, minimize fall risk, and decrease caregiver burden for planned discharge home with 24 hour assist.  Anticipate patient will benefit from follow up OP at discharge.    PT - End of Session   PT - End of Session Activity Tolerance: Tolerates 30+ min activity with multiple rests Endurance Deficit: No    PT - Assessment   PT Assessment Rehab Potential (ACUTE/IP ONLY):  Good PT Barriers to Discharge: Home environment access/layout PT Barriers to Discharge Comments: 3 STE with no rails. No plans to add rails per pt  report PT Patient demonstrates impairments in the following area(s): Balance;Behavior;Endurance;Motor;Perception;Pain;Safety;Sensory;Edema PT Transfers Functional Problem(s): Bed Mobility;Car;Bed to Chair PT Locomotion Functional Problem(s): Ambulation;Stairs   PT Plan   PT Plan PT Intensity: Minimum of 1-2 x/day ,45 to 90 minutes PT Frequency: 5 out of 7 days PT Duration Estimated Length of Stay: 21-24 days PT Treatment/Interventions: Ambulation/gait training;Functional mobility training;Therapeutic Activities;Balance/vestibular training;Neuromuscular re-education;Therapeutic Exercise;Cognitive remediation/compensation;Pain management;UE/LE Strength taining/ROM;Community reintegration;Patient/family education;UE/LE Coordination activities;Discharge planning;Psychosocial support;Visual/perceptual remediation/compensation;Disease management/prevention;Skin care/wound management;Wheelchair propulsion/positioning;DME/adaptive equipment instruction;Splinting/orthotics;Functional electrical stimulation;Stair training PT Transfers Anticipated Outcome(s): CGA + LRAD PT Locomotion Anticipated Outcome(s): CGA + LRAD   PT Recommendation   PT Recommendation Recommendations for Other Services: None Follow Up Recommendations: Outpatient PT Patient destination: Home Equipment Recommended: To be determined      PT Evaluation Precautions/Restrictions   Precautions Precautions: Fall Recall of Precautions/Restrictions: Impaired Precaution/Restrictions Comments: Poor awareness, L lean, ataxic Restrictions Weight Bearing Restrictions Per Provider Order: No    General   Chart Reviewed: Yes Family/Caregiver Present: Yes  Vital SignsTherapy Vitals Temp: 98.4 F (36.9 C) Temp Source: Oral Pulse Rate: (!) 108 Resp: 20 BP: 128/88 Patient Position (if appropriate): Sitting Oxygen Therapy SpO2: 95 % O2 Device: Room Air   Pain   Pain Assessment Pain Scale: 0-10 Pain Score: 0-No pain    Pain Interference   Pain Interference Pain Effect on Sleep: 1. Rarely or not at all Pain Interference with Therapy Activities: 1. Rarely or not at all Pain Interference with Day-to-Day Activities: 1. Rarely or not at all  Home Living/Prior Functioning   Home Living Available Help at Discharge: Family;Available 24 hours/day Type of Home: House Home Access: Stairs to enter [employer] of Steps: 3 Entrance Stairs-Rails: None Home Layout: One level Bathroom Shower/Tub: Engineer, manufacturing systems: Standard Bathroom Accessibility: Yes  Lives With: Significant other Prior Function Level of Independence: Independent with basic ADLs;Independent with transfers;Independent with gait  Able to Take Stairs?: Yes    Vision/Perception    Vision - History Ability to See in Adequate Light: 0 Adequate   Cognition   Overall Cognitive Status: Impaired/Different from baseline Arousal/Alertness: Awake/alert Orientation Level: Oriented X4 Year: 2025 Month: September Day of Week: Correct Attention: Selective Selective Attention: Impaired Selective Attention Impairment: Verbal basic;Functional basic Memory: Impaired Memory Impairment: Decreased recall of new information;Decreased short term memory;Storage deficit Decreased Short Term Memory: Verbal basic;Functional basic Awareness: Impaired Awareness Impairment: Intellectual impairment Problem Solving: Impaired Problem Solving Impairment: Verbal basic;Functional basic Executive Function: Self Monitoring;Self Correcting;Organizing Organizing: Impaired Self Monitoring: Impaired Self Correcting: Impaired Safety/Judgment: Impaired Comments: limited to no insight into deficits  Sensation   Sensation Light Touch: Impaired Detail Peripheral sensation comments: Reports intermittent tingling in hands and feet. Coordination Gross Motor Movements are Fluid and Coordinated: No Fine Motor Movements are Fluid and Coordinated:  No Coordination and Movement Description: RLE limited 2/2 DF strength Heel Shin Test: BLE impaired  Motor    Motor Motor: Ataxia;Hemiplegia Motor - Skilled Clinical Observations: L knee hyperextension in standing with poor proprioception and ataxia   Trunk/Postural Assessment    Cervical Assessment Cervical Assessment: Exceptions to [facility] (head tilt R and forward head) Thoracic Assessment Thoracic Assessment: Exceptions to [facility] (rounded shoulders) Lumbar Assessment Lumbar Assessment: Exceptions to [facility] (PPT) Postural Control Postural Control: Deficits on evaluation Trunk Control: decreased, poor proprioception and ataxia. Sits in large posterior tilt to compensate Righting Reactions:  delayed and inadequate   Balance   Balance Balance Assessed: Yes Static Sitting Balance Static Sitting - Balance Support: Feet supported Static Sitting - Level of Assistance: 4: Min assist Static Sitting - Comment/# of Minutes: Able to achieve static sitting balance with a large posterior pelvic tilt Dynamic Sitting Balance Dynamic Sitting - Balance Support: Feet supported Dynamic Sitting - Level of Assistance: 3: Mod assist Dynamic Sitting - Balance Activities: Reaching for objects Static Standing Balance Static Standing - Balance Support: Right upper extremity supported Static Standing - Level of Assistance: 3: Mod assist Dynamic Standing Balance Dynamic Standing - Balance Support: Right upper extremity supported Dynamic Standing - Level of Assistance: 2: Max assist Dynamic Standing - Balance Activities: Reaching for objects  Extremity Assessment        RLE Assessment RLE Assessment: Exceptions to [facility] General Strength Comments: gross 3+/5 LLE Assessment General Strength Comments: gross 3+/5 except DF 2/5    Care Tool Care Tool Bed Mobility Roll left and right activity   Roll left and right assist level: Moderate Assistance - Patient 50 - 74%    Sit to lying activity   Sit to lying  assist level: Moderate Assistance - Patient 50 - 74%    Lying to sitting on side of bed activity   Lying to sitting on side of bed assist level: the ability to move from lying on the back to sitting on the side of the bed with no back support.: Moderate Assistance - Patient 50 - 74%     Care Tool Transfers Sit to stand transfer   Sit to stand assist level: Maximal Assistance - Patient 25 - 49%    Chair/bed transfer   Chair/bed transfer assist level: Maximal Assistance - Patient 25 - 49%    Car transfer   Car transfer assist level: Maximal Assistance - Patient 25 - 49%      Care Tool Locomotion Ambulation Ambulation activity did not occur: Safety/medical concerns (2/2 strong posture deficit and fatigue)        Walk 10 feet activity Walk 10 feet activity did not occur: Safety/medical concerns (2/2 strong posture deficit and fatigue)       Walk 50 feet with 2 turns activity Walk 50 feet with 2 turns activity did not occur: Safety/medical concerns (2/2 strong posture deficit and fatigue)      Walk 150 feet activity Walk 150 feet activity did not occur: Safety/medical concerns (2/2 strong posture deficit and fatigue)      Walk 10 feet on uneven surfaces activity Walk 10 feet on uneven surfaces activity did not occur: Safety/medical concerns (2/2 strong posture deficit and fatigue)      Stairs Stair activity did not occur: Safety/medical concerns (2/2 strong posture deficit and fatigue)        Walk up/down 1 step activity Walk up/down 1 step or curb (drop down) activity did not occur: Safety/medical concerns (2/2 strong posture deficit and fatigue)      Walk up/down 4 steps activity Walk up/down 4 steps activity did not occur: Safety/medical concerns (2/2 strong posture deficit and fatigue)      Walk up/down 12 steps activity Walk up/down 12 steps activity did not occur: Safety/medical concerns (2/2 strong posture deficit and fatigue)      Pick up small objects from floor  Pick up small object from the floor (from standing position) activity did not occur: Safety/medical concerns (2/2 strong posture deficit and fatigue)      Wheelchair Is the patient using  a wheelchair?: Yes Type of Wheelchair: Manual   Wheelchair assist level: Dependent - Patient 0% Max wheelchair distance: 329ft  Wheel 50 feet with 2 turns activity   Assist Level: Dependent - Patient 0%  Wheel 150 feet activity   Assist Level: Dependent - Patient 0%    Refer to Care Plan for Long Term Goals  SHORT TERM GOAL WEEK 1 PT Short Term Goal 1 (Week 1): Pt will complete bed mobility with MinA PT Short Term Goal 2 (Week 1): Pt will complete SPT using MinA + LRAD PT Short Term Goal 3 (Week 1): Pt will complete 20 ft amb using modA + LRAD PT Short Term Goal 4 (Week 1): Pt will initiate stair training  Recommendations for other services: None   Skilled Therapeutic Intervention Mobility  Bed Mobility Bed Mobility: Supine to Sit;Sit to Supine Supine to Sit: Moderate Assistance - Patient 50-74% Sit to Supine: Moderate Assistance - Patient 50-74% Transfers Transfers: Sit to Stand;Stand to Sit;Stand Pivot Transfers Sit to Stand: Maximal Assistance - Patient 25-49% Stand to Sit: Moderate Assistance - Patient 50-74% Stand Pivot Transfers: Maximal Assistance - Patient 25 - 49% Transfer (Assistive device): None  Locomotion  Gait Ambulation: No Gait Gait: No Stairs / Additional Locomotion Stairs: No Wheelchair Mobility Wheelchair Mobility: No    Session 1: Chart reviewed and pt agreeable to therapy. Pt received seated in WC. Session focused on evaluation to establish therapy goals. Pt initiated session with evaluation as stated above. Pt then completed sit to stand practice using modA + RW to stand. Pt required strong VC for sequencing and safe use of DME in standing. Pt noted to have strong lack of posture control challenging initiation of gait. Session education emphasized therapy goals,  role of PT, care continuum, and CIR fall prevention. At end of session, pt was left seated in [facility] with alarm engaged, nurse call bell and all needs in reach.     Discharge Criteria: Patient will be discharged from PT if patient refuses treatment 3 consecutive times without medical reason, if treatment goals not met, if there is a change in medical status, if patient makes no progress towards goals or if patient is discharged from hospital.  The above assessment, treatment plan, treatment alternatives and goals were discussed and mutually agreed upon: by patient and by family  Jakwan Sally G Kidus Delman 08/04/2024, 3:50 PM

## 2024-08-04 NOTE — Progress Notes (Addendum)
 PROGRESS NOTE   Subjective/Complaints:  No events overnight.  No acute complaints. Some diastolic hypertension, but otherwise vitals are stable. A.m. labs consistent with 4 new leukocytosis from 6.9-16.4.  Neutrophilic.  Otherwise, stable.  Is on Decadron  every 8 hours as needed.  Afebrile.   P.o. intakes appropriate  Continent of bladder  Last BM 9/12  ROS: Denies fevers, chills, N/V, abdominal pain, constipation, diarrhea, SOB, cough, chest pain, new weakness or paraesthesias.    Objective:   No results found. Recent Labs    08/01/24 1501  WBC 16.4*  HGB 15.3  HCT 44.5  PLT 357   Recent Labs    08/01/24 1501  NA 135  K 4.1  CL 100  CO2 24  GLUCOSE 134*  BUN 21  CREATININE 1.00  CALCIUM 9.2    Intake/Output Summary (Last 24 hours) at 08/04/2024 1103 Last data filed at 08/04/2024 0814 Gross per 24 hour  Intake --  Output 1175 ml  Net -1175 ml        Physical Exam: Vital Signs Blood pressure (!) 136/95, pulse 61, temperature 97.8 F (36.6 C), temperature source Oral, resp. rate 19, height 6' 1 (1.854 m), weight 65.6 kg, SpO2 96%. Constitutional: No apparent distress. Appropriate appearance for age.  HENT: No JVD. Neck Supple. Trachea midline. Atraumatic, normocephalic. Eyes: PERRLA. EOMI. Visual fields grossly intact.  Cardiovascular: RRR, no murmurs/rub/gallops. No Edema. Peripheral pulses 2+  Respiratory: CTAB. No rales, rhonchi, or wheezing. On RA.  Abdomen: + bowel sounds, normoactive. No distention or tenderness.  GU: Not examined.  Skin: C/D/I. No apparent lesions.  Staples intact along.  Well-approximated.  Neurologic exam:    Pt alert and oriented x 3. Right gaze preference.   mild left central VII.  MMT: RUE and RLE grossly 5/5 prox to distal.  LUE 4 to 4+ delt, biceps, triceps, wrist and HI. LLE 4/5 HF, KE and 2-3/5 ADF/PF.  Sensation appears grossly intact to LT and pain in all 4's.  DTR's 1+.  No apparent ataxia Developing right torticollis/neck tone, unable to bring head to midline   Assessment/Plan: 1. Functional deficits which require 3+ hours per day of interdisciplinary therapy in a comprehensive inpatient rehab setting. Physiatrist is providing close team supervision and 24 hour management of active medical problems listed below. Physiatrist and rehab team continue to assess barriers to discharge/monitor patient progress toward functional and medical goals  Care Tool:  Bathing    Body parts bathed by patient: Left arm, Chest, Abdomen, Front perineal area, Buttocks, Right upper leg, Left upper leg, Face   Body parts bathed by helper: Right lower leg, Left lower leg Body parts n/a: Right arm   Bathing assist Assist Level: Maximal Assistance - Patient 24 - 49%     Upper Body Dressing/Undressing Upper body dressing   What is the patient wearing?: Pull over shirt    Upper body assist Assist Level: Maximal Assistance - Patient 25 - 49%    Lower Body Dressing/Undressing Lower body dressing      What is the patient wearing?: Underwear/pull up, Pants     Lower body assist Assist for lower body dressing: Total Assistance - Patient < 25%  Toileting Toileting    Toileting assist Assist for toileting: Maximal Assistance - Patient 25 - 49%     Transfers Chair/bed transfer  Transfers assist     Chair/bed transfer assist level: Maximal Assistance - Patient 25 - 49%     Locomotion Ambulation   Ambulation assist              Walk 10 feet activity   Assist           Walk 50 feet activity   Assist           Walk 150 feet activity   Assist           Walk 10 feet on uneven surface  activity   Assist           Wheelchair     Assist               Wheelchair 50 feet with 2 turns activity    Assist            Wheelchair 150 feet activity     Assist          Blood pressure (!)  136/95, pulse 61, temperature 97.8 F (36.6 C), temperature source Oral, resp. rate 19, height 6' 1 (1.854 m), weight 65.6 kg, SpO2 96%.  Medical Problem List and Plan: 1. 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  Antithrombotics: -DVT/anticoagulation:  Mechanical:  Antiembolism stockings, knee (TED hose) Bilateral lower extremities Sequential compression devices, below knee Bilateral lower extremities             -antiplatelet therapy: N/A 3. Pain Management: Tylenol , Fioricet as needed 4. Mood/Behavior/Sleep: LCSW to follow for evaluation and support when available.              -antipsychotic agents: N/A 5. Neuropsych/cognition: This patient is capable of making decisions on his own behalf. 6. Skin/Wound Care: Routine pressure-relief measures             -Per neurosurgery staple removal planned for next week.   7. Fluids/Electrolytes/Nutrition: Monitor intake and output and routine follow-up labs in a.m.              - SLP consult placed to screen swallow             - GERD: continue PPI 8.  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 Janjua on 9/12 decrease to 2 mg q8h indefinitely until radiation treatment begins 9.  Impaired glucose tolerance: Secondary to steroids CBGs have been below 180 has not required coverage. *Did not order SSI             - CBG monitoring was every 4 hours now 2x daily --has been stable, agree with hold SSI  10.  Constipation: pt had bm yesterday -senokot-s 1 tab at bedtime -miralax  prn              11.  Leukocytosis.  No fevers/chills/signs of sepsis, likely secondary to Decadron .  Will repeat CBC in a.m., check urine and chest  x-ray today just in case.  Low threshold for blood cultures and broad-spectrum antibiotics if he develops vital instability.  12.  Right neck tone/torticollis.  Start baclofen  5 mg 3 times daily.  Not currently bothersome, but want to prevent creasing and skin breakdown if possible.  LOS: 1 days A FACE TO FACE EVALUATION WAS PERFORMED  Joshua Harmon 08/04/2024, 11:03 AM

## 2024-08-05 DIAGNOSIS — D496 Neoplasm of unspecified behavior of brain: Secondary | ICD-10-CM | POA: Diagnosis not present

## 2024-08-05 LAB — CBC WITH DIFFERENTIAL/PLATELET
Abs Immature Granulocytes: 0.31 K/uL — ABNORMAL HIGH (ref 0.00–0.07)
Basophils Absolute: 0.1 K/uL (ref 0.0–0.1)
Basophils Relative: 0 %
Eosinophils Absolute: 0 K/uL (ref 0.0–0.5)
Eosinophils Relative: 0 %
HCT: 45.3 % (ref 39.0–52.0)
Hemoglobin: 15.2 g/dL (ref 13.0–17.0)
Immature Granulocytes: 2 %
Lymphocytes Relative: 14 %
Lymphs Abs: 2 K/uL (ref 0.7–4.0)
MCH: 30.6 pg (ref 26.0–34.0)
MCHC: 33.6 g/dL (ref 30.0–36.0)
MCV: 91.3 fL (ref 80.0–100.0)
Monocytes Absolute: 1.3 K/uL — ABNORMAL HIGH (ref 0.1–1.0)
Monocytes Relative: 9 %
Neutro Abs: 10.4 K/uL — ABNORMAL HIGH (ref 1.7–7.7)
Neutrophils Relative %: 75 %
Platelets: 360 K/uL (ref 150–400)
RBC: 4.96 MIL/uL (ref 4.22–5.81)
RDW: 13 % (ref 11.5–15.5)
WBC: 14.2 K/uL — ABNORMAL HIGH (ref 4.0–10.5)
nRBC: 0 % (ref 0.0–0.2)

## 2024-08-05 LAB — GLUCOSE, CAPILLARY
Glucose-Capillary: 105 mg/dL — ABNORMAL HIGH (ref 70–99)
Glucose-Capillary: 184 mg/dL — ABNORMAL HIGH (ref 70–99)

## 2024-08-05 MED ORDER — BACLOFEN 5 MG HALF TABLET: 5.0000 mg | ORAL_TABLET | Freq: Three times a day (TID) | ORAL | Status: DC | PRN

## 2024-08-05 NOTE — Progress Notes (Signed)
 PROGRESS NOTE   Subjective/Complaints:  No events overnight.  No acute complaints.  Seems to be attending to left side a little better today.  Asked for help with television for football game. Blood pressure looking better yesterday, other vitals are stable  ROS: Denies fevers, chills, N/V, abdominal pain, constipation, diarrhea, SOB, cough, chest pain, new weakness or paraesthesias.    Objective:   DG Chest 2 View Result Date: 08/04/2024 EXAM: 2 VIEW(S) XRAY OF THE CHEST 08/04/2024 12:15:00 PM COMPARISON: None available. CLINICAL HISTORY: Pt order states leukocytosis. FINDINGS: LUNGS AND PLEURA: No focal pulmonary opacity. No pulmonary edema. No pleural effusion. No pneumothorax. HEART AND MEDIASTINUM: No acute abnormality of the cardiac and mediastinal silhouettes. Hiatal hernia. BONES AND SOFT TISSUES: No acute osseous abnormality. IMPRESSION: 1. No acute process. 2. Hiatal hernia. Electronically signed by: Katheleen Faes MD 08/04/2024 04:02 PM EDT RP Workstation: [id number]   Recent Labs    08/05/24 0534  WBC 14.2*  HGB 15.2  HCT 45.3  PLT 360   No results for input(s): NA, K, CL, CO2, GLUCOSE, BUN, CREATININE, CALCIUM in the last 72 hours.   Intake/Output Summary (Last 24 hours) at 08/05/2024 1003 Last data filed at 08/05/2024 0900 Gross per 24 hour  Intake 708 ml  Output 350 ml  Net 358 ml        Physical Exam: Vital Signs Blood pressure 109/75, pulse 63, temperature 97.7 F (36.5 C), temperature source Oral, resp. rate 18, height 6' 1 (1.854 m), weight 65.6 kg, SpO2 95%. Constitutional: No apparent distress. Appropriate appearance for age.  Sitting upright in bed. HENT: No JVD. Neck Supple. Trachea midline. Atraumatic, normocephalic. Eyes: PERRLA. EOMI. Visual fields grossly intact.  Cardiovascular: RRR, no murmurs/rub/gallops. No Edema. Peripheral pulses 2+  Respiratory: CTAB. No rales, rhonchi,  or wheezing. On RA.  Abdomen: + bowel sounds, normoactive. No distention or tenderness.  GU: Not examined.  Skin: C/D/I. No apparent lesions.  Staples intact along.  Well-approximated.  Neurologic exam:    Pt alert and oriented x 3. Right gaze preference.   mild left central VII.  MMT: RUE and RLE grossly 5/5 prox to distal.  LUE 4 to 4+ delt, biceps, triceps, wrist and HI. LLE 4/5 HF, KE and 2-3/5 ADF/PF.  Sensation appears grossly intact to LT and pain in all 4's. DTR's 1+.  No apparent ataxia No apparent tone.  MSK: Positions neck preferentially turn and sidebent to the right, can bring head to midline better today 9-14.  No tenderness or palpable areas of tension, trigger points along right neck or trapezius.   Assessment/Plan: 1. Functional deficits which require 3+ hours per day of interdisciplinary therapy in a comprehensive inpatient rehab setting. Physiatrist is providing close team supervision and 24 hour management of active medical problems listed below. Physiatrist and rehab team continue to assess barriers to discharge/monitor patient progress toward functional and medical goals  Care Tool:  Bathing    Body parts bathed by patient: Left arm, Chest, Abdomen, Front perineal area, Buttocks, Right upper leg, Left upper leg, Face   Body parts bathed by helper: Right lower leg, Left lower leg Body parts n/a: Right arm   Bathing assist  Assist Level: Maximal Assistance - Patient 24 - 49%     Upper Body Dressing/Undressing Upper body dressing   What is the patient wearing?: Pull over shirt    Upper body assist Assist Level: Maximal Assistance - Patient 25 - 49%    Lower Body Dressing/Undressing Lower body dressing      What is the patient wearing?: Underwear/pull up, Pants     Lower body assist Assist for lower body dressing: Total Assistance - Patient < 25%     Toileting Toileting    Toileting assist Assist for toileting: Maximal Assistance - Patient 25 -  49%     Transfers Chair/bed transfer  Transfers assist     Chair/bed transfer assist level: Maximal Assistance - Patient 25 - 49%     Locomotion Ambulation   Ambulation assist   Ambulation activity did not occur: Safety/medical concerns (2/2 strong posture deficit and fatigue)          Walk 10 feet activity   Assist  Walk 10 feet activity did not occur: Safety/medical concerns (2/2 strong posture deficit and fatigue)        Walk 50 feet activity   Assist Walk 50 feet with 2 turns activity did not occur: Safety/medical concerns (2/2 strong posture deficit and fatigue)         Walk 150 feet activity   Assist Walk 150 feet activity did not occur: Safety/medical concerns (2/2 strong posture deficit and fatigue)         Walk 10 feet on uneven surface  activity   Assist Walk 10 feet on uneven surfaces activity did not occur: Safety/medical concerns (2/2 strong posture deficit and fatigue)         Wheelchair     Assist Is the patient using a wheelchair?: Yes Type of Wheelchair: Manual    Wheelchair assist level: Dependent - Patient 0% Max wheelchair distance: 354ft    Wheelchair 50 feet with 2 turns activity    Assist        Assist Level: Dependent - Patient 0%   Wheelchair 150 feet activity     Assist      Assist Level: Dependent - Patient 0%   Blood pressure 109/75, pulse 63, temperature 97.7 F (36.5 C), temperature source Oral, resp. rate 18, height 6' 1 (1.854 m), weight 65.6 kg, SpO2 95%.  Medical Problem List and Plan: 1. 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  Antithrombotics: -DVT/anticoagulation:  Mechanical:  Antiembolism stockings, knee (TED hose) Bilateral lower  extremities Sequential compression devices, below knee Bilateral lower extremities             -antiplatelet therapy: N/A 3. Pain Management: Tylenol , Fioricet as needed 4. Mood/Behavior/Sleep: LCSW to follow for evaluation and support when available.              -antipsychotic agents: N/A 5. Neuropsych/cognition: This patient is capable of making decisions on his own behalf. 6. Skin/Wound Care: Routine pressure-relief measures             -Per neurosurgery staple removal planned for next week.   7. Fluids/Electrolytes/Nutrition: Monitor intake and output and routine follow-up labs in a.m.              -  SLP consult placed to screen swallow             - GERD: continue PPI 8.  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 Janjua on 9/12 decrease to 2 mg q8h indefinitely until radiation treatment begins 9.  Impaired glucose tolerance: Secondary to steroids CBGs have been below 180 has not required coverage. *Did not order SSI             - CBG monitoring was every 4 hours now 2x daily --has been stable, agree with hold SSI  10.  Constipation: pt had bm yesterday -senokot-s 1 tab at bedtime -miralax  prn             Last bowel movement 9-14, medium  11.  Leukocytosis.  likely secondary to Decadron . check urine and chest x-ray today to screen for secondary causes.  Low threshold for blood cultures and broad-spectrum antibiotics if he develops vital instability.  - 9-14: Urinalysis clear, chest x-ray with no acute process.  Vitally stable, afebrile.  Trend  12.  Right neck tone/torticollis?.  Start baclofen  5 mg 3 times daily.  Not currently bothersome, but want to prevent creasing and skin breakdown if possible.  - 9-14: Head positioning much better today, likely more related to hemineglect.  Move baclofen  to 3 times daily as needed.  LOS: 2 days A FACE TO FACE EVALUATION WAS  PERFORMED  Joshua Harmon Likes 08/05/2024, 10:03 AM

## 2024-08-05 NOTE — Plan of Care (Signed)
  Problem: Consults Goal: RH GENERAL PATIENT EDUCATION Description: See Patient Education module for education specifics. Outcome: Progressing   Problem: RH BOWEL ELIMINATION Goal: RH STG MANAGE BOWEL WITH ASSISTANCE Description: STG Manage Bowel with supervision-min  Assistance. Outcome: Progressing   Problem: RH BLADDER ELIMINATION Goal: RH STG MANAGE BLADDER WITH ASSISTANCE Description: STG Manage Bladder With supervision-min Assistance Outcome: Progressing   Problem: RH SKIN INTEGRITY Goal: RH STG SKIN FREE OF INFECTION/BREAKDOWN Description: Manage skin free of infection with supervision- min assistance  Outcome: Progressing   Problem: RH SAFETY Goal: RH STG ADHERE TO SAFETY PRECAUTIONS W/ASSISTANCE/DEVICE Description: STG Adhere to Safety Precautions With supervision-min Assistance/Device. Outcome: Progressing   Problem: RH PAIN MANAGEMENT Goal: RH STG PAIN MANAGED AT OR BELOW PT'S PAIN GOAL Description: < 4 w/ prns Outcome: Progressing   Problem: RH KNOWLEDGE DEFICIT GENERAL Goal: RH STG INCREASE KNOWLEDGE OF SELF CARE AFTER HOSPITALIZATION Description: Manage increase knowledge of self care after hospitalization with supervision- min assistance from partner using educational materials provided Outcome: Progressing

## 2024-08-06 DIAGNOSIS — D496 Neoplasm of unspecified behavior of brain: Secondary | ICD-10-CM | POA: Diagnosis not present

## 2024-08-06 DIAGNOSIS — R414 Neurologic neglect syndrome: Secondary | ICD-10-CM | POA: Diagnosis not present

## 2024-08-06 DIAGNOSIS — K5901 Slow transit constipation: Secondary | ICD-10-CM | POA: Diagnosis not present

## 2024-08-06 LAB — COMPREHENSIVE METABOLIC PANEL WITH GFR
ALT: 80 U/L — ABNORMAL HIGH (ref 0–44)
AST: 54 U/L — ABNORMAL HIGH (ref 15–41)
Albumin: 2.8 g/dL — ABNORMAL LOW (ref 3.5–5.0)
Alkaline Phosphatase: 54 U/L (ref 38–126)
Anion gap: 11 (ref 5–15)
BUN: 16 mg/dL (ref 8–23)
CO2: 25 mmol/L (ref 22–32)
Calcium: 9 mg/dL (ref 8.9–10.3)
Chloride: 98 mmol/L (ref 98–111)
Creatinine, Ser: 0.9 mg/dL (ref 0.61–1.24)
GFR, Estimated: >60 mL/min (ref >60)
Glucose, Bld: 123 mg/dL — ABNORMAL HIGH (ref 70–99)
Potassium: 4.4 mmol/L (ref 3.5–5.1)
Sodium: 134 mmol/L — ABNORMAL LOW (ref 135–145)
Total Bilirubin: 0.5 mg/dL (ref 0.0–1.2)
Total Protein: 5.9 g/dL — ABNORMAL LOW (ref 6.5–8.1)

## 2024-08-06 LAB — CBC WITH DIFFERENTIAL/PLATELET
Abs Immature Granulocytes: 0.59 K/uL — ABNORMAL HIGH (ref 0.00–0.07)
Basophils Absolute: 0.1 K/uL (ref 0.0–0.1)
Basophils Relative: 0 %
Eosinophils Absolute: 0 K/uL (ref 0.0–0.5)
Eosinophils Relative: 0 %
HCT: 43.8 % (ref 39.0–52.0)
Hemoglobin: 15 g/dL (ref 13.0–17.0)
Immature Granulocytes: 4 %
Lymphocytes Relative: 15 %
Lymphs Abs: 2.2 K/uL (ref 0.7–4.0)
MCH: 31.2 pg (ref 26.0–34.0)
MCHC: 34.2 g/dL (ref 30.0–36.0)
MCV: 91.1 fL (ref 80.0–100.0)
Monocytes Absolute: 1.4 K/uL — ABNORMAL HIGH (ref 0.1–1.0)
Monocytes Relative: 9 %
Neutro Abs: 10.8 K/uL — ABNORMAL HIGH (ref 1.7–7.7)
Neutrophils Relative %: 72 %
Platelets: 379 K/uL (ref 150–400)
RBC: 4.81 MIL/uL (ref 4.22–5.81)
RDW: 12.8 % (ref 11.5–15.5)
WBC: 15 K/uL — ABNORMAL HIGH (ref 4.0–10.5)
nRBC: 0.1 % (ref 0.0–0.2)

## 2024-08-06 LAB — GLUCOSE, CAPILLARY
Glucose-Capillary: 151 mg/dL — ABNORMAL HIGH (ref 70–99)
Glucose-Capillary: 151 mg/dL — ABNORMAL HIGH (ref 70–99)

## 2024-08-06 MED ORDER — LORAZEPAM 0.5 MG PO TABS
1.0000 mg | ORAL_TABLET | Freq: Once | ORAL | Status: AC
Start: 2024-08-06 — End: 2024-08-06
  Administered 2024-08-06: 1 mg via ORAL
  Filled 2024-08-06: qty 2

## 2024-08-06 NOTE — IPOC Note (Signed)
 Overall Plan of Care [facility]) Patient Details Name: Joshua Harmon MRN: [id number] DOB: [birth year]  Admitting Diagnosis: Brain tumor [facility])  Hospital Problems: Principal Problem:   Brain tumor [facility])     Functional Problem List: Nursing Bladder, Bowel, Edema, Endurance, Medication Management, Safety  PT Balance, Behavior, Endurance, Motor, Perception, Pain, Safety, Sensory, Edema  OT Balance, Cognition, Endurance, Motor, Perception, Safety, Sensory, Vision  SLP Cognition  TR         Basic ADL's: OT Toileting, Dressing, Bathing, Grooming, Eating     Advanced  ADL's: OT None     Transfers: PT Bed Mobility, Car, Bed to Chair  OT Toilet, Tub/Shower     Locomotion: PT Ambulation, Stairs     Additional Impairments: OT Fuctional Use of Upper Extremity  SLP Social Cognition   Problem Solving, Memory, Attention, Awareness  TR      Anticipated Outcomes Item Anticipated Outcome  Self Feeding mod I  Swallowing      Basic self-care  (S)  Toileting  (S)   Bathroom Transfers CGA  Bowel/Bladder  manage bowels with medications/ manage bladder with toileting assistance  Transfers  CGA + LRAD  Locomotion  CGA + LRAD  Communication     Cognition  supervisionA  Pain  <4 w/ prns  Safety/Judgment  mnage safety w/ supervision-min assistance   Therapy Plan: PT Intensity: Minimum of 1-2 x/day ,45 to 90 minutes PT Frequency: 5 out of 7 days PT Duration Estimated Length of Stay: 21-24 days OT Intensity: Minimum of 1-2 x/day, 45 to 90 minutes OT Frequency: 5 out of 7 days OT Duration/Estimated Length of Stay: 3-4 weeks SLP Intensity: Minumum of 1-2 x/day, 30 to 90 minutes SLP Frequency: 3 to 5 out of 7 days SLP Duration/Estimated Length of Stay: 3 weeks   Team Interventions: Nursing Interventions Patient/Family Education, Bladder Management, Bowel Management, Disease Management/Prevention, Pain Management, Discharge Planning  PT interventions Ambulation/gait training,  Functional mobility training, Therapeutic Activities, Balance/vestibular training, Neuromuscular re-education, Therapeutic Exercise, Cognitive remediation/compensation, Pain management, UE/LE Strength taining/ROM, Community reintegration, Patient/family education, UE/LE Coordination activities, Discharge planning, Psychosocial support, Visual/perceptual remediation/compensation, Disease management/prevention, Skin care/wound management, Wheelchair propulsion/positioning, Fish farm manager, Splinting/orthotics, Functional electrical stimulation, Stair training  OT Interventions Fish farm manager, Warden/ranger, Patient/family education, Therapeutic Activities, Cognitive remediation/compensation, Functional electrical stimulation, Wheelchair propulsion/positioning, Psychosocial support, Therapeutic Exercise, Functional mobility training, Community reintegration, Self Care/advanced ADL retraining, UE/LE Strength taining/ROM, Discharge planning, Neuromuscular re-education, Skin care/wound managment, UE/LE Coordination activities, Disease mangement/prevention, Pain management, Visual/perceptual remediation/compensation  SLP Interventions Cognitive remediation/compensation, Cueing hierarchy, Functional tasks, Patient/family education, Speech/Language facilitation, Therapeutic Exercise, Environmental controls  TR Interventions    SW/CM Interventions Discharge Planning, Psychosocial Support, Patient/Family Education   Barriers to Discharge MD  Medical stability  Nursing Decreased caregiver support, Home environment access/layout Discharge: House  Discharge Home Layout: One level  Discharge Home Access: Stairs to enter  Entrance Stairs-Number of Steps: 4  PT Home environment access/layout 3 STE with no rails. No plans to add rails per pt report  OT      SLP Other (comments) awareness of deficits, severity of deficits  SW Decreased caregiver support, Lack  of/limited family support, Insurance for SNF coverage     Team Discharge Planning: Destination: PT-Home ,OT- Home , SLP-Home Projected Follow-up: PT-Outpatient PT, OT-  Home health OT, SLP-24 hour supervision/assistance, Outpatient SLP Projected Equipment Needs: PT-To be determined, OT- To be determined, SLP-None recommended by SLP Equipment Details: PT- , OT-  Patient/family involved in discharge planning: PT- Patient,  OT-Patient, SLP-Patient  MD ELOS: 21-24 days Medical Rehab Prognosis:  Excellent Assessment: The patient has been admitted for CIR therapies with the diagnosis of high grade glioma s/p stereotactic biopsy/resxn. The team will be addressing functional mobility, strength, stamina, balance, safety, adaptive techniques and equipment, self-care, bowel and bladder mgt, patient and caregiver education, NMR, visual-spatial awareness, cognition, community reentry. Goals have been set at supervision to contact guard assist with mobility, self-care, and cognition. Anticipated discharge destination is home with wife.        See Team Conference Notes for weekly updates to the plan of care

## 2024-08-06 NOTE — Progress Notes (Signed)
 PROGRESS NOTE   Subjective/Complaints:  No new complaints. Had early morning therapy. Feels that he's getting stronger. Minimal headache  ROS: Patient denies fever, rash, sore throat, blurred vision, dizziness, nausea, vomiting, diarrhea, cough, shortness of breath or chest pain, joint or back/neck pain,   or mood change.   Objective:   DG Chest 2 View Result Date: 08/04/2024 EXAM: 2 VIEW(S) XRAY OF THE CHEST 08/04/2024 12:15:00 PM COMPARISON: None available. CLINICAL HISTORY: Pt order states leukocytosis. FINDINGS: LUNGS AND PLEURA: No focal pulmonary opacity. No pulmonary edema. No pleural effusion. No pneumothorax. HEART AND MEDIASTINUM: No acute abnormality of the cardiac and mediastinal silhouettes. Hiatal hernia. BONES AND SOFT TISSUES: No acute osseous abnormality. IMPRESSION: 1. No acute process. 2. Hiatal hernia. Electronically signed by: Katheleen Faes MD 08/04/2024 04:02 PM EDT RP Workstation: [id number]   Recent Labs    08/05/24 0534 08/06/24 0555  WBC 14.2* 15.0*  HGB 15.2 15.0  HCT 45.3 43.8  PLT 360 379   Recent Labs    08/06/24 0555  NA 134*  K 4.4  CL 98  CO2 25  GLUCOSE 123*  BUN 16  CREATININE 0.90  CALCIUM 9.0     Intake/Output Summary (Last 24 hours) at 08/06/2024 1058 Last data filed at 08/06/2024 0446 Gross per 24 hour  Intake 472 ml  Output 1050 ml  Net -578 ml        Physical Exam: Vital Signs Blood pressure 115/89, pulse 71, temperature 98 F (36.7 C), resp. rate 14, height 6' 1 (1.854 m), weight 65.6 kg, SpO2 95%. Constitutional: No distress . Vital signs reviewed. HEENT: right crani site CDI with staples.  EOMI, oral membranes moist Neck: supple Cardiovascular: RRR without murmur. No JVD    Respiratory/Chest: CTA Bilaterally without wheezes or rales. Normal effort    GI/Abdomen: BS +, non-tender, non-distended Ext: no clubbing, cyanosis, or edema Psych: pleasant and cooperative   Skin: C/D/I. No apparent lesions.  Staples intact along.  Well-approximated.  Neurologic exam:    Pt alert and oriented x 3. Right gaze preference.   mild left central VII.  MMT: RUE and RLE grossly 5/5 prox to distal.  LUE 4 to 4+ delt, biceps, triceps, wrist and HI. LLE 4/5 HF, KE and 2-3/5 ADF/PF.  Fairly stable. Has activation issues at times d/t neglect. Sensation appears grossly intact to LT and pain in all 4's. DTR's 1+.  No apparent ataxia No apparent resting tone. MSK: Neck fairly neutral today. Turns head sl to right d/t left inattention.  Assessment/Plan: 1. Functional deficits which require 3+ hours per day of interdisciplinary therapy in a comprehensive inpatient rehab setting. Physiatrist is providing close team supervision and 24 hour management of active medical problems listed below. Physiatrist and rehab team continue to assess barriers to discharge/monitor patient progress toward functional and medical goals  Care Tool:  Bathing    Body parts bathed by patient: Left arm, Chest, Abdomen, Front perineal area, Buttocks, Right upper leg, Left upper leg, Face   Body parts bathed by helper: Right lower leg, Left lower leg Body parts n/a: Right arm   Bathing assist Assist Level: Maximal Assistance - Patient 24 - 49%  Upper Body Dressing/Undressing Upper body dressing   What is the patient wearing?: Pull over shirt    Upper body assist Assist Level: Maximal Assistance - Patient 25 - 49%    Lower Body Dressing/Undressing Lower body dressing      What is the patient wearing?: Underwear/pull up, Pants     Lower body assist Assist for lower body dressing: Total Assistance - Patient < 25%     Toileting Toileting    Toileting assist Assist for toileting: Maximal Assistance - Patient 25 - 49%     Transfers Chair/bed transfer  Transfers assist     Chair/bed transfer assist level: Maximal Assistance - Patient 25 - 49%      Locomotion Ambulation   Ambulation assist   Ambulation activity did not occur: Safety/medical concerns (2/2 strong posture deficit and fatigue)          Walk 10 feet activity   Assist  Walk 10 feet activity did not occur: Safety/medical concerns (2/2 strong posture deficit and fatigue)        Walk 50 feet activity   Assist Walk 50 feet with 2 turns activity did not occur: Safety/medical concerns (2/2 strong posture deficit and fatigue)         Walk 150 feet activity   Assist Walk 150 feet activity did not occur: Safety/medical concerns (2/2 strong posture deficit and fatigue)         Walk 10 feet on uneven surface  activity   Assist Walk 10 feet on uneven surfaces activity did not occur: Safety/medical concerns (2/2 strong posture deficit and fatigue)         Wheelchair     Assist Is the patient using a wheelchair?: Yes Type of Wheelchair: Manual    Wheelchair assist level: Dependent - Patient 0% Max wheelchair distance: 329ft    Wheelchair 50 feet with 2 turns activity    Assist        Assist Level: Dependent - Patient 0%   Wheelchair 150 feet activity     Assist      Assist Level: Dependent - Patient 0%   Blood pressure 115/89, pulse 71, temperature 98 F (36.7 C), resp. rate 14, height 6' 1 (1.854 m), weight 65.6 kg, SpO2 95%.  Medical Problem List and Plan: 1. 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  Antithrombotics: -DVT/anticoagulation:  Mechanical:  Antiembolism stockings, knee (TED hose) Bilateral lower extremities Sequential compression devices, below knee Bilateral lower extremities             -antiplatelet therapy: N/A 3. Pain Management: Tylenol , Fioricet as  needed 4. Mood/Behavior/Sleep: LCSW to follow for evaluation and support when available.              -antipsychotic agents: N/A 5. Neuropsych/cognition: This patient is capable of making decisions on his own behalf. 6. Skin/Wound Care: Routine pressure-relief measures             -Per neurosurgery staple removal planned for this week.   7. Fluids/Electrolytes/Nutrition: Monitor intake and output and routine follow-up labs in a.m.              - on  regular/ thins diet             - GERD: continue PPI 8.  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 Janjua on 9/12 decrease to 2 mg q8h indefinitely until radiation treatment begins 9.  Impaired glucose tolerance: Secondary to steroids CBGs have been below 180 has not required coverage. *Did not order SSI             - CBG monitoring was every 4 hours now 2x daily --has been stable, continue to hold SSI  10.  Constipation: pt had bm yesterday -senokot-s 1 tab at bedtime -miralax  prn             Last bowel movement 9-14, medium  11.  Leukocytosis.  likely secondary to Decadron . check urine and chest x-ray today to screen for secondary causes.  Low threshold for blood cultures and broad-spectrum antibiotics if he develops vital instability.  - 9-14: Urinalysis clear, chest x-ray with no acute process.  Vitally stable, afebrile.  Trend  9/15 sl increase to 15k today. Pt afebrile, no s/s infection. UA/CXR as above 12.  Right neck tone/torticollis?.  Start baclofen  5 mg 3 times daily.  Not currently bothersome, but want to prevent creasing and skin breakdown if possible.  - 9-14: Head positioning much better today, likely more related to hemineglect.  Move baclofen  to 3 times daily as needed.  9/15 pt fairly neutral today--left inattention is biggest factor LOS: 3 days A FACE TO FACE EVALUATION WAS PERFORMED  Arthea ONEIDA Gunther 08/06/2024, 10:58  AM

## 2024-08-06 NOTE — Progress Notes (Signed)
 Occupational Therapy Session Note  Patient Details  Name: Joshua Harmon MRN: [id number] Date of Birth: [birth year]  Today's Date: 08/06/2024 OT Individual Time: 0930-1030 OT Individual Time Calculation (min): 60 min    Short Term Goals: Week 1:  OT Short Term Goal 1 (Week 1): Pt will don shirt with mod A OT Short Term Goal 2 (Week 1): Pt will complete a stand pivot transfer to the w/c with mod A OT Short Term Goal 3 (Week 1): Pt will improve LUE ataxia by using the LUE during bathing with min A OT Short Term Goal 4 (Week 1): Pt will don pants with mod A OT Short Term Goal 5 (Week 1): Pt will right trunk and head in sitting with no more than mod facilitation  Skilled Therapeutic Interventions/Progress Updates:    Pt received sitting up with no c/o pain, agreeable to OT session. R head turn improved at rest. [name] was present- provided edu on OT POC, ELOS, and goals. Pt wanting to take shower. Stand pivot from w/c to the [facility] in the shower toward the L with mod A for managing trunk, placement of the LUE/LLE, and mod skilled cueing for pt sequencing/motor planning. He stood from [facility] to doff pants with mod A. He required manual facilitation to manage the LLE. He completed UB bathing with min cueing for L UE inclusion and min A overall. Mod cueing for sequencing reaching around the [facility] to wash his bottom in sitting, CGA for balance. Min A to wash feet. He transferred back to the w/c, mod A stand pivot. He was challenged to use the LUE functionally with min facilitation from OT and frequent cueing for visual attention during oral care and applying deodorant. He has overall very poor visual attention to the L and is only able to maintain visual attention for ~5-10 seconds at a time. He donned a shirt with cueing for hemi technique and mod A overall to thread over LUE and pull over head. Max A to don pants- to thread over BLE and pull up in standing. Pt was taken via w/c to the ADL apt. He  worked on standing level functional reaching activity at the counter to address midline orientation of trunk and head, LUE NMR, and L attention. He required mod A at the trunk and head with heavy cueing and facilitation to find and maintain midline. R head turn becoming more prominent in standing. He was able to use his LUE to reach for items placed on the L side of the cabinet at 90 degrees and bring them to the next shelf up at 120 degrees shoulder flexion with min facilitation at the shoulder. Max cueing for visual attention to the LUE. His LUE has full grasp/release functionally when he is visually attending to it 2/2 severe ataxia. He returned to his room following. Pt was left sitting up in the wheelchair with all needs met, chair alarm set, and call bell within reach.    Therapy Documentation Precautions:  Precautions Precautions: Fall Recall of Precautions/Restrictions: Impaired Precaution/Restrictions Comments: Poor awareness, L lean, ataxic Restrictions Weight Bearing Restrictions Per Provider Order: No  Therapy/Group: Individual Therapy  Nena VEAR Moats 08/06/2024, 8:48 AM

## 2024-08-06 NOTE — Progress Notes (Signed)
 Speech Language Pathology Daily Session Note  Patient Details  Name: JAIDEV SANGER MRN: [id number] Date of Birth: 1961-02-24  Today's Date: 08/06/2024 SLP Individual Time: 1100-1200 SLP Individual Time Calculation (min): 60 min  Short Term Goals: Week 1: SLP Short Term Goal 1 (Week 1): Pt will utilize compensatory memory strategies as needed to recall recent/relevant info w/ minA SLP Short Term Goal 2 (Week 1): Pt will sustain attention to task in a mildly distracting environment for ~10 mins given minA SLP Short Term Goal 3 (Week 1): Pt will solve functional problems w/ minA SLP Short Term Goal 4 (Week 1): Pt will improve L visual attention to minA SLP Short Term Goal 5 (Week 1): Pt will recall 2 changes in cognition given modA  Skilled Therapeutic Interventions:   Pt greeted at bedside. He was up in his [facility] upon SLP arrival and very agreeable to tx tasks targeting cognition. He benefited from minA cues to utilize his calendar and ID current date/dow. He was challenged to remember this, however, continued to perseverate on 9/25 given L visual attention deficits and today's date placed to the L of midline. He benefited from modA cues to recall the date after 10 and additional 15 min delay. Between recall times, he completed a verbal time management task. He benefited from [employer] for working [employer], information processing, and problem solving throughout. He also completed a written money management task and required maxA for L visual attention, organization, and sustained attention. Of note, pt presented w/ increased fatigue this date. He was able to remain awake, however, did stop himself from nodding off intermittently. Also benefited from [facility] cues during L visual attention task matching designs. At the end of tx tasks, pt's SO arrived. SLP provided a brief update and additional questions were answered. At the end of tx tasks, he was left in his [facility] w/ the alarm set and call light  within reach. Recommend cont ST per POC.   Pain  No pain reported  Therapy/Group: Individual Therapy  [name] 08/06/2024, 12:45 PM

## 2024-08-06 NOTE — Plan of Care (Signed)
  Problem: RH BOWEL ELIMINATION Goal: RH STG MANAGE BOWEL WITH ASSISTANCE Description: STG Manage Bowel with supervision-min  Assistance. Outcome: Progressing   Problem: RH BLADDER ELIMINATION Goal: RH STG MANAGE BLADDER WITH ASSISTANCE Description: STG Manage Bladder With supervision-min Assistance Outcome: Progressing   Problem: RH SKIN INTEGRITY Goal: RH STG SKIN FREE OF INFECTION/BREAKDOWN Description: Manage skin free of infection with supervision- min assistance  Outcome: Progressing   Problem: RH SAFETY Goal: RH STG ADHERE TO SAFETY PRECAUTIONS W/ASSISTANCE/DEVICE Description: STG Adhere to Safety Precautions With supervision-min Assistance/Device. Outcome: Progressing   Problem: RH PAIN MANAGEMENT Goal: RH STG PAIN MANAGED AT OR BELOW PT'S PAIN GOAL Description: < 4 w/ prns Outcome: Progressing

## 2024-08-06 NOTE — Progress Notes (Signed)
 Met with patient to review current situation, team conference and plan of  care. Patient knows where he is but kept on saying he needs to get out to check his car. Per nurse patient keeps trying to get OOB last night and keeps getting at the edge of the chair. Patient on tilt n space chair. Patient repositioned with nurse and seat belt placed. Telesitter ordered for safety. Will come back when family is here to discuss plan of care. Continue to follow along to provide educational needs to facilitate preparation for discharge.

## 2024-08-06 NOTE — Progress Notes (Signed)
 Inpatient Rehabilitation Care Coordinator Assessment and Plan Patient Details  Name: Joshua Harmon MRN: [id number] Date of Birth: [birth year]  Today's Date: 08/06/2024  Hospital Problems: Principal Problem:   Brain tumor [facility])  Past Medical History:  Past Medical History:  Diagnosis Date   GERD (gastroesophageal reflux disease)    Past Surgical History:  Past Surgical History:  Procedure Laterality Date   APPLICATION OF CRANIAL NAVIGATION Right 07/30/2024   Procedure: COMPUTER-ASSISTED NAVIGATION, FOR CRANIAL PROCEDURE;  Surgeon: Rosslyn Dino HERO, MD;  Location: [facility] OR;  Service: Neurosurgery;  Laterality: Right;  CRANIAL NAVIGATION   LEG SURGERY Bilateral as a child   STERIOTACTIC STIMULATOR INSERTION Right 07/30/2024   Procedure: RIGHT STEREOTACTIC BIOPSY;  Surgeon: Rosslyn Dino HERO, MD;  Location: [facility] OR;  Service: Neurosurgery;  Laterality: Right;  RIGHT STERIOTACTIC BRAIN BIOPSY   XI ROBOTIC ASSISTED INGUINAL HERNIA REPAIR WITH MESH Right 02/19/2020   Procedure: XI ROBOTIC ASSISTED Right INGUINAL HERNIA REPAIR WITH MESH;  Surgeon: Jordis Laneta FALCON, MD;  Location: [facility] ORS;  Service: General;  Laterality: Right;   Social History:  reports that he has been smoking cigarettes. He has never used smokeless tobacco. He reports that he does not currently use alcohol after a past usage of about 10.0 standard drinks of alcohol per week. He reports that he does not currently use drugs.  Family / Support Systems Marital Status: Divorced How Long?: 3 years Patient Roles: Partner, Parent Spouse/Significant Other: [name] (s/o) [phone] Children: 2 children- son and dtr both 64 yrs old; 62 years old; six months apart. Other Supports: None Anticipated Caregiver: S/o [name] Ability/Limitations of Caregiver: Pt s/o [name] will provide 24/7 care. Caregiver Availability: 24/7 Family Dynamics: Pt lives with his s/o [name] her dtr and granddaughter.  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 Education: some college Health Literacy - How often do you need to have someone help you when you read instructions, pamphlets, or other written material from your doctor or pharmacy?: Never Writes: Yes Employment Status: Disabled Date Retired/Disabled/Unemployed: June 2025 Legal History/Current Legal Issues: Denies Guardian/Conservator: N/A   Abuse/Neglect Abuse/Neglect Assessment Can Be Completed: Yes Physical Abuse: Denies Verbal Abuse: Denies Sexual Abuse: Denies Exploitation of patient/patient's resources: Denies Self-Neglect: Denies  Patient response to: Social Isolation - How often do you feel lonely or isolated from those around you?: Never  Emotional Status Pt's affect, behavior and adjustment status: Pt presents with labile mood at time of visit. Recent Psychosocial Issues: Karna Psychiatric History: Denies Substance Abuse History: Pt staets he quit at time of admission; was smoking .5 to 11ppd; occassional etoh use; quit marijuana/crack one year ago.  Patient / Family Perceptions, Expectations & Goals Pt/Family understanding of illness & functional limitations: Pt and s/o have a general understsanding of care needs Premorbid pt/family roles/activities: Independent Anticipated changes in roles/activities/participation: Assistance with ADLs/IADLs Pt/family expectations/goals: Pt gola is to guie me the right way, Has a desire to walk again and drive soon as well.  Community Resources [employer]: None Premorbid Home Care/DME Agencies: None Transportation available at discharge: s/o [name] Is the patient able to respond to transportation needs?: Yes In the past 12 months, has lack of transportation kept you from medical appointments or from getting medications?: No In the past 12 months, has lack of transportation kept  you from meetings, work, or from getting things needed for daily living?: No Resource referrals recommended:  Neuropsychology  Discharge Planning Living Arrangements: Spouse/significant other Support Systems: Spouse/significant other Type of Residence: Private residence Insurance Resources: Media planner (specify) Herbalist) Financial Resources: Family Support Financial Screen Referred: No Living Expenses: Banker Management: Significant Other Does the patient have any problems obtaining your medications?: No Home Management: Pt s/o [name] manages all homecare needs Patient/Family Preliminary Plans: No changes Care Coordinator Barriers to Discharge: Decreased caregiver support, Lack of/limited family support, Insurance for SNF coverage Care Coordinator Anticipated Follow Up Needs: HH/OP Expected length of stay: 21-24 days  Clinical Impression SW met with pt and ot s/o [name] to introduce self, explain role, discuss discharge process, and inform on ELOS. He is not a Cytogeneticist. No HCPOA but would like to complete. SW will discuss with team.  SW emailed fiancnail navigators abotu Medicaid application and waiting onf ollow-up if they are able to see him.  Shylyn Younce A Korby Ratay 08/06/2024, 12:53 PM

## 2024-08-06 NOTE — Progress Notes (Signed)
 Inpatient Rehabilitation  Patient information reviewed and entered into eRehab system by [facility]. Karen Kays., CCC/SLP, PPS Coordinator.  Information including medical coding, functional ability and quality indicators will be reviewed and updated through discharge.

## 2024-08-06 NOTE — Care Management (Signed)
 Inpatient Rehabilitation Center Individual Statement of Services  Patient Name:  [name]  Date:  08/06/2024  Welcome to the Inpatient Rehabilitation Center.  Our goal is to provide you with an individualized program based on your diagnosis and situation, designed to meet your specific needs.  With this comprehensive rehabilitation program, you will be expected to participate in at least 3 hours of rehabilitation therapies Monday-Friday, with modified therapy programming on the weekends.  Your rehabilitation program will include the following services:  Physical Therapy (PT), Occupational Therapy (OT), Speech Therapy (ST), 24 hour per day rehabilitation nursing, Therapeutic Recreaction (TR), Psychology, Neuropsychology, Care Coordinator, Rehabilitation Medicine, Nutrition Services, Pharmacy Services, and Other  Weekly team conferences will be held on Tuesday to discuss your progress.  Your Inpatient Rehabilitation Care Coordinator will talk with you frequently to get your input and to update you on team discussions.  Team conferences with you and your family in attendance may also be held.  Expected length of stay: 21-24 days    Overall anticipated outcome: Contact Guard  Depending on your progress and recovery, your program may change. Your Inpatient Rehabilitation Care Coordinator will coordinate services and will keep you informed of any changes. Your Inpatient Rehabilitation Care Coordinator's name and contact numbers are listed  below.  The following services may also be recommended but are not provided by the Inpatient Rehabilitation Center:  Driving Evaluations Home Health Rehabiltiation Services Outpatient Rehabilitation Services Vocational Rehabilitation   Arrangements will be made to provide these services after discharge if needed.  Arrangements include referral to agencies that provide these services.  Your insurance has been verified to be:  BCBS  Your primary doctor  is:  No PCP listed  Pertinent information will be shared with your doctor and your insurance company.  Inpatient Rehabilitation Care Coordinator:  Joshua Harmon, KEN [phone] or [phone]  Information discussed with and copy given to patient by: Joshua Harmon, 08/06/2024, 9:47 AM

## 2024-08-06 NOTE — Progress Notes (Signed)
 PMR Admission Coordinator Pre-Admission Assessment   Patient: Joshua Harmon is an 63 y.o., male MRN: [id number] DOB: 1960-12-16 Height: 6' 1 (185.4 cm) Weight: 67.1 kg   Insurance Information HMO:     PPO: Yes     PCP:       IPA:       80/20:       OTHER:  70/30 PRIMARY: BCBS COMM PPO      Policy#: [id number]      Subscriber: self CM Name: Joshua Harmon    Phone#: [phone]     Fax#: [phone] Pre-Cert#: [id number] from [facility] for admission 08/03/24 to 08/16/24 with updates due on 08/16/24 to fax [phone]      Employer: [employer] Benefits:  Phone #: [phone]   Name: Verified Eff. Date: 11/23/23     Deduct: $2000 (met 1479.19)      Out of Pocket Max: [phone] (met $1982.18      Life Max: n/a CIR: 70%      SNF: 70% Outpatient: limit 30 visits / yr     Co-Pay: $25/visit Home Health: 70%      Co-Pay: 30% DME: 70%     Co-Pay: 30% Providers: in network  SECONDARY:       Policy#:      Phone#:    Artist:       Phone#:    The Data processing manager" for patients in Inpatient Rehabilitation Facilities with attached "Privacy Act Statement-Health Care Records" was provided and verbally reviewed with: Patient   Emergency Contact Information Contact Information       Name Relation Home Work Mobile    Joshua Harmon Significant other     [phone]         Other Contacts       Name Relation Home Work Mobile    Joshua Harmon Sister     [phone]    Joshua Harmon     [phone]           Current Medical History  Patient Admitting Diagnosis: Brain tumor/High grade glioma   History of Present Illness: A 63 y.o. 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  This has been ongoing for several weeks.  Worse over the last 1 week.  Denies any headache nausea vomiting.  No weight loss.   No shortness of breath chest pain.  He has been holding onto the furniture in his house to get around.  Has not had any significant falls recently.  [facility] was [facility] where he was found to have brain mass.  Patient was subsequently transferred to [facility] and admitted on 07/26/24 for neurosurgical input.  He underwent a brain biopsy on 07/30/24 by Dr. Rosslyn.  PT/OT/SLP evaluations were completed with recommendations for acute inpatient rehab admission.   Patient's medical record from [facility] has been reviewed by the rehabilitation admission coordinator and physician.   Past Medical History      Past Medical History:  Diagnosis Date   GERD (gastroesophageal reflux disease)            Has the patient had major surgery during 100 days prior to admission? Yes   Family History   Family history is unknown by patient.   Current Medications  Current Medications    Current Facility-Administered Medications:  acetaminophen  (TYLENOL ) tablet 650 mg, 650 mg, Oral, Q6H PRN, 650 mg at 07/30/24 2332 **OR** acetaminophen  (TYLENOL ) suppository 650 mg, 650 mg, Rectal, Q6H PRN, Krishnan, Gokul, MD   butalbital -acetaminophen -caffeine  (FIORICET) 50-325-40 MG per tablet 2 tablet, 2 tablet, Oral, Q4H PRN, Janjua, Rashid M, MD   [COMPLETED] dexamethasone  (DECADRON ) tablet 4 mg, 4 mg, Oral, Q6H, 4 mg at 08/01/24 1616 **FOLLOWED BY** dexamethasone  (DECADRON ) tablet 4 mg, 4 mg, Oral, Q8H, Dewald, Jonathan B, MD, 4 mg at 08/03/24 0620   docusate sodium  (COLACE) capsule 100 mg, 100 mg, Oral, BID PRN, Autry, Lauren E, PA-C   insulin  aspart (novoLOG ) injection 0-9 Units, 0-9 Units, Subcutaneous, Q4H, Autry, Lauren E, PA-C, 1 Units at 08/03/24 0420   labetalol  (NORMODYNE ) injection 10-40 mg, 10-40 mg, Intravenous, Q10 min PRN, Janjua, Rashid M, MD   ondansetron  (ZOFRAN ) tablet 4 mg, 4 mg, Oral, Q6H PRN **OR** ondansetron  (ZOFRAN ) injection 4 mg, 4  mg, Intravenous, Q6H PRN, Krishnan, Gokul, MD   pantoprazole  (PROTONIX ) EC tablet 40 mg, 40 mg, Oral, Daily, Dewald, Jonathan B, MD, 40 mg at 08/02/24 2058   polyethylene glycol (MIRALAX  / GLYCOLAX ) packet 17 g, 17 g, Oral, Daily PRN, Adolph Tinnie BRAVO, PA-C, 17 g at 08/01/24 2325     Patients Current Diet:  Diet Order                  Diet - low sodium heart healthy             Diet regular Room service appropriate? Yes; Fluid consistency: Thin  Diet effective now                         Precautions / Restrictions Precautions Precautions: Fall Restrictions Weight Bearing Restrictions Per Provider Order: No    Has the patient had 2 or more falls or a fall with injury in the past year? Yes   Prior Activity Level Community (5-7x/wk): Went out daily.  worked as a Museum/gallery exhibitions officer at [employer]   Prior Functional Level Self Care: Did the patient need help bathing, dressing, using the toilet or eating? Independent   Indoor Mobility: Did the patient need assistance with walking from room to room (with or without device)? Independent   Stairs: Did the patient need assistance with internal or external stairs (with or without device)? Independent   Functional Cognition: Did the patient need help planning regular tasks such as shopping or remembering to take medications? Independent   Patient Information Are you of Hispanic, Latino/a,or Spanish origin?: A. No, not of Hispanic, Latino/a, or Spanish origin What is your race?: B. Black or African American Do you need or want an interpreter to communicate with a doctor or health care staff?: 0. No Patient information obtained via proxy : No   Patient's Response To:  Health Literacy and Transportation Is the patient able to respond to health literacy and transportation needs?: Yes Health Literacy - How often do you need to have someone help you when you read instructions, pamphlets, or other written material from your doctor or  pharmacy?: Always In the past 12 months, has lack of transportation kept you from medical appointments or from getting medications?: No In the past 12 months, has lack of transportation kept you from meetings, work, or from getting things needed for daily living?: No Higher education careers adviser obtained via proxy: No   Journalist, newspaper / Equipment Home Equipment: Agricultural consultant (2 wheels)   Prior Device  Use: Indicate devices/aids used by the patient prior to current illness, exacerbation or injury? None of the above   Current Functional Level Cognition   Arousal/Alertness: Awake/alert Overall Cognitive Status: Impaired/Different from baseline Orientation Level: Oriented X4 Attention: Sustained Sustained Attention: Appears intact Memory: Impaired Memory Impairment: Retrieval deficit (1/4 independent, needed semantic and choice cues) Awareness: Impaired Awareness Impairment: Intellectual impairment, Emergent impairment Problem Solving: Impaired Problem Solving Impairment: Verbal basic, Functional basic Safety/Judgment: Impaired    Extremity Assessment (includes Sensation/Coordination)   Upper Extremity Assessment: Right hand dominant, LUE deficits/detail, RUE deficits/detail RUE Deficits / Details: Coordination WFL, strength in elbow flex/ext and shoulders WFL LUE Deficits / Details: WFL strength and ROM, able to lift to top of head and maintain resistance against MMT 5/5. impaired coordination with FTN but improving with repetition. LUE Sensation: WNL LUE Coordination: decreased fine motor, decreased gross motor  Lower Extremity Assessment: LLE deficits/detail LLE Deficits / Details: Lt ankle DF 4-/5, Lt ankle eversion 3/5. knee extension 4+/5, knee flexion 4+/5, hip flexion 4/5,  hip abduction 4-/5, hip adduction 5/5. Abnormal HKS test on Lt. 3+ hyperreflexia Lt patellar tendon, absent ankle jerk. Reports normal sensation throughout LLE. LLE Coordination: decreased  gross motor     ADLs   Overall ADL's : Needs assistance/impaired Eating/Feeding: Minimal assistance, Sitting Grooming: Sitting, Minimal assistance Upper Body Bathing: Sitting, Minimal assistance Lower Body Bathing: Maximal assistance, Sitting/lateral leans Upper Body Dressing : Moderate assistance, Sitting Lower Body Dressing: Maximal assistance, Sitting/lateral leans Lower Body Dressing Details (indicate cue type and reason): pt able to don figure four, attempts to assist in donning shoes but continued to slide forward to edge of seat. Toilet Transfer: Maximal assistance, +2 for physical assistance, +2 for safety/equipment, BSC/3in1 Toileting- Clothing Manipulation and Hygiene: +2 for physical assistance, Sit to/from stand, Maximal assistance Functional mobility during ADLs: +2 for physical assistance, Moderate assistance, +2 for safety/equipment, Rolling walker (2 wheels) General ADL Comments: difficulty maniuplating caps, etc.     Mobility   Overal bed mobility: Needs Assistance Bed Mobility: Supine to Sit Supine to sit: Min assist, Used rails General bed mobility comments: MinA for slight steadying assist with trunk raise     Transfers   Overall transfer level: Needs assistance Equipment used: 2 person hand held assist Transfers: Sit to/from Stand, Bed to chair/wheelchair/BSC Sit to Stand: Max assist, +2 physical assistance, Mod assist Bed to/from chair/wheelchair/BSC transfer type:: Step pivot Stand pivot transfers: Max assist Step pivot transfers: Max assist, +2 physical assistance, +2 safety/equipment General transfer comment: cues to bring LE's underneath pt and to push from recliner handle to stand, fluctuates between ModAx2 to MaxAx2 with pt occasionally leaning posteriorly     Ambulation / Gait / Stairs / Wheelchair Mobility   Ambulation/Gait Ambulation/Gait assistance: Max assist, +2 physical assistance Gait Distance (Feet): 15 Feet (x15, x25, x15) Assistive device: 2  person hand held assist Gait Pattern/deviations: Step-to pattern, Decreased step length - left, Decreased stance time - left, Decreased stride length, Decreased dorsiflexion - left, Decreased weight shift to left, Knee flexed in stance - left, Steppage, Ataxic, Trunk flexed, Narrow base of support General Gait Details: narrow BOS with LE's scissoring. Poor control of LLE with decreased foot clearance. Cues to heel strike and for increased step length. Used 2HH for support and stability Gait velocity: decr Gait velocity interpretation: <1.31 ft/sec, indicative of household ambulator     Posture / Balance Dynamic Sitting Balance Sitting balance - Comments: CGA-minA Balance Overall balance assessment: Needs assistance Sitting-balance support: Feet  supported, Bilateral upper extremity supported Sitting balance-Leahy Scale: Poor Sitting balance - Comments: CGA-minA Postural control: Right lateral lean Standing balance support: Bilateral upper extremity supported, During functional activity, Reliant on assistive device for balance Standing balance-Leahy Scale: Zero Standing balance comment: reliant on MaxAx2     Special considerations/life events  Skin Abrasion: leg/bilateral; Surgical Incision: head and Diabetic management Novolog  0-9 units every 4 horus    Previous Home Environment (from acute therapy documentation) Living Arrangements: Spouse/significant other  Lives With: Significant other Available Help at Discharge: Family (Fiance) Type of Home: House Home Layout: One level Home Access: Stairs to enter Entrance Stairs-Rails: None Secretary/administrator of Steps: 3 Bathroom Shower/Tub: Associate Professor: No   Discharge Living Setting Plans for Discharge Living Setting: House, Lives with (comment) (Lives with SO, her daughter and her grand daughter) Type of Home at Discharge: House Discharge Home Layout: One level Discharge Home  Access: Stairs to enter Entrance Stairs-Number of Steps: 4 Discharge Bathroom Shower/Tub: Tub/shower unit, Curtain Discharge Bathroom Toilet: Standard Discharge Bathroom Accessibility: No (Bathroom may be too narrow?)   Social/Family/Support Systems Patient Roles: Partner (Has SO) Contact Information: Boby Cowden - SO [phone] Anticipated Caregiver: [name] - SO Ability/Limitations of Caregiver: Boby is not currently working and can provide supervision and assistance Caregiver Availability: 24/7 Discharge Plan Discussed with Primary Caregiver: Yes Is Caregiver In Agreement with Plan?: Yes Does Caregiver/Family have Issues with Lodging/Transportation while Pt is in Rehab?: No   Goals Patient/Family Goal for Rehab: PT/OT/SLP supervision to min assist goals Expected length of stay: 12-14 days Pt/Family Agrees to Admission and willing to participate: Yes Program Orientation Provided & Reviewed with Pt/Caregiver Including Roles  & Responsibilities: Yes   Decrease burden of Care through IP rehab admission: N/A   Possible need for SNF placement upon discharge: Not planned   Patient Condition: I have reviewed medical records from [facility], spoken with CM, and patient and spouse. I met with patient at the bedside for inpatient rehabilitation assessment.  Patient will benefit from ongoing PT, OT, and SLP, can actively participate in 3 hours of therapy a day 5 days of the week, and can make measurable gains during the admission.  Patient will also benefit from the coordinated team approach during an Inpatient Acute Rehabilitation admission.  The patient will receive intensive therapy as well as Rehabilitation physician, nursing, social worker, and care management interventions.  Due to bladder management, bowel management, safety, skin/wound care, disease management, medication administration, pain management, and patient education the patient requires 24 hour a day rehabilitation  nursing.  The patient is currently Mod-Max A +2 with mobility and Min-Max A with basic ADLs.  Discharge setting and therapy post discharge at home with home health is anticipated.  Patient has agreed to participate in the Acute Inpatient Rehabilitation Program and will admit today.   Preadmission Screen Completed By:  Lovett CHRISTELLA Ropes, 08/03/2024 10:19 AM ______________________________________________________________________   Discussed status with Dr. Babs on 08/03/24  at 10:19 AM and received approval for admission today.   Admission Coordinator:  Tinnie SHAUNNA Yvone Delayne, CCC-SLP, time 10:19 AM/Date 08/03/24     Assessment/Plan: Diagnosis: glioma Does the need for close, 24 hr/day Medical supervision in concert with the patient's rehab needs make it unreasonable for this patient to be served in a less intensive setting? Yes Co-Morbidities requiring supervision/potential complications: post-op sequelae, pain, mood, nutrition Due to bladder management, bowel management, safety, skin/wound care, disease management, medication administration, pain management,  and patient education, does the patient require 24 hr/day rehab nursing? Yes Does the patient require coordinated care of a physician, rehab nurse, PT, OT, and SLP to address physical and functional deficits in the context of the above medical diagnosis(es)? Yes Addressing deficits in the following areas: balance, endurance, locomotion, strength, transferring, bowel/bladder control, bathing, dressing, feeding, grooming, toileting, cognition, speech, and psychosocial support Can the patient actively participate in an intensive therapy program of at least 3 hrs of therapy 5 days a week? Yes The potential for patient to make measurable gains while on inpatient rehab is excellent Anticipated functional outcomes upon discharge from inpatient rehab: supervision and min assist PT, supervision and min assist OT, supervision and min assist SLP Estimated  rehab length of stay to reach the above functional goals is: 12-14 days Anticipated discharge destination: Home 10. Overall Rehab/Functional Prognosis: excellent     MD Signature: Arthea IVAR Gunther, MD, [facility] Physical Medicine & Rehabilitation Medical Director Rehabilitation Services 08/03/2024

## 2024-08-06 NOTE — Progress Notes (Signed)
 Physical Therapy Session Note  Patient Details  Name: Joshua Harmon MRN: [id number] Date of Birth: 05/06/1961  Today's Date: 08/06/2024 PT Individual Time: 0810-0910 PT Individual Time Calculation (min): 60 min  and Today's Date: 08/06/2024 PT Missed Time: 15 Minutes Missed Time Reason:  (eating)  Today's Date: 08/06/2024 PT Individual Time: 8694-8669 PT Individual Time Calculation (min): 25 min   Short Term Goals: Week 1:  PT Short Term Goal 1 (Week 1): Pt will complete bed mobility with MinA PT Short Term Goal 2 (Week 1): Pt will complete SPT using MinA + LRAD PT Short Term Goal 3 (Week 1): Pt will complete 20 ft amb using modA + LRAD PT Short Term Goal 4 (Week 1): Pt will initiate stair training  Skilled Therapeutic Interventions/Progress Updates:     1st Session: Pt misses 15 minutes of skilled PT due to finishing breakfast prior to session. Pt received semi reclined in bed and agrees to therapy. No complaint of pain. Pt completes supine to sit with maxA and cues for positioning at EOB, as well as body mechanics and posture. PT threads underpants and pants over each leg and assists to don shoes. Pt performs sit to stand with heavy maxA due to posterior bias and Lt hemibody ataxia. Stand pivot to [facility] with maxA and decreased trunk control, with pt attempting to sit well prior to being safely in front of chair, requiring PT to guide hips to stabile surface for safety. WC transport to gym for time management. Remainder of session completed in parallel bars to work on [employer], with mirror for visual feedback. Pt performs multiple reps of sit to stand, typically requiring minA to guide Lt hand positioning on bar, as well as heavy cueing for foot placement, body mechanics, and sequencing. PT provides frequent cues to utilize mirror for neutral posture, especially in cervical spine due to tendency for Rt lateral sidebending on rotation. Pt completes lateral weight shifting  progressing to marching in place with PT providing tactile facilitation at Lt knee to prevent hyperextension and provide NM feedback. Pt performs forward and backward steps progressing to x4' forward and x4' backward, with minA/modA and cues for posture and step sequencing. Pt left seated in WC with all needs within reach.   2nd Session: pt received seated in tilt in space WC and agrees to therapy. No complaint of pain. WC transport to gym for time management. Pt performs sit to stand with RW and minA, with mirror provided for visual feedback and PT cueing for hand placement, body mechanics, and sequencing. Pt performs static standing with cues to utilize mirror to attempt to achieve neutral posture. PT provides minA to modA for trunk stability and to correct for anterior and Lt sided lean. Pt takes multiple seated rest breaks. Pt performs gait training with 3 musketeers technique and +3 for WC follow. PT provides consistent cues for upright posture and trunk extension, as well as initiation and progression of LLE. Pt ambulates x50' prior to rest break. Following, pt completes x10 foot taps on 3 inch step with RLE to promote Lt lateral weight shifting and stance control. Following rest break, pt completes x10 foot taps with LLE, requiring minA increasing to modA with fatigue, working on coordination. WC transport back to room. Left seated in WC with all needs within reach.   Therapy Documentation Precautions:  Precautions Precautions: Fall Recall of Precautions/Restrictions: Impaired Precaution/Restrictions Comments: Poor awareness, L lean, ataxic Restrictions Weight Bearing Restrictions Per Provider Order: No  Therapy/Group: Individual Therapy  [name], PT, DPT 08/06/2024, 4:15 PM

## 2024-08-06 NOTE — Discharge Summary (Signed)
 Physician Discharge Summary  Patient ID: Joshua Harmon MRN: [id number] DOB/AGE: Jan 03, 1961 63 y.o.  Admit date: 08/03/2024 Discharge date: 08/21/2024  Discharge Diagnoses:  Principal Problem:   Brain tumor [facility]) Active Problems:   Brain mass   Nicotine  use   Leg weakness, bilateral Hyponatremia Constipation Leukocytosis Left elbow spasticity Right neck spasticity Memory impairment Proprioception deficits   discharged Condition: poor  Significant Diagnostic Studies: CT HEAD WO CONTRAST ( ) Result Date: 08/21/2024 CLINICAL DATA:  Initial evaluation for un witnessed fall. EXAM: CT HEAD WITHOUT CONTRAST TECHNIQUE: Contiguous axial images were obtained from the base of the skull through the vertex without intravenous contrast. RADIATION DOSE REDUCTION: This exam was performed according to the departmental dose-optimization program which includes automated exposure control, adjustment of the mA and/or kV according to patient size and/or use of iterative reconstruction technique. COMPARISON:  CT from 07/31/2024. FINDINGS: Brain: Patient's known right cerebral mass involving the right frontal parietal region again seen, grossly similar in size as compared to recent exams. Previously seen post biopsy hemorrhage has largely resolved. Surrounding edema is relatively similar. No other acute intracranial hemorrhage. No acute large vessel territory infarct. No other mass lesion or mass effect. No significant midline shift. No extra-axial fluid collection. Vascular: No abnormal hyperdense vessel. Skull: Scalp soft tissues demonstrate no acute finding. Prior right frontal burr hole craniotomy noted. Calvarium otherwise intact. Sinuses/Orbits: Globes orbital soft tissues demonstrate no acute finding. Remote posttraumatic defect noted at the right lamina papyracea. Paranasal sinuses are largely clear. No significant mastoid effusion. Other: None. IMPRESSION: 1. Relatively stable size of patient's known  right cerebral mass/GBM, with interval resolution of previously seen post biopsy hemorrhage. Surrounding mass effect is relatively stable. No significant midline shift. 2. No other new acute intracranial abnormality. Electronically Signed   By: Morene Hoard M.D.   On: 08/21/2024 01:12   DG Chest 2 View Result Date: 08/04/2024 EXAM: 2 VIEW(S) XRAY OF THE CHEST 08/04/2024 12:15:00 PM COMPARISON: None available. CLINICAL HISTORY: Pt order states leukocytosis. FINDINGS: LUNGS AND PLEURA: No focal pulmonary opacity. No pulmonary edema. No pleural effusion. No pneumothorax. HEART AND MEDIASTINUM: No acute abnormality of the cardiac and mediastinal silhouettes. Hiatal hernia. BONES AND SOFT TISSUES: No acute osseous abnormality. IMPRESSION: 1. No acute process. 2. Hiatal hernia. Electronically signed by: Katheleen Faes MD 08/04/2024 04:02 PM EDT RP Workstation: [id number]   CT HEAD WO CONTRAST Result Date: 07/31/2024 CLINICAL DATA:  Initial postoperative evaluation. EXAM: CT HEAD WITHOUT CONTRAST TECHNIQUE: Contiguous axial images were obtained from the base of the skull through the vertex without intravenous contrast. RADIATION DOSE REDUCTION: This exam was performed according to the departmental dose-optimization program which includes automated exposure control, adjustment of the mA and/or kV according to patient size and/or use of iterative reconstruction technique. COMPARISON:  Prior MRI from 07/25/2024. FINDINGS: Brain: Postoperative changes from interval right frontal burr hole craniotomy for presumed biopsy of previously identified right cerebral mass. Small amount of postoperative pneumocephalus overlies the right cerebral convexity. Small volume hemorrhage seen along the biopsy tract at the level of the right frontal operculum. Hemorrhage within the biopsy cavity/mass itself measures 1.8 x 1.4 x 1.8 cm (series 2, image 20). The underlying mass itself is otherwise grossly stable. Associated regional  mass effect with trace right-to-left shift at the septum pellucidum, not significantly changed. No visible intraventricular hemorrhage. No other acute intracranial hemorrhage. No acute large vessel territory infarct. No other mass lesion or mass effect. No hydrocephalus or extra-axial fluid collection. Vascular: No abnormal hyperdense  vessel. Scattered calcified atherosclerosis present at the skull base. Skull: Post craniotomy changes at the right frontal scalp. Skin staples in place. No adverse features. Sinuses/Orbits: Globes orbital soft tissues demonstrate no acute finding. Remote posttraumatic defect noted at the right lamina papyracea. Paranasal sinuses are largely clear. No mastoid effusion. Other: None. IMPRESSION: 1. Postoperative changes from interval right frontal burr hole craniotomy for biopsy of patient's known right cerebral mass. Small volume postoperative hemorrhage along the biopsy tract and within the resection cavity itself as detailed above. No other complicating features. 2. No other new acute intracranial abnormality. Electronically Signed   By: Morene Hoard M.D.   On: 07/31/2024 02:01   CT CHEST ABDOMEN PELVIS W CONTRAST Result Date: 07/25/2024 CLINICAL DATA:  Brain mass, assess for metastatic disease. EXAM: CT CHEST, ABDOMEN, AND PELVIS WITH CONTRAST TECHNIQUE: Multidetector CT imaging of the chest, abdomen and pelvis was performed following the standard protocol during bolus administration of intravenous contrast. RADIATION DOSE REDUCTION: This exam was performed according to the departmental dose-optimization program which includes automated exposure control, adjustment of the mA and/or kV according to patient size and/or use of iterative reconstruction technique. CONTRAST:  OMNIPAQUE  IOHEXOL  300 MG/ML  SOLN COMPARISON:  Abdominopelvic CT 01/29/2020 FINDINGS: CT CHEST FINDINGS Cardiovascular: Normal heart size. No pericardial effusion. Minimal aortic atherosclerosis without  aneurysm. Mediastinum/Nodes: No mediastinal or hilar adenopathy. Small hiatal hernia. No esophageal wall thickening. No suspicious thyroid nodule. Lungs/Pleura: Minimal emphysema. No pulmonary nodule or mass. Mild dependent atelectasis. No pleural effusion or thickening. The trachea and central airways are clear. Musculoskeletal: No evidence of lytic or blastic osseous lesion. Upper thoracic scoliosis. No chest wall soft tissue abnormalities. CT ABDOMEN PELVIS FINDINGS Hepatobiliary: No focal liver abnormality is seen. No gallstones, gallbladder wall thickening, or biliary dilatation. Pancreas: No evidence of pancreatic mass. No ductal dilatation or inflammation. Spleen: Normal in size without focal abnormality. Adrenals/Urinary Tract: No adrenal nodule. No renal mass or renal calculi. No hydronephrosis. No bladder wall thickening or evidence of bladder mass. Stomach/Bowel: Small hiatal hernia. There is no gastric wall thickening. No evidence of small bowel mass, obstruction or inflammation. Moderate volume of stool in the colon. No obvious colonic mass. Distal descending and sigmoid colonic diverticulosis without diverticulitis. Vascular/Lymphatic: Normal caliber abdominal aorta with mild atherosclerosis. Portal vein is patent. No suspicious lymphadenopathy. Reproductive: Prostate is unremarkable. Other: Right inguinal hernia repair. No ascites. No omental thickening. No subcutaneous lesion. Musculoskeletal: Peripherally sclerotic lucency within the left iliac bone is unchanged from 2021 and considered benign. No suspicious bone lesion. Multilevel degenerative change in the spine. 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 IMPRESSION: 1. No evidence of primary malignancy or metastatic disease in the chest, abdomen, or pelvis. 2. Small hiatal hernia. Colonic diverticulosis without diverticulitis. 3. 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 Aortic Atherosclerosis (ICD10-I70.0)  and Emphysema (ICD10-J43.9). Electronically Signed   By: Andrea Gasman M.D.   On: 07/25/2024 23:08   MR Cervical Spine W and Wo Contrast Result Date: 07/25/2024 CLINICAL DATA:  Provided history: Ataxia, nontraumatic, cervical pathology suspected. EXAM: MRI CERVICAL SPINE WITHOUT AND WITH CONTRAST TECHNIQUE: Multiplanar and multiecho pulse sequences of the cervical spine, to include the craniocervical junction and cervicothoracic junction, were obtained without and with intravenous contrast. CONTRAST:  6mL GADAVIST  GADOBUTROL  1 MMOL/ML IV SOLN COMPARISON:  None. FINDINGS: Alignment: Levocurvature of the cervical spine. Nonspecific straightening of the expected cervical lordosis. 4 mm C3-C4 grade 1 anterolisthesis. Slight C4-C5 grade 1 anterolisthesis. 5 mm  C7-T1 grade 1 anterolisthesis. Vertebrae: Multilevel degenerative endplate irregularity. Mild degenerative endplate edema at R5-R4, C5-C6 and C6-C7. Small hemangioma within the C3 vertebral body. Facet ankylosis on the left at C2-C3 and on the left at C7-T1. Multifocal marrow edema and enhancement within the posterior elements, likely degenerative and related to facet arthropathy Cord: No signal abnormality identified within the cervical spinal cord. No pathologic spinal cord enhancement. Multilevel spinal cord flattening as described below. Posterior Fossa, vertebral arteries, paraspinal tissues: Posterior fossa assessed on same-day brain MRI. Flow voids preserved within visible portions of the cervical vertebral arteries. No paraspinal mass or collection. Nonspecific ill-defined edema and enhancement within the paraspinal soft tissues on the right at the C4-C7 levels. Disc levels: Multilevel disc degeneration, greatest at C4-C5, C5-C6 and C6-C7 (moderate-to-advanced at these levels). Also of note, disc degeneration is moderate at C7-T1. Developmentally narrow cervical spinal canal due to short pedicles. C2-C3: Facet ankylosis on the left. No significant disc  herniation or stenosis. C3-C4: Grade 1 anterolisthesis. Uncovertebral hypertrophy on the right. Advanced facet arthropathy on the right. Mild ligamentum flavum thickening. Mild spinal canal stenosis. Severe right neural foraminal narrowing. C4-C5: Grade 1 anterolisthesis. Posterior disc osteophyte complex with bilateral disc osteophyte ridge/uncinate hypertrophy. Moderate facet arthropathy (greater on the left). Ligamentum flavum thickening. Severe spinal canal stenosis with spinal cord flattening. Severe bilateral neural foraminal narrowing. C5-C6: Posterior disc osteophyte complex with bilateral disc osteophyte ridge/uncinate hypertrophy. Facet arthropathy (greater on the right and moderate-to-advanced on the right). Mild ligamentum flavum thickening. Mild spinal canal stenosis. Moderate-to-severe bilateral neural foraminal narrowing (greater on the left). C6-C7: Posterior disc osteophyte complex with bilateral disc osteophyte ridge/uncinate hypertrophy. Mild facet arthropathy. No significant spinal canal stenosis. Bilateral neural foraminal narrowing (mild-to-moderate right, moderate-to-severe left). C7-T1: Grade 1 anterolisthesis. Shallow disc bulge. Moderate facet arthropathy on the right. Facet ankylosis on the left. Mild ligamentum flavum thickening. Mild spinal canal stenosis. The disc bulge slightly flattens the ventral aspect of the spinal cord. Severe bilateral neural foraminal narrowing. IMPRESSION: 1. Cervical spondylosis as outlined within the body of the report. 2. At C4-C5, there is multifactorial severe spinal canal stenosis (with spinal cord flattening). Severe bilateral neural foraminal narrowing. 3. No more than mild spinal canal stenosis at the remaining levels. 4. Additional sites of foraminal stenosis, greatest on the right at C3-C4 (severe), bilaterally at C5-C6 (moderate-to-severe), on the left at C6-C7 (moderate-to-severe) and bilaterally at C7-T1 (severe). 5. Disc degeneration is greatest  at C4-C5, C5-C6 and C6-C7 (moderate-to-advanced in severity with mild degenerative endplate edema at these levels). 6. Grade 1 anterolisthesis at C3-C4, C4-C5 and C7-T1. 7. Levocurvature of the cervical spine. 8. Multifocal marrow edema and enhancement within the posterior elements, likely degenerative and related to facet arthropathy. 9. Facet ankylosis on the left at C2-C3 and on the left at C7-T1. 10. Nonspecific edema and enhancement within the right paraspinal soft tissues at the C4-C7 levels. Electronically Signed   By: Rockey Childs D.O.   On: 07/25/2024 19:32   MR THORACIC SPINE W WO CONTRAST Result Date: 07/25/2024 EXAM: MRI THORACIC SPINE WITH AND WITHOUT INTRAVENOUS CONTRAST 07/25/2024 05:42:39 PM TECHNIQUE: Multiplanar multisequence MRI of the thoracic spine was performed with and without the administration of intravenous contrast. COMPARISON: None available. CLINICAL HISTORY: Bone lesion, thoracic spine, incidental. Patient reports increasing weakness in his legs, multiple falls, and stiffness in his legs. History of polio with prior multiple surgeries. Prescribed gabapentin  with no significant help. FINDINGS: BONES AND ALIGNMENT: Mild thoracic levoscoliosis. Normal vertebral body heights. Bone  marrow signal is unremarkable. No abnormal enhancement. SPINAL CORD: Normal spinal cord volume. Normal spinal cord signal. SOFT TISSUES: Unremarkable. DEGENERATIVE CHANGES: Minimal degenerative disc disease. No spinal canal stenosis or neural foraminal narrowing. IMPRESSION: 1. No spinal canal stenosis or neural foraminal narrowing in the thoracic spine. 2. Mild thoracic levoscoliosis. Electronically signed by: Franky Stanford MD 07/25/2024 07:13 PM EDT RP Workstation: [id number]   MR Brain W and Wo Contrast Result Date: 07/25/2024 EXAM: MRI BRAIN WITH AND WITHOUT CONTRAST 07/25/2024 05:42:39 PM TECHNIQUE: Multiplanar multisequence MRI of the head/brain was performed with and without the administration of  intravenous contrast. COMPARISON: CT head 06/06/2024 CLINICAL HISTORY: Headache, increasing frequency or severity. Patient reports increasing weakness in his legs and multiple falls. History of polio with prior multiple surgeries. FINDINGS: BRAIN AND VENTRICLES: There is a peripheral enhancing mass centered in the right frontoparietal white matter involving the centrum semiovale and extending into the periventricular white matter and corona radiata. The mass measures 4.5 x 4.5 x 2.9 cm. The mass abuts the ependymal surface of the right lateral ventricle with associated mass effect on the ventricle. Mild diffusion restriction. There is associated susceptibility along the periphery of the mass. Surrounding T2/FLAIR hyperintensity suggestive of peritumoral edema which involves the posterior aspect of the right centrum semiovale and the right periventricular white matter. Additional T2/FLAIR hyperintensity in the periventricular and subcortical white matter suggestive of chronic microvascular ischemic changes. There is mild cerebral volume loss. No significant midline shift. No evidence of acute infarct. ORBITS: No acute abnormality. SINUSES: Mild mucosal thickening in the left maxillary sinus. Chronic deformity of the right lamina papyracea. BONES AND SOFT TISSUES: Normal bone marrow signal and enhancement. No acute soft tissue abnormality. IMPRESSION: 1. Peripheral enhancing mass in the right frontoparietal lobes measuring 4.5 x 4.5 x 2.9 cm, with mild associated peritumoral edema. The mass abuts the ependymal surface of the right lateral ventricle, causing mass effect without significant midline shift. 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 2. Additional T2/FLAIR hyperintensity in the periventricular and subcortical white matter, suggestive of chronic microvascular ischemic changes. 3. No acute infarct. Electronically signed by: Donnice Mania MD 07/25/2024 06:59 PM EDT RP  Workstation: [id number]   CT HEAD WO CONTRAST ( ) Result Date: 07/25/2024 CLINICAL DATA:  Provided history: Headache, new onset. EXAM: CT HEAD WITHOUT CONTRAST TECHNIQUE: Contiguous axial images were obtained from the base of the skull through the vertex without intravenous contrast. RADIATION DOSE REDUCTION: This exam was performed according to the departmental dose-optimization program which includes automated exposure control, adjustment of the mA and/or kV according to patient size and/or use of iterative reconstruction technique. COMPARISON:  Head CT 05/07/2024. FINDINGS: Brain: Since the head CT of 05/07/2024, interval increase in size of a lesion centered within the right frontoparietal white matter, now measuring 4.9 x 3.7 cm (for instance as seen on series 5, image 23) (series 2, image 20). The lesion also appears to extend to involve the callosal body (for instance as seen on series 4, image 36). This is most suspicious for a mass. Centrally, the lesion is hypodense. There is ill-defined hyperdensity along the which could reflect hypercellularity, mineralization and/or associated hemorrhage. Progressive mass effect with partial effacement of the right lateral ventricle. No midline shift. No demarcated cortical infarct. No extra-axial fluid collection. Vascular: No hyperdense vessel.  Atherosclerotic calcifications. Skull: No calvarial fracture or aggressive osseous lesion. Visible sinuses/orbits: No mass or acute finding within the imaged orbits. Chronic medially displaced fracture deformity of the right lamina  papyracea. Mild mucosal thickening within the right frontal sinus inferiorly. IMPRESSION: 4.9 x 3.7 cm lesion centered within the right frontoparietal white matter, as described and increased in size since the head CT of 05/07/2024. This is most suspicious for a mass (such as a high-grade primary CNS neoplasm, metastatic lesion or lymphoma). However, a brain MRI (with and without contrast) is  recommended for further characterization. Progressive mass effect with partial effacement of the right lateral ventricle. No midline shift. Ill-defined hyperdensity at the periphery of the lesion which could reflect hypercellularity, mineralization and/or hemorrhage. Electronically Signed   By: Rockey Childs D.O.   On: 07/25/2024 16:20    Labs:  Basic Metabolic Panel: Recent Labs  Lab 08/17/24 0550 08/18/24 0502  NA 132* 134*  K 4.5 4.5  CL 98 99  CO2 22 25  GLUCOSE 114* 120*  BUN 16 16  CREATININE 0.70 0.77  CALCIUM 8.5* 8.7*    CBC: No results for input(s): WBC, NEUTROABS, HGB, HCT, MCV, PLT in the last 168 hours.   CBG: No results for input(s): GLUCAP in the last 168 hours.   Brief HPI:   Joshua Harmon is a 63 y.o. male  with past medical history of polio as a child, Hx of inguinal hernia s/p post robotic assisted repair who presented to [facility] on 07/25/2024 with symptoms of ataxia and leg weakness.  Per chart review the symptoms have been ongoing for several weeks but had worsened causing him difficulty with ambulation where he was holding onto furniture in his house to get around.  No history of significant falls or LOC. The patient was admitted to [facility] for evaluation and was found to have a brain mass.  A CT of the head revealed a 4.9 x 3.7 centimeter lesion centered within the right frontal parietal white matter, suspicious for a high-grade primary CNS neoplasm or metastatic lesion. A CT of the chest, abdomen, and pelvis showed no evidence of primary malignancy or metastatic disease.  An MRI of the brain revealed a peripheral enhancing mass in the right frontal temporal region. He was transferred to [facility] and admitted on 07/26/2024 for neurosurgical evaluation.  Where he underwent a right stereotactic brain biopsy on 07-30-2024 by Dr. Rosslyn.  A repeat head CT showed a small volume hemorrhage along the biopsy track.  Prior to arrival, the patient was independent with self-care ADLs but currently required moderate to maximal assistance with cues for mobility and minimal to maximal assistance with basic ADLs. Therapy evaluations completed due to patient decreased functional mobility and was admitted for a comprehensive rehab program.    Hospital Course: Joshua Harmon was admitted to rehab 08/03/2024 for inpatient therapies to consist of PT, ST and OT at least three hours five days a week. Past admission physiatrist, therapy team and rehab RN have worked together to provide customized collaborative inpatient rehab.     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 Rosslyn 07/31/19/2025.  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  He was maintained on dexamethasone  steroid Royd taper initiated 4 mg and then decreased to 2 mg every 8 hours with plans to continue indefinitely until radiation treatment begins.  Impaired glucose tolerance secondary to steroids.  CBG monitoring stable and patient no longer needed SSI or monitoring.  Upon discharge patient was transferred via [facility] to [facility] oncology for evaluation by Dr. Buckley and for the initiation of treatment.  Pain Management: Tylenol  and Fioricet as needed for headaches.  Patient denied pain.  Mood/Behavior/Sleep: TeleSitter was ordered for safety and close monitoring and for impulse activity.  Use sleep log and trazodone  50 mg at bedtime as needed for sleep.  He remained very impulsive and frustrated about limitations.    Neuropsych/Cognition: The patient was not capable of making his own decisions.  Methylphenidate  5 mg twice daily was initiated for attention and lethargy on trial with much improved results of arousal despite concentration difficulties.  Methylphenidate  was discontinued but was later resumed due to poor insight and worsening hemineglect with therapies.     Skin/Wound Care: Right frontal biopsy  site remained clean dry and intact with no signs of infection.  Staples were removed on 9/19.  Routine pressure relief measures maintained. Wound care/signs and symptoms of infection discussed at discharge.    Fluid/Nutrition/Electrolytes: Intake and output were monitored. The patient was maintained on a regular diet.  Protonix  was continued for GERD.   Blood pressures were monitored on TID basis and remained stable.   Leukocytosis: WBC remains slightly elevated on Decadron , patient was afebrile and showed no signs of infection.  Further monitoring and taper per oncology Dr. Buckley.  Right neck tone/spasticity: He was started on baclofen  and showed improvement with left elbow spasticity and range of motion.  Hyponatremia: Felt to be related to ongoing glucocorticoids he was placed on fluid restriction.  Blood and urine studies showed his estimated serum osmolarity 278 and urine 565 with sodium of 87.  He was continued on fluid restriction.  Sodium reviewed at discharge improved to 134.  Patient will need to follow-up with PCP that was established with Dr. Zafirov.   Rehab course: During patient's stay in rehab weekly team conferences were held to monitor patient's progress, set goals and discuss barriers to discharge. At admission, patient required moderate to maximal assistance with cues for mobility and minimal to maximal assistance with basic ADLs.   Occupational Therapy: Patient met 10 of 11 long term goals and has had improvement in activity tolerance, balance, postural control as well as ability to compensate for deficits functional use of left upper and left lower extremity.  He will discharge at overall mod assist level.  He continues to be limited by ongoing severe ataxia, apraxia, left proprioception, sensory deficits, and awareness deficits.  He did not meet his awareness goals as he is still severely impaired and is consistently overestimates ability to perform transfers and ADLs  independently.     Physical therapy: Patient did not meet long-term goals due to increasing level of assistance required for functional mobility with increased ataxia and motor planning impairment.  He will discharge at an ambulatory level mod assist +2.  His care partner requires assistance to provide the necessary physical and cognitive assistance at discharge.  Patient ultimately discharged earlier than originally planned due to initiation of chemo treatment.  He will benefit from ongoing skilled PT services.   Family teaching completed upon discharge to home with significant other Vicke.      Disposition:  Discharge disposition: [facility]       Diet: Regular  Special Instructions:  -Written driving restriction instructions provided  -No driving or operating heavy machinery until cleared by provider  -No smoking or alcohol or illicit drug use     Allergies as of 08/21/2024   No Known Allergies      Medication List     TAKE these medications    acetaminophen  325  MG tablet Commonly known as: TYLENOL  Take 2 tablets (650 mg total) by mouth every 6 (six) hours as needed for mild pain (pain score 1-3) or fever (or Fever >/= 101).   Baclofen  5 MG Tabs Take 1 tablet (5 mg total) by mouth 2 (two) times daily.   butalbital -acetaminophen -caffeine  50-325-40 MG tablet Commonly known as: FIORICET Take 2 tablets by mouth every 4 (four) hours as needed for headache (moderate pain).   dexamethasone  4 MG tablet Commonly known as: DECADRON  Take 0.5 tablets (2 mg total) by mouth every 8 (eight) hours.   magnesium gluconate 500 (27 Mg) MG Tabs tablet Commonly known as: MAGONATE Take 0.5 tablets (250 mg total) by mouth daily. Start taking on: August 22, 2024   methylphenidate  5 MG tablet Commonly known as: RITALIN  Take 1 tablet (5 mg total) by mouth 2 (two) times daily with breakfast and lunch. Start taking on: August 22, 2024   pantoprazole  40 MG  tablet Commonly known as: PROTONIX  Take 1 tablet (40 mg total) by mouth daily.   polyethylene glycol 17 g packet Commonly known as: MIRALAX  / GLYCOLAX  Take 17 g by mouth daily as needed for moderate constipation. What changed:  when to take this reasons to take this additional instructions   senna-docusate 8.6-50 MG tablet Commonly known as: Senokot-S Take 1 tablet by mouth at bedtime as needed for mild constipation.   traZODone  50 MG tablet Commonly known as: DESYREL  Take 1 tablet (50 mg total) by mouth at bedtime as needed for sleep.             Follow-up Information     Emeline Joesph BROCKS, DO Follow up.   Specialty: Physical Medicine and Rehabilitation Why: Office will call for appointment Contact information: [address] [place] [address] [phone]         Rosslyn Dino HERO, MD Follow up.   Specialty: Neurosurgery Why: Post Op appt 08/31/2024 at 12:20. Contact information: [address] [place] [address] [place] [address] [phone]         Buckley Arthea POUR, MD Follow up.   Specialties: Psychiatry, Neurology, Oncology Why: Appointment 08/21/2024 at 2 PM Contact information: [address] [place] [address] [phone]                  Signed: Daphne LITTIE Satterfield 08/21/2024, 11:10 AM

## 2024-08-07 DIAGNOSIS — D496 Neoplasm of unspecified behavior of brain: Secondary | ICD-10-CM | POA: Diagnosis not present

## 2024-08-07 LAB — GLUCOSE, CAPILLARY: Glucose-Capillary: 149 mg/dL — ABNORMAL HIGH (ref 70–99)

## 2024-08-07 MED ORDER — TRAZODONE HCL 50 MG PO TABS
50.0000 mg | ORAL_TABLET | Freq: Every evening | ORAL | Status: DC | PRN
  Administered 2024-08-07 – 2024-08-20 (×5): 50 mg via ORAL
  Filled 2024-08-07 (×5): qty 1

## 2024-08-07 MED ORDER — METHYLPHENIDATE HCL 5 MG PO TABS
5.0000 mg | ORAL_TABLET | Freq: Two times a day (BID) | ORAL | Status: DC
  Administered 2024-08-07 – 2024-08-14 (×15): 5 mg via ORAL
  Filled 2024-08-07 (×15): qty 1

## 2024-08-07 NOTE — Progress Notes (Signed)
   08/07/24 1600  Spiritual Encounters  Type of Visit Initial  Care provided to: Pt and family  Referral source Patient request  Reason for visit Advance directives  OnCall Visit No  Interventions  Spiritual Care Interventions Made Established relationship of care and support;Compassionate presence;Reflective listening;Normalization of emotions;Encouragement  Intervention Outcomes  Outcomes Awareness of support   Chaplain responded to Spiritual Consult for Advance Care Directive.  Chaplain arrived in room and delivered ACD education. Instructed Pt how to reach out to Spiritual Care if they have any questions and to contact us  when they are ready to move forward.  Chaplain services remain available as the need arises by Spiritual Consult or for emergent cases.

## 2024-08-07 NOTE — Progress Notes (Signed)
 Speech Language Pathology Daily Session Note  Patient Details  Name: Joshua Harmon MRN: [id number] Date of Birth: 09/14/1961  Today's Date: 08/07/2024 SLP Individual Time: 9199-9154 SLP Individual Time Calculation (min): 45 min  Short Term Goals: Week 1: SLP Short Term Goal 1 (Week 1): Pt will utilize compensatory memory strategies as needed to recall recent/relevant info w/ minA SLP Short Term Goal 2 (Week 1): Pt will sustain attention to task in a mildly distracting environment for ~10 mins given minA SLP Short Term Goal 3 (Week 1): Pt will solve functional problems w/ minA SLP Short Term Goal 4 (Week 1): Pt will improve L visual attention to minA SLP Short Term Goal 5 (Week 1): Pt will recall 2 changes in cognition given modA  Skilled Therapeutic Interventions:   Pt and nursing greeted at bedside. He was awake and requested assistance with his morning meal. He required modA cues for L visual attention, problem solving, and awareness. He did present w/ mild to moderate L buccal pocketing throughout the meal, and initially demonstrated no awareness of this. SLP provided verbal cues x2 for awareness and pt was able to independently clear out his cheek via lingual sweep otherwise. Of note, this did take additional time for pt to initiate. Pt presented w/ rapid rate of intake, though SO and pt report that at baseline. Did not add dysphagia goals to POC at this time, as no overt s/s of airway invasion were noted and pt was clearing pocketing @ modI by the end of the meal. Will, however, continue to target overall awareness. Pt was initially perseverative on showering after meal, however, was able to be redirected w/ education re SLP role and review of therapy schedule. He then completed a written organization task targeting L visual attention, sustained attention, and problem solving. He benefited maxA initially, though success improved to modA as the task continued. Adequate sustained and selective  attention noted throughout. At the end of tx tasks, he was left in bed w/ the alarm set and call light within reach. Recommend cont ST per POC.   Pain  None reported  Therapy/Group: Individual Therapy  [name] 08/07/2024, 3:56 PM

## 2024-08-07 NOTE — Progress Notes (Signed)
 Physical Therapy Session Note  Patient Details  Name: Joshua Harmon MRN: [id number] Date of Birth: 02-06-1961  Today's Date: 08/07/2024 PT Individual Time: 1345-1415 PT Individual Time Calculation (min): 30 min   Short Term Goals: Week 1:  PT Short Term Goal 1 (Week 1): Pt will complete bed mobility with MinA PT Short Term Goal 2 (Week 1): Pt will complete SPT using MinA + LRAD PT Short Term Goal 3 (Week 1): Pt will complete 20 ft amb using modA + LRAD PT Short Term Goal 4 (Week 1): Pt will initiate stair training  Skilled Therapeutic Interventions/Progress Updates:     Upon PT entry, pt requesting to postpone therapy until girlfriend leaves (who is in shower). PT encourages pt to participate and pt is insistent on waiting. Upon return 20 minutes later, pt still deferring therapy and girlfriend still in bathroom. On 3rd attempt, pt agrees to therapy and girlfriend stating she is not leaving until later this afternoon. Pt educated on importance of participation during scheduled time. No complaint of pain. WC transport to gym. Pt performs sit to stand in parallel bars with modA initially progressing to maxA due to quick LOB forward with LUE losing grip on bar and pt having a difficulty time correcting. Pt eventually able to stand with both hands on one bar for support, facing mirror for visual feedback. Pt perform sidesteps Rt and Lt to work on [employer] and coordination. Pt requires modA overall with frequent cues to utilize vision for assistance, and to bring posture and gaze back to midline. Following rest break, pt tasked with standing and facing mirror and removing squigz with RUE first and then with LUE. Performed for balance and coordination training, as well as training Lt sided attention. Pt requires modA and LUE weight support, with cues to utilize vision for assistance. WC transport back to room. Left seated with all needs within reach.   Therapy Documentation Precautions:   Precautions Precautions: Fall Recall of Precautions/Restrictions: Impaired Precaution/Restrictions Comments: Poor awareness, L lean, ataxic Restrictions Weight Bearing Restrictions Per Provider Order: No   Therapy/Group: Individual Therapy  [name], PT, DPT 08/07/2024, 4:30 PM

## 2024-08-07 NOTE — Plan of Care (Signed)
  Problem: Consults Goal: RH GENERAL PATIENT EDUCATION Description: See Patient Education module for education specifics. Outcome: Progressing   Problem: RH BOWEL ELIMINATION Goal: RH STG MANAGE BOWEL WITH ASSISTANCE Description: STG Manage Bowel with supervision-min  Assistance. Outcome: Progressing   Problem: RH BLADDER ELIMINATION Goal: RH STG MANAGE BLADDER WITH ASSISTANCE Description: STG Manage Bladder With supervision-min Assistance Outcome: Progressing   Problem: RH SKIN INTEGRITY Goal: RH STG SKIN FREE OF INFECTION/BREAKDOWN Description: Manage skin free of infection with supervision- min assistance  Outcome: Progressing   Problem: RH SAFETY Goal: RH STG ADHERE TO SAFETY PRECAUTIONS W/ASSISTANCE/DEVICE Description: STG Adhere to Safety Precautions With supervision-min Assistance/Device. Outcome: Progressing   Problem: RH PAIN MANAGEMENT Goal: RH STG PAIN MANAGED AT OR BELOW PT'S PAIN GOAL Description: < 4 w/ prns Outcome: Progressing   Problem: RH KNOWLEDGE DEFICIT GENERAL Goal: RH STG INCREASE KNOWLEDGE OF SELF CARE AFTER HOSPITALIZATION Description: Manage increase knowledge of self care after hospitalization with supervision- min assistance from partner using educational materials provided Outcome: Progressing   Problem: RH COGNITION-NURSING Goal: RH STG USES MEMORY AIDS/STRATEGIES W/ASSIST TO PROBLEM SOLVE Description: STG Uses Memory Aids/Strategies With min- supervision Assistance to Problem Solve. Outcome: Progressing   Problem: RH KNOWLEDGE DEFICIT BRAIN INJURY Goal: RH STG INCREASE KNOWLEDGE OF SELF CARE AFTER BRAIN INJURY Description: Increase knowledge of self care after brain injury with min-supervision assistance Outcome: Progressing

## 2024-08-07 NOTE — Patient Care Conference (Signed)
 Inpatient RehabilitationTeam Conference and Plan of Care Update Date: 08/07/2024   Time: 1017 am    Patient Name: Joshua Harmon      Medical Record Number: [id number]  Date of Birth: Jun 13, 1961 Sex: Male         Room/Bed: 4W18C/4W18C-01 Payor Info: Payor: [employer]UE [employer]UE [employer] / Plan: BCBS COMM PPO / Product Type: *No Product type* /    Admit Date/Time:  08/03/2024  5:01 PM  Primary Diagnosis:  Brain tumor [facility])  Hospital Problems: Principal Problem:   Brain tumor [facility])    Expected Discharge Date: Expected Discharge Date: 08/29/24  Team Members Present: Physician leading conference: Dr. Joesph Likes Social Worker Present: [name] Jude, LCSW Nurse Present: Eulalio Falls, RN PT Present: Kirt Dawn, PT OT Present: Nena Moats, OT SLP Present: Recardo Mole, SLP     Current Status/Progress Goal Weekly Team Focus  Bowel/Bladder   Patient is continent of bowel and bladder   Remain continent of bowel and bladder   Assist patient with toileting as needed.    Swallow/Nutrition/ Hydration   regular/thin - L buccal pocketing w/ reduced awareness of this           ADL's   Mod A ADLs, max A LB, mod-max A ADL transfers with heavy/skilled cueing for midline orientation, sequencing, trunk and head management. LUE with full AROM and strength but very ataxic and little proprioception. Very limited awareness. Severe L inattention   CGA to supervision   ADLs, transfers, midline orientation, LUE NMR, L attention    Mobility   independent bed mobility, mod(I) transfers and gait >500', stairs (S)   Supervision  DC prep    Communication                Safety/Cognition/ Behavioral Observations  moderate to severe deficits - memory, problem solving, awareness, L visual attention   supervision   pt/family education, cognitive re training,    Pain   No complaints of pain.   Patient will remain free of pain   Assess pain score and provide intervention as needed     Skin   Surgical incision to head with staples. Remains clean and dry   Skin will remain free from infection  Assess skin every shift and monitor for breakdown. Keep head incision clean.      Discharge Planning:  Pt will d/c to home with s/o as primary caregiver. SW will confirm there are no barriers to discharge.    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 Patient with on and off confusion: medication adjusted by MD. Progress limited by impulsivity,  severe left inattention, left ataxia, perseveration, mod - severe memory deficits.  Patient on target to meet rehab goals: Currently patient needs mod-max assistance with ADLs , transfers and mobility with max cueing due to poor awareness and ataxic movements. Overall goals at discharge are set for CGA to supervision assistance.  *See Care Plan and progress notes for long and short-term goals.   Revisions to Treatment Plan:  Telesitter Sleep log Tilt n space wheelchair  Teaching Needs: Safety, medications, transfers, toileting, incision care, etc   Current Barriers to Discharge: Decreased caregiver support, Home enviroment access/layout, and Behavior  Possible Resolutions to Barriers: Family Education      Medical Summary Current Status: medically complicated by brain CA, post-op wound care, L hemineglect, transaminitis and hyperglycemia  Barriers to Discharge: Behavior/Mood;Complicated Wound;Electrolyte abnormality;Medical stability   Possible Resolutions to [employer] Focus: monitor liver function  and adjust medications as needed, monitor BG while on decadron  taper, telemonitorring for poor insight, staple removal this week   Continued Need for Acute Rehabilitation Level of Care: The patient requires daily medical management by a physician with specialized training in physical medicine and rehabilitation for the following reasons: Direction of a multidisciplinary physical  rehabilitation program to maximize functional independence : Yes Medical management of patient stability for increased activity during participation in an intensive rehabilitation regime.: Yes Analysis of laboratory values and/or radiology reports with any subsequent need for medication adjustment and/or medical intervention. : Yes   I attest that I was present, lead the team conference, and concur with the assessment and plan of the team.   Lorelai Huyser Gayo 08/07/2024, 1017 am

## 2024-08-07 NOTE — Progress Notes (Signed)
 Occupational Therapy Session Note  Patient Details  Name: Joshua Harmon MRN: [id number] Date of Birth: Sep 20, 1961  Today's Date: 08/07/2024 OT Individual Time: 9151-9041 OT Individual Time Calculation (min): 70 min    Short Term Goals: Week 1:  OT Short Term Goal 1 (Week 1): Pt will don shirt with mod A OT Short Term Goal 2 (Week 1): Pt will complete a stand pivot transfer to the w/c with mod A OT Short Term Goal 3 (Week 1): Pt will improve LUE ataxia by using the LUE during bathing with min A OT Short Term Goal 4 (Week 1): Pt will don pants with mod A OT Short Term Goal 5 (Week 1): Pt will right trunk and head in sitting with no more than mod facilitation  Skilled Therapeutic Interventions/Progress Updates:    Pt received supine with no c/o pain, agreeable to OT session with OT deferring shower for sink side wash up to save time for other non-ADL intervention. He came to EOB with min A, requiring mod cueing for sequencing and L attention. Worked on static standing while he completed ADLs, cueing and facilitation for trunk and head at midline, as well as pelvis in neutral d/t sitting in a large posterior tilt. He completed UB dressing with mod A and mod cueing for L visual attention and trunk control. He stood from EOB x4 to doff underwear/pants and don new ones. He required mod A to stand and maintain static standing balance with cueing for LE positioning to widen BOS. He required mod A to thread BLE into pants and pull up in standing. He completed stand pivot to the w/c with mod A. Facilitated BUE integration during oral care set up with mod cueing required for pt to place LUE on the sink. Pt was then taken to the therapy gym in the w/c. Stand pivot to the mat with mod A. Positioned a wedge underneath him to facilitate more anterior/neutral pelvic tilt which was very successful. He worked on functional reaching toward the R to elongate trunk and work on more dynamic sitting balance. Then worked  on reaching L to address ataxic LUE NMR and weight shifting toward the L. He required min-mod facilitation for proximal support when reaching in open chain to facilitate more successful target reaching. He was facilitated posteriorly into chest opening and head to neutral with mod cueing throughout for visual attention to the L and using mirror to find midline with his trunk. He then completed 100 ft of functional mobility using 3 musketeers guarding technique. He required mod A +2 with heavy facilitation at the trunk to promote upright posture. He returned to his room following. He was left sitting up with all needs met, chair alarm set. Fiance present.   Therapy Documentation Precautions:  Precautions Precautions: Fall Recall of Precautions/Restrictions: Impaired Precaution/Restrictions Comments: Poor awareness, L lean, ataxic Restrictions Weight Bearing Restrictions Per Provider Order: No  Therapy/Group: Individual Therapy  Nena VEAR Moats 08/07/2024, 8:25 AM

## 2024-08-07 NOTE — Progress Notes (Addendum)
 PROGRESS NOTE   Subjective/Complaints:  No events overnight. Doing well overall - working on getting disabilitty appointment set up today. Fiance at bedside, aware of glioma Dx and need for chemo/radiation but concerned that these have not been started. Asking about treatment plan and prognosis - I told them this is a conversation for oncology but will look into what has been set up.  Vitals stable Eating well, continent, LBM 9/14.    ROS: Patient denies fever, rash, sore throat, blurred vision, dizziness, nausea, vomiting, diarrhea, cough, shortness of breath or chest pain, joint or back/neck pain,   or mood change.   Objective:   No results found.  Recent Labs    08/05/24 0534 08/06/24 0555  WBC 14.2* 15.0*  HGB 15.2 15.0  HCT 45.3 43.8  PLT 360 379   Recent Labs    08/06/24 0555  NA 134*  K 4.4  CL 98  CO2 25  GLUCOSE 123*  BUN 16  CREATININE 0.90  CALCIUM 9.0     Intake/Output Summary (Last 24 hours) at 08/07/2024 0920 Last data filed at 08/07/2024 0800 Gross per 24 hour  Intake 962 ml  Output 2775 ml  Net -1813 ml        Physical Exam: Vital Signs Blood pressure 103/81, pulse 76, temperature 97.8 F (36.6 C), temperature source Oral, resp. rate 16, height 6' 1 (1.854 m), weight 65.6 kg, SpO2 97%. Constitutional: No distress . Vital signs reviewed. Sitting up in WC.  HEENT: right crani site CDI with staples.  EOMI, oral membranes moist Neck: supple Cardiovascular: RRR without murmur. No JVD    Respiratory/Chest: CTA Bilaterally without wheezes or rales. Normal effort    GI/Abdomen: BS +, non-tender, non-distended Ext: no clubbing, cyanosis, or edema Psych: pleasant and cooperative  Skin: C/D/I. No apparent lesions.  Staples intact along.  Well-approximated.  Neurologic exam:    Pt alert and oriented x 3. Right gaze preference.   mild left central VII.  MMT: RUE and RLE grossly 5/5 prox to  distal.  LUE 4 to 4+ delt, biceps, triceps, wrist and HI. LLE 4/5 HF, KE and 2-3/5 ADF/PF.  Fairly stable. Has activation issues at times d/t neglect. Sensation appears grossly intact to LT and pain in all 4's. DTR's 1+.  No apparent ataxia No apparent resting tone. MSK: Neck fairly neutral today. Turns head sl to right d/t left inattention.--unchanged  Physical exam unchanged from the above on reexamination 08/07/24    Assessment/Plan: 1. Functional deficits which require 3+ hours per day of interdisciplinary therapy in a comprehensive inpatient rehab setting. Physiatrist is providing close team supervision and 24 hour management of active medical problems listed below. Physiatrist and rehab team continue to assess barriers to discharge/monitor patient progress toward functional and medical goals  Care Tool:  Bathing    Body parts bathed by patient: Left arm, Chest, Abdomen, Front perineal area, Buttocks, Right upper leg, Left upper leg, Face   Body parts bathed by helper: Right lower leg, Left lower leg Body parts n/a: Right arm   Bathing assist Assist Level: Maximal Assistance - Patient 24 - 49%     Upper Body Dressing/Undressing Upper body dressing  What is the patient wearing?: Pull over shirt    Upper body assist Assist Level: Maximal Assistance - Patient 25 - 49%    Lower Body Dressing/Undressing Lower body dressing      What is the patient wearing?: Underwear/pull up, Pants     Lower body assist Assist for lower body dressing: Total Assistance - Patient < 25%     Toileting Toileting    Toileting assist Assist for toileting: Maximal Assistance - Patient 25 - 49%     Transfers Chair/bed transfer  Transfers assist     Chair/bed transfer assist level: Maximal Assistance - Patient 25 - 49%     Locomotion Ambulation   Ambulation assist   Ambulation activity did not occur: Safety/medical concerns (2/2 strong posture deficit and fatigue)           Walk 10 feet activity   Assist  Walk 10 feet activity did not occur: Safety/medical concerns (2/2 strong posture deficit and fatigue)        Walk 50 feet activity   Assist Walk 50 feet with 2 turns activity did not occur: Safety/medical concerns (2/2 strong posture deficit and fatigue)         Walk 150 feet activity   Assist Walk 150 feet activity did not occur: Safety/medical concerns (2/2 strong posture deficit and fatigue)         Walk 10 feet on uneven surface  activity   Assist Walk 10 feet on uneven surfaces activity did not occur: Safety/medical concerns (2/2 strong posture deficit and fatigue)         Wheelchair     Assist Is the patient using a wheelchair?: Yes Type of Wheelchair: Manual    Wheelchair assist level: Dependent - Patient 0% Max wheelchair distance: 331ft    Wheelchair 50 feet with 2 turns activity    Assist        Assist Level: Dependent - Patient 0%   Wheelchair 150 feet activity     Assist      Assist Level: Dependent - Patient 0%   Blood pressure 103/81, pulse 76, temperature 97.8 F (36.6 C), temperature source Oral, resp. rate 16, height 6' 1 (1.854 m), weight 65.6 kg, SpO2 97%.  Medical Problem List and Plan: 1. 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 Janjua 9/8             -patient may  shower             -ELOS/Goals: 12-14 days, supervision to min assist with self-care and mobility, mod I to supervision with cognition  -Continue CIR therapies including PT, OT, and SLP  - 10/8 DC   - 9/16: On/off confusion for night shift; telesitter started yesterday. Poor proprioception in LUE and LLE; difficulty with L sided attention. Max A LBD, Mod-Max A transfers, making very good progress. Mod A for bed mobility and transfers, walked 50 ft with Min A x2. L ataxia. Perseverative. Mod-severe issues with memory and problem solving. L oral pocketing.    - Will  need note for driving restrictions   - unsure of f/u for biopsy--on chart review, grade 4 glioma, awaiting wild typing; no consultation to neuro-oncology yet. Will discuss with family need for final results before chemo/radiation and setup with neuro-onc on discharge.    2.  Antithrombotics: -DVT/anticoagulation:  Mechanical:  Antiembolism stockings, knee (TED hose) Bilateral lower extremities Sequential compression devices, below knee Bilateral lower extremities             -  antiplatelet therapy: N/A 3. Pain Management: Tylenol , Fioricet as needed 4. Mood/Behavior/Sleep: LCSW to follow for evaluation and support when available.              -antipsychotic agents: N/A   - 9/16: Started on telesitter overnight for impulsivity; will start sleep log + trazodone  50 mg at bedtime PRN  5. Neuropsych/cognition: This patient is not capable of making decisions on his own behalf.   - 9/16: start ritalin  5 mg BID for attention and lethargy  6. Skin/Wound Care: Routine pressure-relief measures             -Per neurosurgery staple removal planned for this week (POD #10 9/18).   7. Fluids/Electrolytes/Nutrition: Monitor intake and output and routine follow-up labs in a.m.              - on regular/ thins diet             - GERD: continue PPI 8.  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 Janjua on 9/12 decrease to 2 mg q8h indefinitely until radiation treatment begins  9.  Impaired glucose tolerance: Secondary to steroids CBGs have been below 180 has not required coverage.              - CBG monitoring was every 4 hours now 2x daily --has been stable, continue to hold SSI   - 9/15: CBG remain between 100 and 150; can stop CBGs Recent Labs    08/06/24 0551 08/06/24 1749 08/07/24 0510  GLUCAP 151* 151* 149*     10.  Constipation: pt had bm yesterday -senokot-s 1 tab at bedtime -miralax  prn              Last bowel movement 9-14, medium  11.  Leukocytosis.  likely secondary to Decadron . check urine and chest x-ray today to screen for secondary causes.  Low threshold for blood cultures and broad-spectrum antibiotics if he develops vital instability.  - 9-14: Urinalysis clear, chest x-ray with no acute process.  Vitally stable, afebrile.  Trend  9/15 sl increase to 15k today. Pt afebrile, no s/s infection. UA/CXR as above  12.  Right neck tone/L hemineglect.  Start baclofen  5 mg 3 times daily.  Not currently bothersome, but want to prevent creasing and skin breakdown if possible.  - 9-14: Head positioning much better today, likely more related to hemineglect.  Move baclofen  to 3 times daily as needed.  9/15 pt fairly neutral today--left inattention is biggest factor LOS: 4 days A FACE TO FACE EVALUATION WAS PERFORMED  Joesph JAYSON Likes 08/07/2024, 9:20 AM

## 2024-08-07 NOTE — Progress Notes (Signed)
 Patient ID: Joshua Harmon, male   DOB: Mar 21, 1961, 63 y.o.   MRN: [id number]  SW met with pt and pt s/o [name] to provide updates from team conference, and d/c date 10/8. SW shared financial navigator- Antonio screened pt and will follow-up. SW will continue to provide updates.  *Pt Medicaid application completed, and will be submitted.   [name] Jude, MSW, LCSW Office: [phone] Cell: [phone] Fax: [phone]

## 2024-08-08 DIAGNOSIS — D496 Neoplasm of unspecified behavior of brain: Secondary | ICD-10-CM | POA: Diagnosis not present

## 2024-08-08 NOTE — Progress Notes (Signed)
 Physical Therapy Session Note  Patient Details  Name: Joshua Harmon MRN: [id number] Date of Birth: Sep 13, 1961  Today's Date: 08/08/2024 PT Individual Time: 1346-1415 PT Individual Time Calculation (min): 29 min   Short Term Goals: Week 1:  PT Short Term Goal 1 (Week 1): Pt will complete bed mobility with MinA PT Short Term Goal 2 (Week 1): Pt will complete SPT using MinA + LRAD PT Short Term Goal 3 (Week 1): Pt will complete 20 ft amb using modA + LRAD PT Short Term Goal 4 (Week 1): Pt will initiate stair training  Skilled Therapeutic Interventions/Progress Updates:     Pt received seated in tilt in space WC and agrees to therapy. No complaint of pain. Pt appears to be more amenable to therapy and less perseverative on leaving this session relative to AM session. WC transport to gym for time management. Pt performs sit to stand with cues for hand placement and sequencing. Pt ambulates x75' with modA +2 HHA with cues for upright posture and lateral weight shifting, as well as increasing stride width and sequencing for turns and for transition back to Nustep. Pt completes Nustep for reciprocal coordination and endurance training. Pt copmletes x12:00 at workload of 4 with average steps per minute ~45. PT provides cues for hand and foot placement and attending to LUE to provide visual feedback and improved NM control. Pt requires occasional cueing to regain grip on Lt side, and PT also utilizes leg stabilizer on LLE to maintain Lt hip in neutral rotation to prevent pt's tendency for internal rotation and adduction. Pt performs stand step back to [facility] with modA +2 and cues for sequencing, weight shifting, and safety. Left seated in WC with all needs within reach.    Therapy Documentation Precautions:  Precautions Precautions: Fall Recall of Precautions/Restrictions: Impaired Precaution/Restrictions Comments: Poor awareness, L lean, ataxic Restrictions Weight Bearing Restrictions Per Provider  Order: No   Therapy/Group: Individual Therapy  [name], PT, DPT 08/08/2024, 4:09 PM

## 2024-08-08 NOTE — Plan of Care (Signed)
  Problem: Consults Goal: RH GENERAL PATIENT EDUCATION Description: See Patient Education module for education specifics. Outcome: Progressing   Problem: RH BOWEL ELIMINATION Goal: RH STG MANAGE BOWEL WITH ASSISTANCE Description: STG Manage Bowel with supervision-min  Assistance. Outcome: Progressing   Problem: RH BLADDER ELIMINATION Goal: RH STG MANAGE BLADDER WITH ASSISTANCE Description: STG Manage Bladder With supervision-min Assistance Outcome: Progressing   Problem: RH SKIN INTEGRITY Goal: RH STG SKIN FREE OF INFECTION/BREAKDOWN Description: Manage skin free of infection with supervision- min assistance  Outcome: Progressing   Problem: RH SAFETY Goal: RH STG ADHERE TO SAFETY PRECAUTIONS W/ASSISTANCE/DEVICE Description: STG Adhere to Safety Precautions With supervision-min Assistance/Device. Outcome: Progressing   Problem: RH PAIN MANAGEMENT Goal: RH STG PAIN MANAGED AT OR BELOW PT'S PAIN GOAL Description: < 4 w/ prns Outcome: Progressing   Problem: RH KNOWLEDGE DEFICIT GENERAL Goal: RH STG INCREASE KNOWLEDGE OF SELF CARE AFTER HOSPITALIZATION Description: Manage increase knowledge of self care after hospitalization with supervision- min assistance from partner using educational materials provided Outcome: Progressing   Problem: RH COGNITION-NURSING Goal: RH STG USES MEMORY AIDS/STRATEGIES W/ASSIST TO PROBLEM SOLVE Description: STG Uses Memory Aids/Strategies With min- supervision Assistance to Problem Solve. Outcome: Progressing   Problem: RH KNOWLEDGE DEFICIT BRAIN INJURY Goal: RH STG INCREASE KNOWLEDGE OF SELF CARE AFTER BRAIN INJURY Description: Increase knowledge of self care after brain injury with min-supervision assistance Outcome: Progressing

## 2024-08-08 NOTE — Progress Notes (Signed)
 Physical Therapy Session Note  Patient Details  Name: Joshua Harmon MRN: [id number] Date of Birth: 08-Oct-1961  Today's Date: 08/08/2024 PT Individual Time: 1000-1100 PT Individual Time Calculation (min): 60 min   Short Term Goals: Week 1:  PT Short Term Goal 1 (Week 1): Pt will complete bed mobility with MinA PT Short Term Goal 2 (Week 1): Pt will complete SPT using MinA + LRAD PT Short Term Goal 3 (Week 1): Pt will complete 20 ft amb using modA + LRAD PT Short Term Goal 4 (Week 1): Pt will initiate stair training  Skilled Therapeutic Interventions/Progress Updates:   Pt received upright in TIS WC, required max encouragement to participate in therapy for first half of session, stating I don't need any help. I just wanna go home. I can't afford this. Pt denied any pain throughout session.   Pt wheeled to main gym and required further encouragement and education on purpose of CIR. Pt agreeable to participate in floor transfer exercise to improve functional activity tolerance, decrease fall risk, and improve coordination. Pt required maxA +2 for standing>tall kneeling>quadruped>side sitting>long sitting>supine. Pt reiterated several times that he can get back up independently, but was unable to progress past long sitting, required modA to stabilize LUE. Pt demonstrated decreased coordination and decreased strength in LUE/LLE. Pt realized he would not be able to independently get up from floor, pt agreed to assistance from therapy, required maxA + 2 to come to standing. Pt unable to follow commands to reverse above mentioned order, pulled up to standing from tall kneel w/ total A. Throughout transfer, pt demonstrated little to no safety awareness, ended up off the mat several times, head under the mat table, etc.  Pt wheeled outside to improve mood/affect and commence community reintegration. Pt mood improved, and pt participated in simple BLE exercises to improve pt awareness of strength deficits  in LUE/LLE w/ minimal success. Pt wheeled back inside w/ total A and back to room. Pt handoff to nsg staff in room, wc brake locked.    Therapy Documentation Precautions:  Precautions Precautions: Fall Recall of Precautions/Restrictions: Impaired Precaution/Restrictions Comments: Poor awareness, L lean, ataxic Restrictions Weight Bearing Restrictions Per Provider Order: No   Therapy/Group: Individual Therapy  Oneil Grumbles 08/08/2024, 11:51 AM

## 2024-08-08 NOTE — Progress Notes (Signed)
 PROGRESS NOTE   Subjective/Complaints:  No events overnight.  No acute complaints, doing very well. Tachycardia yesterday afternoon after starting ritalin ; mild, low 100s. Otherwise stable.  Patient denies any symptoms.   ROS: Patient denies fever, rash, sore throat, blurred vision, dizziness, nausea, vomiting, diarrhea, cough, shortness of breath or chest pain, joint or back/neck pain,   or mood change.   Objective:   No results found.  Recent Labs    08/06/24 0555  WBC 15.0*  HGB 15.0  HCT 43.8  PLT 379   Recent Labs    08/06/24 0555  NA 134*  K 4.4  CL 98  CO2 25  GLUCOSE 123*  BUN 16  CREATININE 0.90  CALCIUM 9.0     Intake/Output Summary (Last 24 hours) at 08/08/2024 0817 Last data filed at 08/08/2024 9392 Gross per 24 hour  Intake 780 ml  Output 225 ml  Net 555 ml        Physical Exam: Vital Signs Blood pressure 111/86, pulse 79, temperature (!) 97.4 F (36.3 C), resp. rate 18, height 6' 1 (1.854 m), weight 65.6 kg, SpO2 94%. Constitutional: No distress . Vital signs reviewed.  Sitting up in bed. HEENT: right crani site CDI with staples.  EOMI, oral membranes moist Neck: supple Cardiovascular: RRR without murmur. No JVD    Respiratory/Chest: CTA Bilaterally without wheezes or rales. Normal effort    GI/Abdomen: BS +, non-tender, non-distended Ext: no clubbing, cyanosis, or edema Psych: pleasant and cooperative  Skin: C/D/I. No apparent lesions.  Staples intact along.  Well-approximated.  Neurologic exam:    Pt alert and oriented x 3. Right gaze preference.   mild left central VII.  MMT: RUE and RLE grossly 5/5 prox to distal.  LUE 4 to 4+ delt, biceps, triceps, wrist and HI. LLE 4/5 HF, KE and 2-3/5 ADF/PF.   Sensation appears grossly intact to LT and pain in all 4's.  DTR's 1+.  No apparent ataxia No apparent resting tone. MSK: Turns head sl to right d/t left inattention; can get back  to neutral with significant stimulation.--unchanged  Physical exam unchanged from the above on reexamination 08/08/24    Assessment/Plan: 1. Functional deficits which require 3+ hours per day of interdisciplinary therapy in a comprehensive inpatient rehab setting. Physiatrist is providing close team supervision and 24 hour management of active medical problems listed below. Physiatrist and rehab team continue to assess barriers to discharge/monitor patient progress toward functional and medical goals  Care Tool:  Bathing    Body parts bathed by patient: Left arm, Chest, Abdomen, Front perineal area, Buttocks, Right upper leg, Left upper leg, Face   Body parts bathed by helper: Right lower leg, Left lower leg Body parts n/a: Right arm   Bathing assist Assist Level: Maximal Assistance - Patient 24 - 49%     Upper Body Dressing/Undressing Upper body dressing   What is the patient wearing?: Pull over shirt    Upper body assist Assist Level: Maximal Assistance - Patient 25 - 49%    Lower Body Dressing/Undressing Lower body dressing      What is the patient wearing?: Underwear/pull up, Pants     Lower body  assist Assist for lower body dressing: Total Assistance - Patient < 25%     Toileting Toileting    Toileting assist Assist for toileting: Maximal Assistance - Patient 25 - 49%     Transfers Chair/bed transfer  Transfers assist     Chair/bed transfer assist level: Maximal Assistance - Patient 25 - 49%     Locomotion Ambulation   Ambulation assist   Ambulation activity did not occur: Safety/medical concerns (2/2 strong posture deficit and fatigue)          Walk 10 feet activity   Assist  Walk 10 feet activity did not occur: Safety/medical concerns (2/2 strong posture deficit and fatigue)        Walk 50 feet activity   Assist Walk 50 feet with 2 turns activity did not occur: Safety/medical concerns (2/2 strong posture deficit and fatigue)          Walk 150 feet activity   Assist Walk 150 feet activity did not occur: Safety/medical concerns (2/2 strong posture deficit and fatigue)         Walk 10 feet on uneven surface  activity   Assist Walk 10 feet on uneven surfaces activity did not occur: Safety/medical concerns (2/2 strong posture deficit and fatigue)         Wheelchair     Assist Is the patient using a wheelchair?: Yes Type of Wheelchair: Manual    Wheelchair assist level: Dependent - Patient 0% Max wheelchair distance: 369ft    Wheelchair 50 feet with 2 turns activity    Assist        Assist Level: Dependent - Patient 0%   Wheelchair 150 feet activity     Assist      Assist Level: Dependent - Patient 0%   Blood pressure 111/86, pulse 79, temperature (!) 97.4 F (36.3 C), resp. rate 18, height 6' 1 (1.854 m), weight 65.6 kg, SpO2 94%.  Medical Problem List and Plan: 1. 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 Janjua 9/8             -patient may  shower             -ELOS/Goals: 12-14 days, supervision to min assist with self-care and mobility, mod I to supervision with cognition  -Continue CIR therapies including PT, OT, and SLP  - 10/8 DC   - 9/16: On/off confusion for night shift; telesitter started yesterday. Poor proprioception in LUE and LLE; difficulty with L sided attention. Max A LBD, Mod-Max A transfers, making very good progress. Mod A for bed mobility and transfers, walked 50 ft with Min A x2. L ataxia. Perseverative. Mod-severe issues with memory and problem solving. L oral pocketing.    - Will need note for driving restrictions   - unsure of f/u for biopsy--on chart review, grade 4 glioma, awaiting wild typing; no consultation to neuro-oncology yet. Will discuss with family need for final results before chemo/radiation and setup with neuro-onc on discharge--Dr. Eward NP will follow-up with patient tomorrow 9-18   2.   Antithrombotics: -DVT/anticoagulation:  Mechanical:  Antiembolism stockings, knee (TED hose) Bilateral lower extremities Sequential compression devices, below knee Bilateral lower extremities             -antiplatelet therapy: N/A 3. Pain Management: Tylenol , Fioricet as needed 4. Mood/Behavior/Sleep: LCSW to follow for evaluation and support when available.              -antipsychotic agents: N/A   -  9/16: Started on telesitter overnight for impulsivity; will start sleep log + trazodone  50 mg at bedtime PRN--sleep log appropriate  5. Neuropsych/cognition: This patient is not capable of making decisions on his own behalf.   - 9/16: start ritalin  5 mg BID for attention and lethargy--continue trial  6. Skin/Wound Care: Routine pressure-relief measures             -Per neurosurgery staple removal planned for this week (POD #10 9/18).   7. Fluids/Electrolytes/Nutrition: Monitor intake and output and routine follow-up labs in a.m.              - on regular/ thins diet             - GERD: continue PPI 8.  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 Janjua on 9/12 decrease to 2 mg q8h indefinitely until radiation treatment begins  9.  Impaired glucose tolerance: Secondary to steroids CBGs have been below 180 has not required coverage.              - CBG monitoring was every 4 hours now 2x daily --has been stable, continue to hold SSI   - 9/15: CBG remain between 100 and 150; can stop CBGs Recent Labs    08/06/24 0551 08/06/24 1749 08/07/24 0510  GLUCAP 151* 151* 149*     10.  Constipation: pt had bm yesterday -senokot-s 1 tab at bedtime -miralax  prn             Last bowel movement 9-14, medium  11.  Leukocytosis.  likely secondary to Decadron . check urine and chest x-ray today to screen for secondary causes.  Low threshold for blood cultures and broad-spectrum antibiotics if he  develops vital instability.  - 9-14: Urinalysis clear, chest x-ray with no acute process.  Vitally stable, afebrile.  Trend  9/15 sl increase to 15k today. Pt afebrile, no s/s infection. UA/CXR as above  12.  Right neck tone/L hemineglect.  Start baclofen  5 mg 3 times daily.  Not currently bothersome, but want to prevent creasing and skin breakdown if possible.  - 9-14: Head positioning much better today, likely more related to hemineglect.  Move baclofen  to 3 times daily as needed.  9/15 pt fairly neutral today--left inattention is biggest factor LOS: 5 days A FACE TO FACE EVALUATION WAS PERFORMED  Joesph JAYSON Likes 08/08/2024, 8:17 AM

## 2024-08-08 NOTE — Progress Notes (Signed)
 Occupational Therapy Session Note  Patient Details  Name: Joshua Harmon MRN: [id number] Date of Birth: [birth year]  Today's Date: 08/08/2024 OT Individual Time: 9267-9154 OT Individual Time Calculation (min): 73 min    Short Term Goals: Week 1:  OT Short Term Goal 1 (Week 1): Pt will don shirt with mod A OT Short Term Goal 2 (Week 1): Pt will complete a stand pivot transfer to the w/c with mod A OT Short Term Goal 3 (Week 1): Pt will improve LUE ataxia by using the LUE during bathing with min A OT Short Term Goal 4 (Week 1): Pt will don pants with mod A OT Short Term Goal 5 (Week 1): Pt will right trunk and head in sitting with no more than mod facilitation  Skilled Therapeutic Interventions/Progress Updates:    Pt received supine with no c/o pain, agreeable to OT session starting with shower. He came to EOB with CGA today! Still requiring cueing for sequencing- to reach the RUE across body to the L bed rail. Stand pivot to the TIS w/c with min A- much improved postural control. Graded shower up by switching BSC to TTB- removing lateral supports to challenge sitting balance during bathing. Stand pivot into shower with mod A, requiring placement of the LUE and cueing for L attention. He completed UB bathing with set up of wearable wash rag over LUE and then mod cueing for visual attention to limb and forced use during bathing. Increased time provided for pt to work through ataxia. He then stood with min A and heavy use of grab bars anteriorly and placement of the LUE on the grab bar for peri hygiene. He was able to wash anterior/posterior in standing with OT providing min A at the trunk and HOH to guide the LUE to be incorporated into bathing. He completed transfer back to the w/c following with mod A. Oral care with forced use of the LUE. Pt was taken via w/c to the therapy gym for time management. He transferred to [facility] with mod A. Wedge placed under him while sitting to facilitate more anterior  pelvic tilt for improved trunk extension and core activation. He worked on [employer] and coordination based training- reaching for targets in an open chain and controlling grasp/release. OT providing min facilitation proximally as well as continuous cueing for L visual attention to task. He is demonstrating improvement already but is still severely ataxia, overshooting 90% of the time. He then completed 50 ft of functional mobility with B HHA, mod A overall- already improved from yesterday. Performed for carryover to postural control and dynamic balance during ADLs. He returned to the TIS w/c and to his room. Pt was left sitting up in the wheelchair with all needs met, chair alarm set, and call bell within reach.   Therapy Documentation Precautions:  Precautions Precautions: Fall Recall of Precautions/Restrictions: Impaired Precaution/Restrictions Comments: Poor awareness, L lean, ataxic Restrictions Weight Bearing Restrictions Per Provider Order: No  Therapy/Group: Individual Therapy  Nena VEAR Moats 08/08/2024, 8:34 AM

## 2024-08-08 NOTE — Progress Notes (Signed)
 Speech Language Pathology Daily Session Note  Patient Details  Name: Joshua Harmon MRN: [id number] Date of Birth: 1961-02-25  Today's Date: 08/08/2024 SLP Individual Time: 1415-1500 SLP Individual Time Calculation (min): 45 min  Short Term Goals: Week 1: SLP Short Term Goal 1 (Week 1): Pt will utilize compensatory memory strategies as needed to recall recent/relevant info w/ minA SLP Short Term Goal 2 (Week 1): Pt will sustain attention to task in a mildly distracting environment for ~10 mins given minA SLP Short Term Goal 3 (Week 1): Pt will solve functional problems w/ minA SLP Short Term Goal 4 (Week 1): Pt will improve L visual attention to minA SLP Short Term Goal 5 (Week 1): Pt will recall 2 changes in cognition given modA  Skilled Therapeutic Interventions:   Pt greeted in his room - up in his TIS WC after PT tx session. He was agreeable to tx tasks targeting cognition. SLP facilitated orientation review. He required modA to utilize calendar and provide accurate date. Of note, he demonstrates no carryover of date and continues to only state dates on the R (d/t L visual inattention). He then completed a money management task via menu. He required modA for L visual attention, maxA for organization and attention to detail, and totalA error awareness. Calculations WFL. He also received a phone call re insurance claim and benefited from maxA to recall recent dates/medical hx. He also required modA for impulsivity, as he often spoke over the Lobby[employer], which then resulted in need for multiple repetitions. His significant other was provided w/ updates after the call, as well as update re progress thus far. During conversation, he required errorless learning for awareness given complete lack of insight. At the end of tx tasks, he was left in his [facility] w/ the alarm set and call light within reach. Recommend cont ST per POC.   Pain Pain Assessment Pain Scale: 0-10 Pain Score: 0-No  pain  Therapy/Group: Individual Therapy  [name] 08/08/2024, 4:24 PM

## 2024-08-09 ENCOUNTER — Encounter (HOSPITAL_COMMUNITY): Payer: Self-pay | Admitting: Pathology

## 2024-08-09 DIAGNOSIS — D496 Neoplasm of unspecified behavior of brain: Secondary | ICD-10-CM | POA: Diagnosis not present

## 2024-08-09 NOTE — Progress Notes (Signed)
 Physical Therapy Session Note  Patient Details  Name: Joshua Harmon MRN: [id number] Date of Birth: Feb 27, 1961  Today's Date: 08/09/2024 PT Individual Time: 0917-1000 PT Individual Time Calculation (min): 43 min   Short Term Goals: Week 1:  PT Short Term Goal 1 (Week 1): Pt will complete bed mobility with MinA PT Short Term Goal 2 (Week 1): Pt will complete SPT using MinA + LRAD PT Short Term Goal 3 (Week 1): Pt will complete 20 ft amb using modA + LRAD PT Short Term Goal 4 (Week 1): Pt will initiate stair training  Skilled Therapeutic Interventions/Progress Updates:     Pt received semi reclind in bed and agrees to therapy. Reports need to void bladder and bowels. Pt performs supine to sit with modA and cues for flexion at hips and knees to prevent extensor tone from sliding hips toward edge of bed. Pt performs sit to stand in [place] with minA and cues for LUE placement and attention to Lt side. Stedy transfer to toilet with cues for sequencing and safety. Pt continent of bowel and bladder and PT provides assistance for pericare with pt standing in [place] with CGA and cues for safety. Pt positioned in high perch on stedy while PT cues pt to wash face and brush teeth at sink to work on balance, coordination, and activity tolerance. PT assists to don pants and shit and pt transfer to til in space WC with stedy. WC transport to gym. Stand step to Nustep with modA Rt HHA and cues for body mechanics, sequencing, and positioning. Pt completes Nustep for reciprocal coordination training and endurance training. Pt completes 9:00 with multiple rest breaks, on workload of 5 with LLE stabilizer to prevent adduction and internal rotation of Lt hip. PT provides cues for Lt sided attention and completing full available ROM. Stand pivot back to [facility] with modA and same cues and assistance. Left seated with all needs within reach.   Therapy Documentation Precautions:  Precautions Precautions: Fall Recall of  Precautions/Restrictions: Impaired Precaution/Restrictions Comments: Poor awareness, L lean, ataxic Restrictions Weight Bearing Restrictions Per Provider Order: No   Therapy/Group: Individual Therapy  [name], PT, DPT 08/09/2024, 5:05 PM

## 2024-08-09 NOTE — Progress Notes (Shared)
 Speech Language Pathology Daily Session Note  Patient Details  Name: PIERCE BAROCIO MRN: [id number] Date of Birth: [birth year]  Today's Date: 08/09/2024 SLP Individual Time: 1015-1100 SLP Individual Time Calculation (min): 45 min  Short Term Goals: Week 1: SLP Short Term Goal 1 (Week 1): Pt will utilize compensatory memory strategies as needed to recall recent/relevant info w/ minA SLP Short Term Goal 2 (Week 1): Pt will sustain attention to task in a mildly distracting environment for ~10 mins given minA SLP Short Term Goal 3 (Week 1): Pt will solve functional problems w/ minA SLP Short Term Goal 4 (Week 1): Pt will improve L visual attention to minA SLP Short Term Goal 5 (Week 1): Pt will recall 2 changes in cognition given modA  Skilled Therapeutic Interventions: SLP conducted skilled therapy session targeting *** goals. ***. Patient was left in room with call bell in reach and alarm set. SLP will continue to target goals per plan of care.        Pain    Therapy/Group: Individual Therapy  Ticia Virgo A Noel Rodier 08/09/2024, 11:12 AM

## 2024-08-09 NOTE — Plan of Care (Signed)
  Problem: Consults Goal: RH GENERAL PATIENT EDUCATION Description: See Patient Education module for education specifics. Outcome: Progressing   Problem: RH BOWEL ELIMINATION Goal: RH STG MANAGE BOWEL WITH ASSISTANCE Description: STG Manage Bowel with supervision-min  Assistance. Outcome: Progressing   Problem: RH BLADDER ELIMINATION Goal: RH STG MANAGE BLADDER WITH ASSISTANCE Description: STG Manage Bladder With supervision-min Assistance Outcome: Progressing   Problem: RH SKIN INTEGRITY Goal: RH STG SKIN FREE OF INFECTION/BREAKDOWN Description: Manage skin free of infection with supervision- min assistance  Outcome: Progressing   Problem: RH SAFETY Goal: RH STG ADHERE TO SAFETY PRECAUTIONS W/ASSISTANCE/DEVICE Description: STG Adhere to Safety Precautions With supervision-min Assistance/Device. Outcome: Progressing   Problem: RH PAIN MANAGEMENT Goal: RH STG PAIN MANAGED AT OR BELOW PT'S PAIN GOAL Description: < 4 w/ prns Outcome: Progressing   Problem: RH KNOWLEDGE DEFICIT GENERAL Goal: RH STG INCREASE KNOWLEDGE OF SELF CARE AFTER HOSPITALIZATION Description: Manage increase knowledge of self care after hospitalization with supervision- min assistance from partner using educational materials provided Outcome: Progressing   Problem: RH COGNITION-NURSING Goal: RH STG USES MEMORY AIDS/STRATEGIES W/ASSIST TO PROBLEM SOLVE Description: STG Uses Memory Aids/Strategies With min- supervision Assistance to Problem Solve. Outcome: Progressing   Problem: RH KNOWLEDGE DEFICIT BRAIN INJURY Goal: RH STG INCREASE KNOWLEDGE OF SELF CARE AFTER BRAIN INJURY Description: Increase knowledge of self care after brain injury with min-supervision assistance Outcome: Progressing

## 2024-08-09 NOTE — Progress Notes (Addendum)
 Speech Language Pathology Daily Session Note  Patient Details  Name: Joshua Harmon MRN: [id number] Date of Birth: Jul 29, 1961  Today's Date: 08/09/2024 SLP Individual Time: 1330-1430 SLP Individual Time Calculation (min): 60 min  Short Term Goals: Week 1: SLP Short Term Goal 1 (Week 1): Pt will utilize compensatory memory strategies as needed to recall recent/relevant info w/ minA SLP Short Term Goal 2 (Week 1): Pt will sustain attention to task in a mildly distracting environment for ~10 mins given minA SLP Short Term Goal 3 (Week 1): Pt will solve functional problems w/ minA SLP Short Term Goal 4 (Week 1): Pt will improve L visual attention to minA SLP Short Term Goal 5 (Week 1): Pt will recall 2 changes in cognition given modA  Skilled Therapeutic Interventions:   Pt greeted at bedside for tx targeting cognition. He was very pleasant and cooperative throughout tasks, but continues to require cueing d/t impulsivity. During orientation review, he remained perseverative on the date being September 27th or near. However, once provided w/ minA cues to utilize his calendar,  he was able to recall correct date. Adequate orientation to place and reason for stay. He then completed a written decoding task targeting L visual attention, selective attention, working memory, and problem solving. He benefited from maxA overall. Lack of error awareness continues. A provider from oncology arrived and SLP provided modA cues for attention and impulsivity, and maxA for memory and reasoning throughout conversation re recent medical hx and care plan. At the end of tx tasks, he was left in his [facility] w/ the alarm set and call light within reach. Recommend cont ST per POC.   Pain  None reported  Therapy/Group: Individual Therapy  [name] 08/09/2024, 2:50 PM

## 2024-08-09 NOTE — Progress Notes (Signed)
 PROGRESS NOTE   Subjective/Complaints:  No events overnight.  No acute complaints.  Very upbeat and verbose this a.m.; working hard with speech therapy. Vitals stable Continued very poor insight and awareness; no appreciable effect from ritalin  yet.  ROS: Patient denies fever, rash, sore throat, blurred vision, dizziness, nausea, vomiting, diarrhea, cough, shortness of breath or chest pain, joint or back/neck pain,   or mood change.   Objective:   No results found.  No results for input(s): WBC, HGB, HCT, PLT in the last 72 hours.  No results for input(s): NA, K, CL, CO2, GLUCOSE, BUN, CREATININE, CALCIUM in the last 72 hours.    Intake/Output Summary (Last 24 hours) at 08/09/2024 0919 Last data filed at 08/09/2024 0725 Gross per 24 hour  Intake 360 ml  Output 300 ml  Net 60 ml        Physical Exam: Vital Signs Blood pressure (!) 109/97, pulse 70, temperature 98 F (36.7 C), temperature source Oral, resp. rate 18, height 6' 1 (1.854 m), weight 65.6 kg, SpO2 100%. Constitutional: No distress . Vital signs reviewed.  Sitting up in bedside wheelchair. HEENT: right crani site CDI with staples.  EOMI, oral membranes moist Neck: supple Cardiovascular: RRR without murmur. No JVD    Respiratory/Chest: CTA Bilaterally without wheezes or rales. Normal effort    GI/Abdomen: BS +, non-tender, non-distended Ext: no clubbing, cyanosis, or edema Psych: pleasant and cooperative  Skin: C/D/I. No apparent lesions.  Staples intact along.  Well-approximated.  Neurologic exam:    Pt alert and oriented x 3. Right gaze preference./Left hemineglect  mild left central VII.  MMT: RUE and RLE grossly 5/5 prox to distal.  LUE 4 to 4+ delt, biceps, triceps, wrist and HI. LLE 4/5 HF, KE and 2-3/5 ADF/PF.  --Unchanged Sensation appears grossly intact to LT and pain in all 4's.  DTR's 1+.  No apparent ataxia No  apparent resting tone. MSK: Much improved range of motion of neck with bilateral rotation, sidebending.   Assessment/Plan: 1. Functional deficits which require 3+ hours per day of interdisciplinary therapy in a comprehensive inpatient rehab setting. Physiatrist is providing close team supervision and 24 hour management of active medical problems listed below. Physiatrist and rehab team continue to assess barriers to discharge/monitor patient progress toward functional and medical goals  Care Tool:  Bathing    Body parts bathed by patient: Left arm, Chest, Abdomen, Front perineal area, Buttocks, Right upper leg, Left upper leg, Face   Body parts bathed by helper: Right lower leg, Left lower leg Body parts n/a: Right arm   Bathing assist Assist Level: Maximal Assistance - Patient 24 - 49%     Upper Body Dressing/Undressing Upper body dressing   What is the patient wearing?: Pull over shirt    Upper body assist Assist Level: Maximal Assistance - Patient 25 - 49%    Lower Body Dressing/Undressing Lower body dressing      What is the patient wearing?: Underwear/pull up, Pants     Lower body assist Assist for lower body dressing: Total Assistance - Patient < 25%     Toileting Toileting    Toileting assist Assist for toileting: Maximal Assistance -  Patient 25 - 49%     Transfers Chair/bed transfer  Transfers assist     Chair/bed transfer assist level: Maximal Assistance - Patient 25 - 49%     Locomotion Ambulation   Ambulation assist   Ambulation activity did not occur: Safety/medical concerns (2/2 strong posture deficit and fatigue)          Walk 10 feet activity   Assist  Walk 10 feet activity did not occur: Safety/medical concerns (2/2 strong posture deficit and fatigue)        Walk 50 feet activity   Assist Walk 50 feet with 2 turns activity did not occur: Safety/medical concerns (2/2 strong posture deficit and fatigue)         Walk 150  feet activity   Assist Walk 150 feet activity did not occur: Safety/medical concerns (2/2 strong posture deficit and fatigue)         Walk 10 feet on uneven surface  activity   Assist Walk 10 feet on uneven surfaces activity did not occur: Safety/medical concerns (2/2 strong posture deficit and fatigue)         Wheelchair     Assist Is the patient using a wheelchair?: Yes Type of Wheelchair: Manual    Wheelchair assist level: Dependent - Patient 0% Max wheelchair distance: 36ft    Wheelchair 50 feet with 2 turns activity    Assist        Assist Level: Dependent - Patient 0%   Wheelchair 150 feet activity     Assist      Assist Level: Dependent - Patient 0%   Blood pressure (!) 109/97, pulse 70, temperature 98 F (36.7 C), temperature source Oral, resp. rate 18, height 6' 1 (1.854 m), weight 65.6 kg, SpO2 100%.  Medical Problem List and Plan: 1. 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 Janjua 9/8             -patient may  shower             -ELOS/Goals: 12-14 days, supervision to min assist with self-care and mobility, mod I to supervision with cognition  -Continue CIR therapies including PT, OT, and SLP  - 10/8 DC   - 9/16: On/off confusion for night shift; telesitter started yesterday. Poor proprioception in LUE and LLE; difficulty with L sided attention. Max A LBD, Mod-Max A transfers, making very good progress. Mod A for bed mobility and transfers, walked 50 ft with Min A x2. L ataxia. Perseverative. Mod-severe issues with memory and problem solving. L oral pocketing.    - Will need note for driving restrictions   - unsure of f/u for biopsy--on chart review, grade 4 glioma, awaiting wild typing; no consultation to neuro-oncology yet. Will discuss with family need for final results before chemo/radiation and setup with neuro-onc on discharge-  -9-18: Dr. Eward NP will follow-up with patient  tomorrow, they will be discussing him at tumor board this coming Tuesday.  Unsure of plan at this time, as insufficient tissue to complete special typing of tumor.  May need to facilitate early discharge if chemo/radiation therapy required urgently.   2.  Antithrombotics: -DVT/anticoagulation:  Mechanical:  Antiembolism stockings, knee (TED hose) Bilateral lower extremities Sequential compression devices, below knee Bilateral lower extremities             -antiplatelet therapy: N/A 3. Pain Management: Tylenol , Fioricet as needed 4. Mood/Behavior/Sleep: LCSW to follow for evaluation and support when available.              -  antipsychotic agents: N/A   - 9/16: Started on telesitter overnight for impulsivity; will start sleep log + trazodone  50 mg at bedtime PRN--sleep log appropriate  5. Neuropsych/cognition: This patient is not capable of making decisions on his own behalf.   - 9/16: start ritalin  5 mg BID for attention and lethargy--continue trial, arousal much improved but concentration remains difficult  6. Skin/Wound Care: Routine pressure-relief measures             -Per neurosurgery staple removal planned for this week (POD #10 9/18)--if not already done, will DC tomorrow  7. Fluids/Electrolytes/Nutrition: Monitor intake and output and routine follow-up labs in a.m.              - on regular/ thins diet             - GERD: continue PPI 8.  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 Janjua on 9/12 decrease to 2 mg q8h indefinitely until radiation treatment begins  9.  Impaired glucose tolerance: Secondary to steroids CBGs have been below 180 has not required coverage.              - CBG monitoring was every 4 hours now 2x daily --has been stable, continue to hold SSI   - 9/15: CBG remain between 100 and 150; can stop CBGs    10.  Constipation: pt had bm yesterday -senokot-s 1 tab  at bedtime -miralax  prn             Last bowel movement 9-17, small x2  11.  Leukocytosis.  likely secondary to Decadron . check urine and chest x-ray today to screen for secondary causes.  Low threshold for blood cultures and broad-spectrum antibiotics if he develops vital instability.  - 9-14: Urinalysis clear, chest x-ray with no acute process.  Vitally stable, afebrile.  Trend  9/15 sl increase to 15k today. Pt afebrile, no s/s infection. UA/CXR as above  12.  Right neck tone/L hemineglect.  Start baclofen  5 mg 3 times daily.  Not currently bothersome, but want to prevent creasing and skin breakdown if possible.  - 9-14: Head positioning much better today, likely more related to hemineglect.  Move baclofen  to 3 times daily as needed.  9/15 pt fairly neutral today--left inattention is biggest factor  -18: Range of motion, inattention improving.  LOS: 6 days A FACE TO FACE EVALUATION WAS PERFORMED  Joshua Harmon Likes 08/09/2024, 9:19 AM

## 2024-08-09 NOTE — Progress Notes (Signed)
 Occupational Therapy Session Note  Patient Details  Name: Joshua Harmon MRN: [id number] Date of Birth: 06-01-1961  Today's Date: 08/09/2024 OT Individual Time: 1450-1530 OT Individual Time Calculation (min): 40 min    Short Term Goals: Week 1:  OT Short Term Goal 1 (Week 1): Pt will don shirt with mod A OT Short Term Goal 2 (Week 1): Pt will complete a stand pivot transfer to the w/c with mod A OT Short Term Goal 3 (Week 1): Pt will improve LUE ataxia by using the LUE during bathing with min A OT Short Term Goal 4 (Week 1): Pt will don pants with mod A OT Short Term Goal 5 (Week 1): Pt will right trunk and head in sitting with no more than mod facilitation  Skilled Therapeutic Interventions/Progress Updates:   Pt greeted sitting in TIS WC for skilled OT session with focus on LUE NMR. Pt with no reports of pain. OT offering intermediate rest breaks and positioning suggestions throughout session to address pain/fatigue and maximize participation/safety in session. Pt dependently transported from room<>main therapy gym. Pt instructed in functional reaching activity with LUE to promote attention to limb and management of ataxia, movements made through D1/D2 PNF patterns. OT provides proprioceptive input at distal joints to improve accuracy of movements. Pt performs sit<>stand with Min A with +2 assisting with brief change for time management and safety in standing. Pt remained sitting in TIS WC with LUE supported on pillow, posey belt activated.   Therapy Documentation Precautions:  Precautions Precautions: Fall Recall of Precautions/Restrictions: Impaired Precaution/Restrictions Comments: Poor awareness, L lean, ataxic Restrictions Weight Bearing Restrictions Per Provider Order: No   Therapy/Group: Individual Therapy  Nereida Habermann, OTR/L, MSOT  08/09/2024, 8:01 AM

## 2024-08-09 NOTE — Consult Note (Signed)
 [facility] CONSULT NOTE  Patient Care Team: Patient, No Pcp Per as PCP - General (General Practice)  CHIEF COMPLAINTS/PURPOSE OF CONSULTATION:  GBM  REFERRING PHYSICIAN: Dr. Emeline  HISTORY OF PRESENTING ILLNESS:  Joshua Harmon 63 y.o. male who was referred to neuro-oncology for new diagnosis of right brain tumor.  Patient reports that he began to experience unsteadiness and frequent falls, could not keep my balance for the previous 3 to 4 months.  He became very concerned and came to the ED on 07/25/2024 for further evaluation.  Imaging was done which showed a brain mass with subsequent biopsy done. Patient is seen today, awake alert and oriented.  He is a very pleasant gentleman who is sitting in a wheelchair and serves as principal historian for his medical history.  Admits to unintentional weight loss of approximately 5 pounds.  Denies current pain, and acute GI symptoms.  Also denies headaches, dizziness or lightheadedness. 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 Surgical history includes hernia repair in 2021. Family history includes father with Alzheimer's dementia.  He is not aware of anyone in his family with cancer. Social history includes tobacco use, half pack cigarettes per day although states he recently quit when he began to have all these problems.  Admits to drinking beer only, denies hard liquor.  Admits to recreational marijuana use although also states he has quit recently.  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     I have reviewed his chart and materials related to his cancer extensively and collaborated history with the patient. Summary of oncologic history is as follows: Oncology History   No history exists.    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 - Neurosurgery following - Note plans for discharge on 08/29/2024, neuro-oncology would like to see patient prior to that date in the outpatient setting. Recommend outpatient neuro-oncology follow-up next week or early the following week if feasible. - Neuro-oncology/Dr. Buckley will make further evaluation and treatment recommendations.  Bilateral lower extremity weakness - Likely multifactorial due to history of polio and secondary to brain mass - Continue supportive care  Leukocytosis - Elevated WBC 15.0 - May be secondary to steroids - Afebrile, monitor fever curve  Transaminitis - Elevated LFTs - Avoid hepatotoxic agents - Continue to monitor CMP  History of hernia repair - In 2021 - Continue follow-up with PCP/surgery as needed   MEDICAL HISTORY:  Past Medical History:  Diagnosis Date   GERD (gastroesophageal reflux disease)     SURGICAL HISTORY: Past Surgical History:  Procedure Laterality Date   APPLICATION OF CRANIAL NAVIGATION Right 07/30/2024   Procedure: COMPUTER-ASSISTED NAVIGATION, FOR CRANIAL PROCEDURE;  Surgeon: Rosslyn Dino HERO, MD;  Location: [facility] OR;  Service: Neurosurgery;  Laterality: Right;  CRANIAL NAVIGATION   LEG SURGERY Bilateral as a child   STERIOTACTIC STIMULATOR INSERTION Right 07/30/2024   Procedure: RIGHT STEREOTACTIC BIOPSY;  Surgeon: Rosslyn Dino HERO, MD;  Location: [facility] OR;  Service: Neurosurgery;  Laterality: Right;  RIGHT STERIOTACTIC BRAIN BIOPSY   XI ROBOTIC ASSISTED INGUINAL HERNIA REPAIR WITH MESH Right 02/19/2020   Procedure: XI ROBOTIC ASSISTED Right INGUINAL HERNIA REPAIR WITH MESH;  Surgeon: Jordis Laneta FALCON, MD;  Location: [facility] ORS;  Service: General;  Laterality: Right;    SOCIAL HISTORY: Social History   Socioeconomic History   Marital status: Married    Spouse name: Not on file   Number of children: Not on file   Years of  education: Not on file   Highest education level: Not on file  Occupational History   Not on file  Tobacco Use   Smoking status: Every Day    Current packs/day: 0.25    Types: Cigarettes   Smokeless tobacco: Never  Vaping Use   Vaping status: Never Used  Substance and Sexual Activity   Alcohol use: Not Currently    Alcohol/week: 10.0 standard drinks of alcohol    Types: 10 Cans of beer per week   Drug use: Not Currently   Sexual activity: Yes    Partners: Female  Other Topics Concern   Not on file  Social History Narrative   Not on file   Social Drivers of Health   Financial Resource Strain: Medium Risk (05/07/2024)   Received from [facility]   Overall Financial Resource Strain (CARDIA)    Difficulty of Paying Living Expenses: Somewhat hard  Food Insecurity: No Food Insecurity (07/27/2024)   Hunger Vital Sign    Worried About Running Out of Food in the Last Year: Never true    Ran Out of Food in the Last Year: Never true  Recent Concern: Food Insecurity - Food Insecurity Present (05/07/2024)   Received from [facility]   Hunger Vital Sign    Within the past 12 months, you worried that your food would run out before you got the money to buy more.: Sometimes true    Ran Out of Food in the Last Year: Not on file  Transportation Needs: No Transportation Needs (07/27/2024)   PRAPARE - Administrator, Civil Service (Medical): No    Lack of Transportation (Non-Medical): No  Recent Concern: Transportation Needs - Unmet Transportation Needs (05/07/2024)   Received from [facility] - Transportation    In the past 12 months, has lack of transportation kept you from medical appointments or from getting medications?: No    Lack of Transportation (Non-Medical): Yes  Physical Activity: Not on file  Stress: Not on file  Social Connections: Unknown (07/25/2024)   Social Connection and Isolation Panel    Frequency of  Communication with Friends and Family: Patient declined    Frequency of Social Gatherings with Friends and Family: Patient declined    Attends Religious Services: Patient declined    Database administrator or Organizations: Patient declined    Attends Banker Meetings: Patient declined    Marital Status: Living with partner  Intimate Partner Violence: Not At Risk (07/27/2024)   Humiliation, Afraid, Rape, and Kick questionnaire    Fear of Current or Ex-Partner: No    Emotionally Abused: No    Physically Abused: No    Sexually Abused: No    FAMILY HISTORY: Family History  Family history unknown: Yes     PHYSICAL EXAMINATION: ECOG PERFORMANCE STATUS: 3 - Symptomatic, >50% confined to bed  Vitals:   08/09/24 0545 08/09/24 1321  BP:  130/85  Pulse:  (!) 107  Resp:  16  Temp: 98 F (36.7 C) 98.2 F (36.8 C)  SpO2:  96%   Filed Weights   08/03/24 1721  Weight: 144 lb 10 oz (65.6 kg)    GENERAL: alert, no distress and comfortable  SKIN: skin color, texture, turgor are normal, no rashes or significant lesions EYES: normal, conjunctiva are pink and non-injected, sclera clear OROPHARYNX: no exudate, no erythema and lips, buccal mucosa, and tongue normal  NECK: supple, thyroid normal size, non-tender, without nodularity LYMPH: no palpable lymphadenopathy in the cervical, axillary or inguinal LUNGS: clear to auscultation and percussion with normal breathing effort HEART: regular rate & rhythm and no murmurs and no lower extremity edema ABDOMEN: abdomen soft, non-tender and normal bowel sounds MUSCULOSKELETAL: +unable to ambulate/wheelchair  PSYCH: alert & oriented x 3 with fluent speech NEURO: no focal motor/sensory deficits   ALLERGIES:  has no known allergies.  MEDICATIONS:  Current Facility-Administered Medications  Medication Dose Route Frequency Provider Last Rate Last Admin   acetaminophen  (TYLENOL ) tablet 650 mg  650 mg Oral Q6H PRN Angiulli, Toribio PARAS,  PA-C       Or   acetaminophen  (TYLENOL ) suppository 650 mg  650 mg Rectal Q6H PRN Angiulli, Toribio PARAS, PA-C       baclofen  (LIORESAL ) tablet 5 mg  5 mg Oral TID PRN Emeline Search C, DO       butalbital -acetaminophen -caffeine  (FIORICET) 50-325-40 MG per tablet 2 tablet  2 tablet Oral Q4H PRN Angiulli, Toribio PARAS, PA-C       dexamethasone  (DECADRON ) tablet 2 mg  2 mg Oral Q8H Leak, Brandi L, NP   2 mg at 08/09/24 1253   methylphenidate  (RITALIN ) tablet 5 mg  5 mg Oral BID WC Emeline Search C, DO   5 mg at 08/09/24 1253   ondansetron  (ZOFRAN ) tablet 4 mg  4 mg Oral Q6H PRN Pegge Toribio PARAS, PA-C       Or   ondansetron  (ZOFRAN ) injection 4 mg  4 mg Intravenous Q6H PRN Angiulli, Toribio PARAS, PA-C       pantoprazole  (PROTONIX ) EC tablet 40 mg  40 mg Oral Daily Pegge Toribio PARAS, PA-C   40 mg at 08/08/24 2154   polyethylene glycol (MIRALAX  / GLYCOLAX ) packet 17 g  17 g Oral Daily PRN Angiulli, Toribio PARAS, PA-C       senna-docusate (Senokot-S) tablet 1 tablet  1 tablet Oral QHS Babs Arthea DASEN, MD   1 tablet at 08/07/24 2024   traZODone  (DESYREL ) tablet 50 mg  50 mg Oral QHS PRN Emeline Search C, DO   50 mg at 08/07/24 2024     LABORATORY DATA:  I have reviewed the data as listed Lab Results  Component Value Date   WBC 15.0 (H) 08/06/2024   HGB 15.0 08/06/2024   HCT 43.8 08/06/2024   MCV 91.1 08/06/2024   PLT 379 08/06/2024   Recent Labs    07/27/24 0343 08/01/24 1501 08/06/24 0555  NA 141 135 134*  K 4.5 4.1 4.4  CL 104 100 98  CO2 25 24 25   GLUCOSE 101* 134* 123*  BUN 10 21 16   CREATININE 0.91 1.00 0.90  CALCIUM 9.3 9.2 9.0  GFRNONAA >60 >60 >60  PROT 6.6 6.4* 5.9*  ALBUMIN 3.2* 3.1* 2.8*  AST 17 24 54*  ALT 15 16 80*  ALKPHOS 54 53 54  BILITOT 0.7 0.5 0.5    RADIOGRAPHIC STUDIES: I have personally reviewed the radiological images as listed and agreed with the findings in the report. DG Chest 2 View Result Date: 08/04/2024 EXAM: 2 VIEW(S) XRAY OF THE CHEST 08/04/2024  12:15:00 PM COMPARISON: None available. CLINICAL HISTORY: Pt order states leukocytosis. FINDINGS: LUNGS AND PLEURA: No focal pulmonary opacity. No pulmonary edema. No pleural  effusion. No pneumothorax. HEART AND MEDIASTINUM: No acute abnormality of the cardiac and mediastinal silhouettes. Hiatal hernia. BONES AND SOFT TISSUES: No acute osseous abnormality. IMPRESSION: 1. No acute process. 2. Hiatal hernia. Electronically signed by: Katheleen Faes MD 08/04/2024 04:02 PM EDT RP Workstation: [id number]   CT HEAD WO CONTRAST Result Date: 07/31/2024 CLINICAL DATA:  Initial postoperative evaluation. EXAM: CT HEAD WITHOUT CONTRAST TECHNIQUE: Contiguous axial images were obtained from the base of the skull through the vertex without intravenous contrast. RADIATION DOSE REDUCTION: This exam was performed according to the departmental dose-optimization program which includes automated exposure control, adjustment of the mA and/or kV according to patient size and/or use of iterative reconstruction technique. COMPARISON:  Prior MRI from 07/25/2024. FINDINGS: Brain: Postoperative changes from interval right frontal burr hole craniotomy for presumed biopsy of previously identified right cerebral mass. Small amount of postoperative pneumocephalus overlies the right cerebral convexity. Small volume hemorrhage seen along the biopsy tract at the level of the right frontal operculum. Hemorrhage within the biopsy cavity/mass itself measures 1.8 x 1.4 x 1.8 cm (series 2, image 20). The underlying mass itself is otherwise grossly stable. Associated regional mass effect with trace right-to-left shift at the septum pellucidum, not significantly changed. No visible intraventricular hemorrhage. No other acute intracranial hemorrhage. No acute large vessel territory infarct. No other mass lesion or mass effect. No hydrocephalus or extra-axial fluid collection. Vascular: No abnormal hyperdense vessel. Scattered calcified atherosclerosis  present at the skull base. Skull: Post craniotomy changes at the right frontal scalp. Skin staples in place. No adverse features. Sinuses/Orbits: Globes orbital soft tissues demonstrate no acute finding. Remote posttraumatic defect noted at the right lamina papyracea. Paranasal sinuses are largely clear. No mastoid effusion. Other: None. IMPRESSION: 1. Postoperative changes from interval right frontal burr hole craniotomy for biopsy of patient's known right cerebral mass. Small volume postoperative hemorrhage along the biopsy tract and within the resection cavity itself as detailed above. No other complicating features. 2. No other new acute intracranial abnormality. Electronically Signed   By: Morene Hoard M.D.   On: 07/31/2024 02:01   CT CHEST ABDOMEN PELVIS W CONTRAST Result Date: 07/25/2024 CLINICAL DATA:  Brain mass, assess for metastatic disease. EXAM: CT CHEST, ABDOMEN, AND PELVIS WITH CONTRAST TECHNIQUE: Multidetector CT imaging of the chest, abdomen and pelvis was performed following the standard protocol during bolus administration of intravenous contrast. RADIATION DOSE REDUCTION: This exam was performed according to the departmental dose-optimization program which includes automated exposure control, adjustment of the mA and/or kV according to patient size and/or use of iterative reconstruction technique. CONTRAST:  OMNIPAQUE  IOHEXOL  300 MG/ML  SOLN COMPARISON:  Abdominopelvic CT 01/29/2020 FINDINGS: CT CHEST FINDINGS Cardiovascular: Normal heart size. No pericardial effusion. Minimal aortic atherosclerosis without aneurysm. Mediastinum/Nodes: No mediastinal or hilar adenopathy. Small hiatal hernia. No esophageal wall thickening. No suspicious thyroid nodule. Lungs/Pleura: Minimal emphysema. No pulmonary nodule or mass. Mild dependent atelectasis. No pleural effusion or thickening. The trachea and central airways are clear. Musculoskeletal: No evidence of lytic or blastic osseous lesion.  Upper thoracic scoliosis. No chest wall soft tissue abnormalities. CT ABDOMEN PELVIS FINDINGS Hepatobiliary: No focal liver abnormality is seen. No gallstones, gallbladder wall thickening, or biliary dilatation. Pancreas: No evidence of pancreatic mass. No ductal dilatation or inflammation. Spleen: Normal in size without focal abnormality. Adrenals/Urinary Tract: No adrenal nodule. No renal mass or renal calculi. No hydronephrosis. No bladder wall thickening or evidence of bladder mass. Stomach/Bowel: Small hiatal hernia. There is no gastric wall thickening. No  evidence of small bowel mass, obstruction or inflammation. Moderate volume of stool in the colon. No obvious colonic mass. Distal descending and sigmoid colonic diverticulosis without diverticulitis. Vascular/Lymphatic: Normal caliber abdominal aorta with mild atherosclerosis. Portal vein is patent. No suspicious lymphadenopathy. Reproductive: Prostate is unremarkable. Other: Right inguinal hernia repair. No ascites. No omental thickening. No subcutaneous lesion. Musculoskeletal: Peripherally sclerotic lucency within the left iliac bone is unchanged from 2021 and considered benign. No suspicious bone lesion. Multilevel degenerative change in the spine. 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 IMPRESSION: 1. No evidence of primary malignancy or metastatic disease in the chest, abdomen, or pelvis. 2. Small hiatal hernia. Colonic diverticulosis without diverticulitis. 3. 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 Aortic Atherosclerosis (ICD10-I70.0) and Emphysema (ICD10-J43.9). Electronically Signed   By: Andrea Gasman M.D.   On: 07/25/2024 23:08   MR Cervical Spine W and Wo Contrast Result Date: 07/25/2024 CLINICAL DATA:  Provided history: Ataxia, nontraumatic, cervical pathology suspected. EXAM: MRI CERVICAL SPINE WITHOUT AND WITH CONTRAST TECHNIQUE: Multiplanar and multiecho pulse sequences of the cervical spine,  to include the craniocervical junction and cervicothoracic junction, were obtained without and with intravenous contrast. CONTRAST:  6mL GADAVIST  GADOBUTROL  1 MMOL/ML IV SOLN COMPARISON:  None. FINDINGS: Alignment: Levocurvature of the cervical spine. Nonspecific straightening of the expected cervical lordosis. 4 mm C3-C4 grade 1 anterolisthesis. Slight C4-C5 grade 1 anterolisthesis. 5 mm C7-T1 grade 1 anterolisthesis. Vertebrae: Multilevel degenerative endplate irregularity. Mild degenerative endplate edema at R5-R4, C5-C6 and C6-C7. Small hemangioma within the C3 vertebral body. Facet ankylosis on the left at C2-C3 and on the left at C7-T1. Multifocal marrow edema and enhancement within the posterior elements, likely degenerative and related to facet arthropathy Cord: No signal abnormality identified within the cervical spinal cord. No pathologic spinal cord enhancement. Multilevel spinal cord flattening as described below. Posterior Fossa, vertebral arteries, paraspinal tissues: Posterior fossa assessed on same-day brain MRI. Flow voids preserved within visible portions of the cervical vertebral arteries. No paraspinal mass or collection. Nonspecific ill-defined edema and enhancement within the paraspinal soft tissues on the right at the C4-C7 levels. Disc levels: Multilevel disc degeneration, greatest at C4-C5, C5-C6 and C6-C7 (moderate-to-advanced at these levels). Also of note, disc degeneration is moderate at C7-T1. Developmentally narrow cervical spinal canal due to short pedicles. C2-C3: Facet ankylosis on the left. No significant disc herniation or stenosis. C3-C4: Grade 1 anterolisthesis. Uncovertebral hypertrophy on the right. Advanced facet arthropathy on the right. Mild ligamentum flavum thickening. Mild spinal canal stenosis. Severe right neural foraminal narrowing. C4-C5: Grade 1 anterolisthesis. Posterior disc osteophyte complex with bilateral disc osteophyte ridge/uncinate hypertrophy. Moderate  facet arthropathy (greater on the left). Ligamentum flavum thickening. Severe spinal canal stenosis with spinal cord flattening. Severe bilateral neural foraminal narrowing. C5-C6: Posterior disc osteophyte complex with bilateral disc osteophyte ridge/uncinate hypertrophy. Facet arthropathy (greater on the right and moderate-to-advanced on the right). Mild ligamentum flavum thickening. Mild spinal canal stenosis. Moderate-to-severe bilateral neural foraminal narrowing (greater on the left). C6-C7: Posterior disc osteophyte complex with bilateral disc osteophyte ridge/uncinate hypertrophy. Mild facet arthropathy. No significant spinal canal stenosis. Bilateral neural foraminal narrowing (mild-to-moderate right, moderate-to-severe left). C7-T1: Grade 1 anterolisthesis. Shallow disc bulge. Moderate facet arthropathy on the right. Facet ankylosis on the left. Mild ligamentum flavum thickening. Mild spinal canal stenosis. The disc bulge slightly flattens the ventral aspect of the spinal cord. Severe bilateral neural foraminal narrowing. IMPRESSION: 1. Cervical spondylosis as outlined within the body of the report. 2. At C4-C5,  there is multifactorial severe spinal canal stenosis (with spinal cord flattening). Severe bilateral neural foraminal narrowing. 3. No more than mild spinal canal stenosis at the remaining levels. 4. Additional sites of foraminal stenosis, greatest on the right at C3-C4 (severe), bilaterally at C5-C6 (moderate-to-severe), on the left at C6-C7 (moderate-to-severe) and bilaterally at C7-T1 (severe). 5. Disc degeneration is greatest at C4-C5, C5-C6 and C6-C7 (moderate-to-advanced in severity with mild degenerative endplate edema at these levels). 6. Grade 1 anterolisthesis at C3-C4, C4-C5 and C7-T1. 7. Levocurvature of the cervical spine. 8. Multifocal marrow edema and enhancement within the posterior elements, likely degenerative and related to facet arthropathy. 9. Facet ankylosis on the left at  C2-C3 and on the left at C7-T1. 10. Nonspecific edema and enhancement within the right paraspinal soft tissues at the C4-C7 levels. Electronically Signed   By: Rockey Childs D.O.   On: 07/25/2024 19:32   MR THORACIC SPINE W WO CONTRAST Result Date: 07/25/2024 EXAM: MRI THORACIC SPINE WITH AND WITHOUT INTRAVENOUS CONTRAST 07/25/2024 05:42:39 PM TECHNIQUE: Multiplanar multisequence MRI of the thoracic spine was performed with and without the administration of intravenous contrast. COMPARISON: None available. CLINICAL HISTORY: Bone lesion, thoracic spine, incidental. Patient reports increasing weakness in his legs, multiple falls, and stiffness in his legs. History of polio with prior multiple surgeries. Prescribed gabapentin  with no significant help. FINDINGS: BONES AND ALIGNMENT: Mild thoracic levoscoliosis. Normal vertebral body heights. Bone marrow signal is unremarkable. No abnormal enhancement. SPINAL CORD: Normal spinal cord volume. Normal spinal cord signal. SOFT TISSUES: Unremarkable. DEGENERATIVE CHANGES: Minimal degenerative disc disease. No spinal canal stenosis or neural foraminal narrowing. IMPRESSION: 1. No spinal canal stenosis or neural foraminal narrowing in the thoracic spine. 2. Mild thoracic levoscoliosis. Electronically signed by: Franky Stanford MD 07/25/2024 07:13 PM EDT RP Workstation: [id number]   MR Brain W and Wo Contrast Result Date: 07/25/2024 EXAM: MRI BRAIN WITH AND WITHOUT CONTRAST 07/25/2024 05:42:39 PM TECHNIQUE: Multiplanar multisequence MRI of the head/brain was performed with and without the administration of intravenous contrast. COMPARISON: CT head 06/06/2024 CLINICAL HISTORY: Headache, increasing frequency or severity. Patient reports increasing weakness in his legs and multiple falls. History of polio with prior multiple surgeries. FINDINGS: BRAIN AND VENTRICLES: There is a peripheral enhancing mass centered in the right frontoparietal white matter involving the centrum  semiovale and extending into the periventricular white matter and corona radiata. The mass measures 4.5 x 4.5 x 2.9 cm. The mass abuts the ependymal surface of the right lateral ventricle with associated mass effect on the ventricle. Mild diffusion restriction. There is associated susceptibility along the periphery of the mass. Surrounding T2/FLAIR hyperintensity suggestive of peritumoral edema which involves the posterior aspect of the right centrum semiovale and the right periventricular white matter. Additional T2/FLAIR hyperintensity in the periventricular and subcortical white matter suggestive of chronic microvascular ischemic changes. There is mild cerebral volume loss. No significant midline shift. No evidence of acute infarct. ORBITS: No acute abnormality. SINUSES: Mild mucosal thickening in the left maxillary sinus. Chronic deformity of the right lamina papyracea. BONES AND SOFT TISSUES: Normal bone marrow signal and enhancement. No acute soft tissue abnormality. IMPRESSION: 1. Peripheral enhancing mass in the right frontoparietal lobes measuring 4.5 x 4.5 x 2.9 cm, with mild associated peritumoral edema. The mass abuts the ependymal surface of the right lateral ventricle, causing mass effect without significant midline shift. 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 2. Additional T2/FLAIR hyperintensity in the periventricular and subcortical white matter, suggestive of chronic microvascular ischemic  changes. 3. No acute infarct. Electronically signed by: Donnice Mania MD 07/25/2024 06:59 PM EDT RP Workstation: [id number]   CT HEAD WO CONTRAST ( ) Result Date: 07/25/2024 CLINICAL DATA:  Provided history: Headache, new onset. EXAM: CT HEAD WITHOUT CONTRAST TECHNIQUE: Contiguous axial images were obtained from the base of the skull through the vertex without intravenous contrast. RADIATION DOSE REDUCTION: This exam was performed according to the departmental  dose-optimization program which includes automated exposure control, adjustment of the mA and/or kV according to patient size and/or use of iterative reconstruction technique. COMPARISON:  Head CT 05/07/2024. FINDINGS: Brain: Since the head CT of 05/07/2024, interval increase in size of a lesion centered within the right frontoparietal white matter, now measuring 4.9 x 3.7 cm (for instance as seen on series 5, image 23) (series 2, image 20). The lesion also appears to extend to involve the callosal body (for instance as seen on series 4, image 36). This is most suspicious for a mass. Centrally, the lesion is hypodense. There is ill-defined hyperdensity along the which could reflect hypercellularity, mineralization and/or associated hemorrhage. Progressive mass effect with partial effacement of the right lateral ventricle. No midline shift. No demarcated cortical infarct. No extra-axial fluid collection. Vascular: No hyperdense vessel.  Atherosclerotic calcifications. Skull: No calvarial fracture or aggressive osseous lesion. Visible sinuses/orbits: No mass or acute finding within the imaged orbits. Chronic medially displaced fracture deformity of the right lamina papyracea. Mild mucosal thickening within the right frontal sinus inferiorly. IMPRESSION: 4.9 x 3.7 cm lesion centered within the right frontoparietal white matter, as described and increased in size since the head CT of 05/07/2024. This is most suspicious for a mass (such as a high-grade primary CNS neoplasm, metastatic lesion or lymphoma). However, a brain MRI (with and without contrast) is recommended for further characterization. Progressive mass effect with partial effacement of the right lateral ventricle. No midline shift. Ill-defined hyperdensity at the periphery of the lesion which could reflect hypercellularity, mineralization and/or hemorrhage. Electronically Signed   By: Rockey Childs D.O.   On: 07/25/2024 16:20     The total time spent in  the appointment was 55 minutes encounter with patients including review of chart and various tests results, discussions about plan of care and coordination of care plan   All questions were answered. The patient knows to call the clinic with any problems, questions or concerns. No barriers to learning was detected.  Olam JINNY Brunner, NP 9/18/20251:58 PM

## 2024-08-09 NOTE — Progress Notes (Cosign Needed Addendum)
 Speech Language Pathology Daily Session Note  Patient Details  Name: Joshua Harmon MRN: [id number] Date of Birth: 19-Sep-1961  Today's Date: 08/09/2024 SLP Individual Time: 1015 - 1100  45 min  Short Term Goals: Week 1: SLP Short Term Goal 1 (Week 1): Pt will utilize compensatory memory strategies as needed to recall recent/relevant info w/ minA SLP Short Term Goal 2 (Week 1): Pt will sustain attention to task in a mildly distracting environment for ~10 mins given minA SLP Short Term Goal 3 (Week 1): Pt will solve functional problems w/ minA SLP Short Term Goal 4 (Week 1): Pt will improve L visual attention to minA SLP Short Term Goal 5 (Week 1): Pt will recall 2 changes in cognition given modA  Skilled Therapeutic Interventions:   SLP conducted skilled therapy session targeting cognition goals. SLP facilitated calendar task to target orientation and problem solving. Patient recalled month and year but required mod cues to recall day of the month. Patient min up to max cuing for problem solving and benefitted from increased processing time to identify correct answer. SLP conducted functional monetary task with real bills/coins to target working memory, sustained attention, and problem solving. Patient min to modA to generate bill/coin amount, however he required total assist to recall exact amount of money verbally provided. Patient impulsive during presentation of amounts, often interrupting SLP before SLP finished stating full amount. SLP prompted patient to write money amounts to increase retention. Patient required increased processing time to generate appropriate amount of bills/coins. Patient was left in room with call bell in reach and alarm set. SLP will continue to target goals per plan of care.     Pain   none  Therapy/Group: Individual Therapy  Yaroslav Gombos Kwakye-Ndlovu 08/09/2024, 3:13 PM

## 2024-08-10 ENCOUNTER — Encounter: Admitting: Neurosurgery

## 2024-08-10 DIAGNOSIS — D496 Neoplasm of unspecified behavior of brain: Secondary | ICD-10-CM | POA: Diagnosis not present

## 2024-08-10 LAB — SURGICAL PATHOLOGY

## 2024-08-10 NOTE — Progress Notes (Signed)
 Physical Therapy Session Note  Patient Details  Name: Joshua Harmon MRN: [id number] Date of Birth: 01-18-1961  Today's Date: 08/10/2024 PT Individual Time: 9149-9053 PT Individual Time Calculation (min): 56 min   Short Term Goals: Week 1:  PT Short Term Goal 1 (Week 1): Pt will complete bed mobility with MinA PT Short Term Goal 2 (Week 1): Pt will complete SPT using MinA + LRAD PT Short Term Goal 3 (Week 1): Pt will complete 20 ft amb using modA + LRAD PT Short Term Goal 4 (Week 1): Pt will initiate stair training  Skilled Therapeutic Interventions/Progress Updates:    Pt presents in room in bed, agreeable to PT. Pt denies pain. Pt requesting to get dressed, session focused on therapeutic activities for transfer training, participation with self care tasks, BUE coordination for self care tasks, standing tolerance and standing balance with self care tasks and gait training. Pt completes bed mobility with min assist due to L inattention for safety with positioning, cues for sequencing and safety. Pt completes sit to stand in stedy with min assist for L hand placement on pull bar, supervision to come to standing and verbal cues for upright posture. Pt transported to bathroom via stedy to come to sitting on toilet. Pt unable to have bowel movement however as pt completing periarea hygiene, noted smear, charted. Pt maintains standing 3x1 min with cues for upright posture to correct for L lateral lean. Pt requires frequent cues for attention to LUE positioning on stedy for balance. Pt then trnasferred to WC via stedy. Pt requires mod assist for donning pants and shirt in sitting, stands to pull pants over hips with min assist. Pt completes hand hygiene and oral hygiene at sink with mod/max cues for LUE involvement and coordination for holding toothbrush while RUE places toothpaste, completed to promote L attention and LUE coordination. Pt transported to day room and completes sit to stand with min assist  x2, completes gait training 180' with BUE HHA min assist x2 however does progress to mod assist x2 with fatigue with max cues for LLE advance phase. Pt demonstrating LLE knee flexion throughout stance and swing phase of gait. Pt returns to room and remains seated in [facility] with all needs within reach, cal light in place and chair alarm donned and activated at end of session.   Therapy Documentation Precautions:  Precautions Precautions: Fall Recall of Precautions/Restrictions: Impaired Precaution/Restrictions Comments: Poor awareness, L lean, ataxic Restrictions Weight Bearing Restrictions Per Provider Order: No    Therapy/Group: Individual Therapy  Reche Ohara PT, DPT 08/10/2024, 9:51 AM

## 2024-08-10 NOTE — Progress Notes (Signed)
 Speech Language Pathology Weekly Progress and Session Note  Patient Details  Name: Joshua Harmon MRN: [id number] Date of Birth: [birth year]  Beginning of progress report period: August 03, 2024 End of progress report period: August 10, 2024  Today's Date: 08/10/2024 SLP Individual Time: 1100-1200 SLP Individual Time Calculation (min): 60 min  Short Term Goals: Week 1: SLP Short Term Goal 1 (Week 1): Pt will utilize compensatory memory strategies as needed to recall recent/relevant info w/ minA SLP Short Term Goal 1 - Progress (Week 1): Not met SLP Short Term Goal 2 (Week 1): Pt will sustain attention to task in a mildly distracting environment for ~10 mins given minA SLP Short Term Goal 2 - Progress (Week 1): Not met SLP Short Term Goal 3 (Week 1): Pt will solve functional problems w/ minA SLP Short Term Goal 3 - Progress (Week 1): Not met SLP Short Term Goal 4 (Week 1): Pt will improve L visual attention to minA SLP Short Term Goal 4 - Progress (Week 1): Not met SLP Short Term Goal 5 (Week 1): Pt will recall 2 changes in cognition given modA SLP Short Term Goal 5 - Progress (Week 1): Discontinued (comment) (d/c d/t severity of awareness deficits)    New Short Term Goals: Week 2: SLP Short Term Goal 1 (Week 2): Pt will utilize compensatory memory strategies as needed to recall recent/relevant info w/ modA SLP Short Term Goal 2 (Week 2): Pt will sustain attention to task in a mildly distracting environment for ~10 mins given modA SLP Short Term Goal 3 (Week 2): Pt will solve functional problems w/ modA SLP Short Term Goal 4 (Week 2): Pt will improve L visual attention to modA  Weekly Progress Updates: Limited progress noted this week d/t lack of carryover, severity of deficits, and awareness deficits. As tx tasks continue, he appears to present w/ worse cognition as compared to eval. Anticipate this is d/t introduction of functional/written tasks vs verbal tasks at time of eval.  Pt/family education ongoing. He demonstrates mild L buccal pocketing, but does clear independently and continues to tolerate a regular diet/thin liquids. Pt/family education ongoing. He would benefit from continued ST services to target remaining cognitive deficits, maximize pt independence, and reduce caregiver burden.    Intensity: Minumum of 1-2 x/day, 30 to 90 minutes Frequency: 3 to 5 out of 7 days Duration/Length of Stay: 10/8 Treatment/Interventions: Cognitive remediation/compensation;Cueing hierarchy;Functional tasks;Patient/family education;Speech/Language facilitation;Therapeutic Exercise;Environmental controls;Therapeutic Activities   Daily Session  Skilled Therapeutic Interventions:     Pt greeted at bedside. He was up in his [facility] and agreeable to tx tasks targeting cognition. He was assisted to the speech office via WC. During orientation review, he was within 3 days of the current date. He benefited from modA cues to utilize the calendar and ID correct date. He was then challenged to pattern copy task. He benefited from maxA cues for sustained attention, L visual attention, problem solving, and awareness. He was able to recall visit from Oncology yesterday and personal details re this SLP, however, overall required modA for recall of events the last ~48 hours. At the end of tx tasks, he was assisted back to his room and left w/ the alarm set and call light within reach. Recommend cont ST per POC.   Pain  No pain reported  Therapy/Group: Individual Therapy  Joshua Harmon 08/10/2024, 12:38 PM

## 2024-08-10 NOTE — Progress Notes (Signed)
 Occupational Therapy Weekly Progress Note  Patient Details  Name: Joshua Harmon MRN: [id number] Date of Birth: 1961-04-30  Beginning of progress report period: August 03, 2024 End of progress report period: August 10, 2024  Today's Date: 08/10/2024 OT Individual Time: 8696-8584 OT Individual Time Calculation (min): 72 min    Patient has met 4 of 5 short term goals. Joshua Harmon has made great progress in his first week, progressing from max A +2 to mod A for stand pivots and ADLs overall. His LUE is able to be functionally integrated into b/d with mod facilitation. 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   Patient continues to demonstrate the following deficits: muscle weakness, decreased cardiorespiratoy endurance, impaired timing and sequencing, abnormal tone, unbalanced muscle activation, motor apraxia, ataxia, decreased coordination, and decreased motor planning, decreased midline orientation and decreased attention to left, decreased awareness, decreased problem solving, decreased safety awareness, decreased memory, and delayed processing, and decreased sitting balance, decreased standing balance, decreased postural control, hemiplegia, and decreased balance strategies and therefore will continue to benefit from skilled OT intervention to enhance overall performance with BADL and iADL.  Patient progressing toward long term goals..  Continue plan of care.  OT Short Term Goals Week 1:  OT Short Term Goal 1 (Week 1): Pt will don shirt with mod A OT Short Term Goal 1 - Progress (Week 1): Met OT Short Term Goal 2 (Week 1): Pt will complete a stand pivot transfer to the w/c with mod A OT Short Term Goal 2 - Progress (Week 1): Met OT Short Term Goal 3 (Week 1): Pt will improve LUE ataxia by using the LUE during bathing with min A OT Short Term Goal 3 - Progress (Week 1): Met OT Short Term Goal 4 (Week 1): Pt  will don pants with mod A OT Short Term Goal 4 - Progress (Week 1): Met OT Short Term Goal 5 (Week 1): Pt will right trunk and head in sitting with no more than mod facilitation OT Short Term Goal 5 - Progress (Week 1): Progressing toward goal Week 2:  OT Short Term Goal 1 (Week 2): Pt will complete LB dressing with min A OT Short Term Goal 2 (Week 2): Pt will complete stand pivot with min A OT Short Term Goal 3 (Week 2): Pt will complete sit > stand with min A OT Short Term Goal 4 (Week 2): Pt will right head/trunk to midline with no more than mod cueing  Skilled Therapeutic Interventions/Progress Updates:    Pt received in the w/c with no c/o pain and agreeable to OT session. Pt was taken via w/c to the therapy gym for time management. He requested to start with the Nustep and reported I love this thing and how strong it makes me. Mod A for ataxic trunk during stand pivot transfer to NuStep. He completed 10 min of reciprocal stepping with focus on LUE/LE attention with very poor awareness of the hemi side overall, frequent cueing provided. Stand pivot to the w/c with mod A. Pt then transferred onto the Litegait to use functional mobility training to address trunk coordination/stability, LUE/LLE ataxia and generalized conditioning/activity tolerance. First trial he completed 75 ft on .3 mph, 2nd trial he completed 155 ft on .2 mph. He required frequent cueing for midline orientation of trunk and head and LLE stride length. Frequent mod A for placement of the LLE. He required max  A of 2 to come off of litegait d/t severe ataxia. He ended with LUE coordination and NMR training- reaching for cones placed in the far L side of his vision to promote L attention as well. Mod cueing and min facilitation provided. Pt was taken via w/c to the therapy gym for time management. Pt was left sitting up in the wheelchair with all needs met, chair alarm set, and call bell within reach.   Therapy  Documentation Precautions:  Precautions Precautions: Fall Recall of Precautions/Restrictions: Impaired Precaution/Restrictions Comments: Poor awareness, L lean, ataxic Restrictions Weight Bearing Restrictions Per Provider Order: No  Therapy/Group: Individual Therapy  Joshua Harmon 08/10/2024, 2:32 PM

## 2024-08-10 NOTE — Progress Notes (Signed)
 PROGRESS NOTE   Subjective/Complaints:  No complaints, no events overnight, vitals stable.  Fianc on the phone during exam, states that he is thinking very clearly over the last few days and has not noticed significant difficulties.  Patient complains of being bored between therapies, does not want mentally stimulating activities such as puzzles, games, or coloring activities.  Discussed updates from neuro-oncology team yesterday, plans for patient to be discussed at meeting this coming week and possible early discharge.  Patient and his fiance understanding, fianc worried that she will not be prepared for him to come home as early as next week.  Will work on coordinating that with team.  ROS: Patient denies fever, rash, sore throat, blurred vision, dizziness, nausea, vomiting, diarrhea, cough, shortness of breath or chest pain, joint or back/neck pain,   or mood change.   Objective:   No results found.  No results for input(s): WBC, HGB, HCT, PLT in the last 72 hours.  No results for input(s): NA, K, CL, CO2, GLUCOSE, BUN, CREATININE, CALCIUM in the last 72 hours.    Intake/Output Summary (Last 24 hours) at 08/10/2024 0936 Last data filed at 08/10/2024 0852 Gross per 24 hour  Intake 720 ml  Output 1800 ml  Net -1080 ml        Physical Exam: Vital Signs Blood pressure 113/85, pulse 68, temperature 98 F (36.7 C), temperature source Oral, resp. rate 17, height 6' 1 (1.854 m), weight 65.6 kg, SpO2 96%. Constitutional: No distress . Vital signs reviewed.  Sitting up in bedside chair. HEENT: right crani site CDI with staples.  EOMI, oral membranes moist Neck: supple Cardiovascular: RRR without murmur. No JVD    Respiratory/Chest: CTA Bilaterally without wheezes or rales. Normal effort    GI/Abdomen: BS +, non-tender, non-distended Ext: no clubbing, cyanosis, or edema Psych: pleasant and  cooperative  Skin: C/D/I. No apparent lesions.  Staples intact along.  Well-approximated.  Neurologic exam:    Pt alert and oriented x 3. Right gaze preference./Left hemineglect--ongoing Some mild difficulty with attention and insight   MMT: RUE and RLE grossly 5/5 prox to distal.  LUE 4 to 4+ delt, biceps, triceps, wrist and HI. LLE 4/5 HF, KE and 2-3/5 ADF/PF.  --Unchanged Sensation appears grossly intact to LT and pain in all 4's.  DTR's 1+.  No apparent ataxia No apparent resting tone. MSK: Much improved range of motion of neck with bilateral rotation, sidebending.  Physical exam unchanged from the above on reexamination 08/10/24    Assessment/Plan: 1. Functional deficits which require 3+ hours per day of interdisciplinary therapy in a comprehensive inpatient rehab setting. Physiatrist is providing close team supervision and 24 hour management of active medical problems listed below. Physiatrist and rehab team continue to assess barriers to discharge/monitor patient progress toward functional and medical goals  Care Tool:  Bathing    Body parts bathed by patient: Left arm, Chest, Abdomen, Front perineal area, Buttocks, Right upper leg, Left upper leg, Face   Body parts bathed by helper: Right lower leg, Left lower leg Body parts n/a: Right arm   Bathing assist Assist Level: Maximal Assistance - Patient 24 - 49%  Upper Body Dressing/Undressing Upper body dressing   What is the patient wearing?: Pull over shirt    Upper body assist Assist Level: Maximal Assistance - Patient 25 - 49%    Lower Body Dressing/Undressing Lower body dressing      What is the patient wearing?: Underwear/pull up, Pants     Lower body assist Assist for lower body dressing: Total Assistance - Patient < 25%     Toileting Toileting    Toileting assist Assist for toileting: Maximal Assistance - Patient 25 - 49%     Transfers Chair/bed transfer  Transfers assist     Chair/bed  transfer assist level: Maximal Assistance - Patient 25 - 49%     Locomotion Ambulation   Ambulation assist   Ambulation activity did not occur: Safety/medical concerns (2/2 strong posture deficit and fatigue)          Walk 10 feet activity   Assist  Walk 10 feet activity did not occur: Safety/medical concerns (2/2 strong posture deficit and fatigue)        Walk 50 feet activity   Assist Walk 50 feet with 2 turns activity did not occur: Safety/medical concerns (2/2 strong posture deficit and fatigue)         Walk 150 feet activity   Assist Walk 150 feet activity did not occur: Safety/medical concerns (2/2 strong posture deficit and fatigue)         Walk 10 feet on uneven surface  activity   Assist Walk 10 feet on uneven surfaces activity did not occur: Safety/medical concerns (2/2 strong posture deficit and fatigue)         Wheelchair     Assist Is the patient using a wheelchair?: Yes Type of Wheelchair: Manual    Wheelchair assist level: Dependent - Patient 0% Max wheelchair distance: 315ft    Wheelchair 50 feet with 2 turns activity    Assist        Assist Level: Dependent - Patient 0%   Wheelchair 150 feet activity     Assist      Assist Level: Dependent - Patient 0%   Blood pressure 113/85, pulse 68, temperature 98 F (36.7 C), temperature source Oral, resp. rate 17, height 6' 1 (1.854 m), weight 65.6 kg, SpO2 96%.  Medical Problem List and Plan: 1. 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 Janjua 9/8             -patient may  shower             -ELOS/Goals: 12-14 days, supervision to min assist with self-care and mobility, mod I to supervision with cognition  -Continue CIR therapies including PT, OT, and SLP  - 10/8 DC   - 9/16: On/off confusion for night shift; telesitter started yesterday. Poor proprioception in LUE and LLE; difficulty with L sided attention. Max A  LBD, Mod-Max A transfers, making very good progress. Mod A for bed mobility and transfers, walked 50 ft with Min A x2. L ataxia. Perseverative. Mod-severe issues with memory and problem solving. L oral pocketing.    - Will need note for driving restrictions  -0-81: Dr. Eward NP will follow-up with patient , they will be meeting with him at 1 PM this coming Tuesday.  Unsure of plan at this time.  May need to facilitate early discharge if chemo/radiation therapy required urgently--discussed with family 9/19   2.  Antithrombotics: -DVT/anticoagulation:  Mechanical:  Antiembolism stockings, knee (TED hose) Bilateral  lower extremities Sequential compression devices, below knee Bilateral lower extremities             -antiplatelet therapy: N/A 3. Pain Management: Tylenol , Fioricet as needed  - no pain endorsed 4. Mood/Behavior/Sleep: LCSW to follow for evaluation and support when available.              -antipsychotic agents: N/A   - 9/16: Started on telesitter overnight for impulsivity; will start sleep log + trazodone  50 mg at bedtime PRN--sleep log appropriate  5. Neuropsych/cognition: This patient is not capable of making decisions on his own behalf.   - 9/16: start ritalin  5 mg BID for attention and lethargy--continue trial, arousal much improved but concentration remains difficult  9-19: Per patient's fianc, seen considerable improvements.  Ongoing severe deficits per therapy note; concurrent continue current regimen  6. Skin/Wound Care: Routine pressure-relief measures             -Per neurosurgery staple removal planned for this week (POD #10 9/18)--confirmed with Dr. Rosslyn, order placed to remove staples 9-19  7. Fluids/Electrolytes/Nutrition: Monitor intake and output and routine follow-up labs in a.m.              - on regular/ thins diet             - GERD: continue PPI  8.  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 Janjua on 9/12 decrease to 2 mg q8h indefinitely until radiation treatment begins - 9-19: IDH testing unable to be performed due to insufficient sample; per Dr. Buckley, will not alter or delay treatment, will send out  9.  Impaired glucose tolerance: Secondary to steroids CBGs have been below 180 has not required coverage.              - CBG monitoring was every 4 hours now 2x daily --has been stable, continue to hold SSI   - 9/15: CBG remain between 100 and 150; can stop CBGs    10.  Constipation: pt had bm yesterday -senokot-s 1 tab at bedtime -miralax  prn             Last bowel movement 9-17, small x2  11.  Leukocytosis.  likely secondary to Decadron . check urine and chest x-ray today to screen for secondary causes.  Low threshold for blood cultures and broad-spectrum antibiotics if he develops vital instability.  - 9-14: Urinalysis clear, chest x-ray with no acute process.  Vitally stable, afebrile.  Trend  9/15 sl increase to 15k today. Pt afebrile, no s/s infection. UA/CXR as above  12.  Right neck tone/L hemineglect.  Start baclofen  5 mg 3 times daily.  Not currently bothersome, but want to prevent creasing and skin breakdown if possible.  - 9-14: Head positioning much better today, likely more related to hemineglect.  Move baclofen  to 3 times daily as needed.  9/15 pt fairly neutral today--left inattention is biggest factor  -18: Range of motion, inattention improving.  LOS: 7 days A FACE TO FACE EVALUATION WAS PERFORMED  Joesph JAYSON Likes 08/10/2024, 9:36 AM

## 2024-08-11 DIAGNOSIS — K5900 Constipation, unspecified: Secondary | ICD-10-CM

## 2024-08-11 DIAGNOSIS — D72829 Elevated white blood cell count, unspecified: Secondary | ICD-10-CM

## 2024-08-11 DIAGNOSIS — D496 Neoplasm of unspecified behavior of brain: Secondary | ICD-10-CM | POA: Diagnosis not present

## 2024-08-11 NOTE — Progress Notes (Signed)
 PROGRESS NOTE   Subjective/Complaints: No new complaints or concerns.  Family in the room today visiting.   ROS: Patient denies chills, rash, sore throat, blurred vision, dizziness, nausea, vomiting, diarrhea, cough, shortness of breath or chest pain, joint or back/neck pain,   or mood change.   Objective:   No results found.  No results for input(s): WBC, HGB, HCT, PLT in the last 72 hours.  No results for input(s): NA, K, CL, CO2, GLUCOSE, BUN, CREATININE, CALCIUM in the last 72 hours.    Intake/Output Summary (Last 24 hours) at 08/11/2024 1554 Last data filed at 08/11/2024 1324 Gross per 24 hour  Intake 240 ml  Output 750 ml  Net -510 ml        Physical Exam: Vital Signs Blood pressure 128/88, pulse 100, temperature 98 F (36.7 C), resp. rate 20, height 6' 1 (1.854 m), weight 65.6 kg, SpO2 98%. Constitutional: No distress . Vital signs reviewed.  Sitting up in bedside chair.  Family in the room.  Patient appears comfortable HEENT: right crani site CDI with staples.  EOMI, oral membranes moist Neck: supple Cardiovascular: RRR without murmur. No JVD    Respiratory/Chest: Clear to auscultation bilaterally, nonlabored breathing GI/Abdomen: BS +, non-tender, non-distended, soft Ext: no clubbing, cyanosis, or edema Psych: pleasant and cooperative  Skin: C/D/I. No apparent lesions. .  Well-approximated.  Neurologic exam:    Pt alert and oriented x 3. Right gaze preference./Left hemineglect--ongoing Some mild difficulty with attention and insight   MMT: RUE and RLE grossly 5/5 prox to distal.  LUE 4 to 4+ delt, biceps, triceps, wrist and HI. LLE 4/5 HF, KE and 2-3/5 ADF/PF.  --Unchanged Sensation appears grossly intact to LT and pain in all 4's.  DTR's 1+.  No apparent ataxia No apparent resting tone. MSK: Much improved range of motion of neck with bilateral rotation,  sidebending.  Physical exam unchanged from the above on reexamination 08/11/24    Assessment/Plan: 1. Functional deficits which require 3+ hours per day of interdisciplinary therapy in a comprehensive inpatient rehab setting. Physiatrist is providing close team supervision and 24 hour management of active medical problems listed below. Physiatrist and rehab team continue to assess barriers to discharge/monitor patient progress toward functional and medical goals  Care Tool:  Bathing    Body parts bathed by patient: Left arm, Chest, Abdomen, Front perineal area, Buttocks, Right upper leg, Left upper leg, Face   Body parts bathed by helper: Right lower leg, Left lower leg Body parts n/a: Right arm   Bathing assist Assist Level: Maximal Assistance - Patient 24 - 49%     Upper Body Dressing/Undressing Upper body dressing   What is the patient wearing?: Pull over shirt    Upper body assist Assist Level: Maximal Assistance - Patient 25 - 49%    Lower Body Dressing/Undressing Lower body dressing      What is the patient wearing?: Underwear/pull up, Pants     Lower body assist Assist for lower body dressing: Total Assistance - Patient < 25%     Toileting Toileting    Toileting assist Assist for toileting: Moderate Assistance - Patient 50 - 74%  Transfers Chair/bed transfer  Transfers assist     Chair/bed transfer assist level: Moderate Assistance - Patient 50 - 74%     Locomotion Ambulation   Ambulation assist   Ambulation activity did not occur: Safety/medical concerns (2/2 strong posture deficit and fatigue)          Walk 10 feet activity   Assist  Walk 10 feet activity did not occur: Safety/medical concerns (2/2 strong posture deficit and fatigue)        Walk 50 feet activity   Assist Walk 50 feet with 2 turns activity did not occur: Safety/medical concerns (2/2 strong posture deficit and fatigue)         Walk 150 feet  activity   Assist Walk 150 feet activity did not occur: Safety/medical concerns (2/2 strong posture deficit and fatigue)         Walk 10 feet on uneven surface  activity   Assist Walk 10 feet on uneven surfaces activity did not occur: Safety/medical concerns (2/2 strong posture deficit and fatigue)         Wheelchair     Assist Is the patient using a wheelchair?: Yes Type of Wheelchair: Manual    Wheelchair assist level: Dependent - Patient 0% Max wheelchair distance: 370ft    Wheelchair 50 feet with 2 turns activity    Assist        Assist Level: Dependent - Patient 0%   Wheelchair 150 feet activity     Assist      Assist Level: Dependent - Patient 0%   Blood pressure 128/88, pulse 100, temperature 98 F (36.7 C), resp. rate 20, height 6' 1 (1.854 m), weight 65.6 kg, SpO2 98%.  Medical Problem List and Plan: 1. 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 Janjua 9/8             -patient may  shower             -ELOS/Goals: 12-14 days, supervision to min assist with self-care and mobility, mod I to supervision with cognition  -Continue CIR therapies including PT, OT, and SLP  - 10/8 DC   - 9/16: On/off confusion for night shift; telesitter started yesterday. Poor proprioception in LUE and LLE; difficulty with L sided attention. Max A LBD, Mod-Max A transfers, making very good progress. Mod A for bed mobility and transfers, walked 50 ft with Min A x2. L ataxia. Perseverative. Mod-severe issues with memory and problem solving. L oral pocketing.    - Will need note for driving restrictions  -0-81: Dr. Eward NP will follow-up with patient , they will be meeting with him at 1 PM this coming Tuesday.  Unsure of plan at this time.  May need to facilitate early discharge if chemo/radiation therapy required urgently--discussed with family 9/19   2.  Antithrombotics: -DVT/anticoagulation:  Mechanical:   Antiembolism stockings, knee (TED hose) Bilateral lower extremities Sequential compression devices, below knee Bilateral lower extremities             -antiplatelet therapy: N/A 3. Pain Management: Tylenol , Fioricet as needed  - no pain endorsed 4. Mood/Behavior/Sleep: LCSW to follow for evaluation and support when available.              -antipsychotic agents: N/A   - 9/16: Started on telesitter overnight for impulsivity; will start sleep log + trazodone  50 mg at bedtime PRN--sleep log appropriate  5. Neuropsych/cognition: This patient is not capable of making decisions on  his own behalf.   - 9/16: start ritalin  5 mg BID for attention and lethargy--continue trial, arousal much improved but concentration remains difficult  9-19: Per patient's fianc, seen considerable improvements.  Ongoing severe deficits per therapy note; concurrent continue current regimen  6. Skin/Wound Care: Routine pressure-relief measures             -Per neurosurgery staple removal planned for this week (POD #10 9/18)--confirmed with Dr. Rosslyn, order placed to remove staples 9-19  7. Fluids/Electrolytes/Nutrition: Monitor intake and output and routine follow-up labs in a.m.              - on regular/ thins diet             - GERD: continue PPI  8.  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 Janjua on 9/12 decrease to 2 mg q8h indefinitely until radiation treatment begins - 9-19: IDH testing unable to be performed due to insufficient sample; per Dr. Buckley, will not alter or delay treatment, will send out  9.  Impaired glucose tolerance: Secondary to steroids CBGs have been below 180 has not required coverage.              - CBG monitoring was every 4 hours now 2x daily --has been stable, continue to hold SSI   - 9/15: CBG remain between 100 and 150; can stop CBGs    10.  Constipation: pt had bm  yesterday -senokot-s 1 tab at bedtime -miralax  prn             Last bowel movement 9-17, small x2  Lbm today 9/20 and he also had 1 yesterday  11.  Leukocytosis.  likely secondary to Decadron . check urine and chest x-ray today to screen for secondary causes.  Low threshold for blood cultures and broad-spectrum antibiotics if he develops vital instability.  - 9-14: Urinalysis clear, chest x-ray with no acute process.  Vitally stable, afebrile.  Trend  9/15 sl increase to 15k today. Pt afebrile, no s/s infection. UA/CXR as above  No signs of infection noted continue to monitor  12.  Right neck tone/L hemineglect.  Start baclofen  5 mg 3 times daily.  Not currently bothersome, but want to prevent creasing and skin breakdown if possible.  - 9-14: Head positioning much better today, likely more related to hemineglect.  Move baclofen  to 3 times daily as needed.  9/15 pt fairly neutral today--left inattention is biggest factor  -18: Range of motion, inattention improving.  LOS: 8 days A FACE TO FACE EVALUATION WAS PERFORMED  Joshua Harmon 08/11/2024, 3:54 PM

## 2024-08-11 NOTE — Progress Notes (Signed)
 Physical Therapy Session Note  Patient Details  Name: Joshua Harmon MRN: [id number] Date of Birth: 10-29-1961  Today's Date: 08/11/2024 PT Individual Time: 1015-1058 PT Individual Time Calculation (min): 43 min   Short Term Goals: Week 1:  PT Short Term Goal 1 (Week 1): Pt will complete bed mobility with MinA PT Short Term Goal 2 (Week 1): Pt will complete SPT using MinA + LRAD PT Short Term Goal 3 (Week 1): Pt will complete 20 ft amb using modA + LRAD PT Short Term Goal 4 (Week 1): Pt will initiate stair training  Skilled Therapeutic Interventions/Progress Updates: Pt presents sitting EOB at the bottom of side rail arguing w/ NT.  Pt agreeable to therapy and then states need to use the BR.  Pt performed sit to stand and then SPT bed> w/c from elevated height and mod A.  Pt requires manual A for placement of L foot and constant attention to L hand.  Pt wheeled into BR and performed SPT w/ mod A but pt having incontinent episode of bladder before sitting on commode.  Pt continent of bowel and bladder in toilet, charted in Flowsheets.  PT changes pants while seated and cues for cleaning of legs w/ washclothes.  Pt transfers sit to stand and faces hand rail, performing pericare fair performance, but still requires PT to complete.Pt wheeled to sink for hand washing and then brushing teeth.  Pt applied deodorant w/ hand over hand for R axilla, and then donned pull-over shirt w/ mod A especially for attention to LUE.  Pt remained sitting in TIS w/ chair alarm on and all needs in reach.      Therapy Documentation Precautions:  Precautions Precautions: Fall Recall of Precautions/Restrictions: Impaired Precaution/Restrictions Comments: Poor awareness, L lean, ataxic Restrictions Weight Bearing Restrictions Per Provider Order: No General:   Vital Signs:   Pain:0/10      Therapy/Group: Individual Therapy  Tiann Saha P Jett Fukuda 08/11/2024, 10:59 AM

## 2024-08-12 DIAGNOSIS — D496 Neoplasm of unspecified behavior of brain: Secondary | ICD-10-CM | POA: Diagnosis not present

## 2024-08-12 DIAGNOSIS — K5900 Constipation, unspecified: Secondary | ICD-10-CM | POA: Diagnosis not present

## 2024-08-12 NOTE — Progress Notes (Signed)
 PROGRESS NOTE   Subjective/Complaints: Seen in his room watching TV.  No new complaints or concerns this morning   ROS: Patient denies chills, rash, sore throat, blurred vision, dizziness, nausea, vomiting, diarrhea, cough, shortness of breath or chest pain, joint or back/neck pain,   or mood change.  Denies new motor or sensory changes.  Objective:   No results found.  No results for input(s): WBC, HGB, HCT, PLT in the last 72 hours.  No results for input(s): NA, K, CL, CO2, GLUCOSE, BUN, CREATININE, CALCIUM in the last 72 hours.    Intake/Output Summary (Last 24 hours) at 08/12/2024 1259 Last data filed at 08/12/2024 0752 Gross per 24 hour  Intake 462 ml  Output 650 ml  Net -188 ml        Physical Exam: Vital Signs Blood pressure 117/84, pulse 74, temperature 97.7 F (36.5 C), temperature source Oral, resp. rate 16, height 6' 1 (1.854 m), weight 65.6 kg, SpO2 96%. Constitutional: No distress . Vital signs reviewed.  Sitting in his wheelchair watching television, appears comfortable HEENT: right crani site CDI with staples.  EOMI, oral membranes moist Neck: supple Cardiovascular: RRR without murmur. No JVD    Respiratory/Chest: Clear to auscultation bilaterally, nonlabored breathing GI/Abdomen: BS +, non-tender, non-distended, soft Ext: no clubbing, cyanosis, or edema Psych: pleasant and cooperative  Skin: C/D/I. No apparent lesions. .  Well-approximated.  Neurologic exam:    Pt alert and oriented x 3. Right gaze preference./Left hemineglect--ongoing Some mild difficulty with attention and insight   MMT: RUE and RLE grossly 5/5 prox to distal.  LUE 4 to 4+ delt, biceps, triceps, wrist and HI. LLE 4/5 HF, KE and 2-3/5 ADF/PF.  --Unchanged Sensation appears grossly intact to LT and pain in all 4's.  DTR's 1+.  No apparent ataxia No apparent resting tone. MSK: Much improved range of  motion of neck with bilateral rotation, sidebending.  Physical exam unchanged from the above on reexamination 08/12/24    Assessment/Plan: 1. Functional deficits which require 3+ hours per day of interdisciplinary therapy in a comprehensive inpatient rehab setting. Physiatrist is providing close team supervision and 24 hour management of active medical problems listed below. Physiatrist and rehab team continue to assess barriers to discharge/monitor patient progress toward functional and medical goals  Care Tool:  Bathing    Body parts bathed by patient: Left arm, Chest, Abdomen, Front perineal area, Buttocks, Right upper leg, Left upper leg, Face   Body parts bathed by helper: Right lower leg, Left lower leg Body parts n/a: Right arm   Bathing assist Assist Level: Maximal Assistance - Patient 24 - 49%     Upper Body Dressing/Undressing Upper body dressing   What is the patient wearing?: Pull over shirt    Upper body assist Assist Level: Maximal Assistance - Patient 25 - 49%    Lower Body Dressing/Undressing Lower body dressing      What is the patient wearing?: Underwear/pull up, Pants     Lower body assist Assist for lower body dressing: Total Assistance - Patient < 25%     Toileting Toileting    Toileting assist Assist for toileting: Moderate Assistance - Patient 50 -  74%     Transfers Chair/bed transfer  Transfers assist     Chair/bed transfer assist level: Moderate Assistance - Patient 50 - 74%     Locomotion Ambulation   Ambulation assist   Ambulation activity did not occur: Safety/medical concerns (2/2 strong posture deficit and fatigue)          Walk 10 feet activity   Assist  Walk 10 feet activity did not occur: Safety/medical concerns (2/2 strong posture deficit and fatigue)        Walk 50 feet activity   Assist Walk 50 feet with 2 turns activity did not occur: Safety/medical concerns (2/2 strong posture deficit and fatigue)          Walk 150 feet activity   Assist Walk 150 feet activity did not occur: Safety/medical concerns (2/2 strong posture deficit and fatigue)         Walk 10 feet on uneven surface  activity   Assist Walk 10 feet on uneven surfaces activity did not occur: Safety/medical concerns (2/2 strong posture deficit and fatigue)         Wheelchair     Assist Is the patient using a wheelchair?: Yes Type of Wheelchair: Manual    Wheelchair assist level: Dependent - Patient 0% Max wheelchair distance: 333ft    Wheelchair 50 feet with 2 turns activity    Assist        Assist Level: Dependent - Patient 0%   Wheelchair 150 feet activity     Assist      Assist Level: Dependent - Patient 0%   Blood pressure 117/84, pulse 74, temperature 97.7 F (36.5 C), temperature source Oral, resp. rate 16, height 6' 1 (1.854 m), weight 65.6 kg, SpO2 96%.  Medical Problem List and Plan: 1. 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 Janjua 9/8             -patient may  shower             -ELOS/Goals: 12-14 days, supervision to min assist with self-care and mobility, mod I to supervision with cognition  -Continue CIR therapies including PT, OT, and SLP  - 10/8 DC   - 9/16: On/off confusion for night shift; telesitter started yesterday. Poor proprioception in LUE and LLE; difficulty with L sided attention. Max A LBD, Mod-Max A transfers, making very good progress. Mod A for bed mobility and transfers, walked 50 ft with Min A x2. L ataxia. Perseverative. Mod-severe issues with memory and problem solving. L oral pocketing.    - Will need note for driving restrictions  -0-81: Dr. Eward NP will follow-up with patient , they will be meeting with him at 1 PM this coming Tuesday.  Unsure of plan at this time.  May need to facilitate early discharge if chemo/radiation therapy required urgently--discussed with family 9/19   2.   Antithrombotics: -DVT/anticoagulation:  Mechanical:  Antiembolism stockings, knee (TED hose) Bilateral lower extremities Sequential compression devices, below knee Bilateral lower extremities             -antiplatelet therapy: N/A 3. Pain Management: Tylenol , Fioricet as needed  - no pain endorsed 4. Mood/Behavior/Sleep: LCSW to follow for evaluation and support when available.              -antipsychotic agents: N/A   - 9/16: Started on telesitter overnight for impulsivity; will start sleep log + trazodone  50 mg at bedtime PRN--sleep log appropriate  5. Neuropsych/cognition: This  patient is not capable of making decisions on his own behalf.   - 9/16: start ritalin  5 mg BID for attention and lethargy--continue trial, arousal much improved but concentration remains difficult  9-19: Per patient's fianc, seen considerable improvements.  Ongoing severe deficits per therapy note; concurrent continue current regimen  6. Skin/Wound Care: Routine pressure-relief measures             -Per neurosurgery staple removal planned for this week (POD #10 9/18)--confirmed with Dr. Rosslyn, order placed to remove staples 9-19  7. Fluids/Electrolytes/Nutrition: Monitor intake and output and routine follow-up labs in a.m.              - on regular/ thins diet             - GERD: continue PPI  8.  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 Janjua on 9/12 decrease to 2 mg q8h indefinitely until radiation treatment begins - 9-19: IDH testing unable to be performed due to insufficient sample; per Dr. Buckley, will not alter or delay treatment, will send out  9.  Impaired glucose tolerance: Secondary to steroids CBGs have been below 180 has not required coverage.              - CBG monitoring was every 4 hours now 2x daily --has been stable, continue to hold SSI   - 9/15: CBG remain between 100 and 150; can stop  CBGs    10.  Constipation: pt had bm yesterday -senokot-s 1 tab at bedtime -miralax  prn             Last bowel movement 9-17, small x2  Lbm today 9/21  11.  Leukocytosis.  likely secondary to Decadron . check urine and chest x-ray today to screen for secondary causes.  Low threshold for blood cultures and broad-spectrum antibiotics if he develops vital instability.  - 9-14: Urinalysis clear, chest x-ray with no acute process.  Vitally stable, afebrile.  Trend  9/15 sl increase to 15k today. Pt afebrile, no s/s infection. UA/CXR as above  No signs of infection noted continue to monitor  12.  Right neck tone/L hemineglect.  Start baclofen  5 mg 3 times daily.  Not currently bothersome, but want to prevent creasing and skin breakdown if possible.  - 9-14: Head positioning much better today, likely more related to hemineglect.  Move baclofen  to 3 times daily as needed.  9/15 pt fairly neutral today--left inattention is biggest factor  -18: Range of motion, inattention improving.  LOS: 9 days A FACE TO FACE EVALUATION WAS PERFORMED  Murray Collier 08/12/2024, 12:59 PM

## 2024-08-12 NOTE — Progress Notes (Signed)
 Physical Therapy Weekly Progress Note  Patient Details  Name: Joshua Harmon MRN: [id number] Date of Birth: 21-Apr-1961  Beginning of progress report period: August 04, 2024 End of progress report period: August 12, 2024  Today's Date: 08/12/2024 PT Individual Time: 1000-1040 PT Individual Time Calculation (min): 40 min   Session focused on NMR to address postural control, ataxia, L sided weakness and control/coordination, and transitional movements during transfers and then with use of Nustep for closed chain activity for BUE/BLE reciprocal movement pattern retraining and generalized strengthening and endurance. Pt requires extra time for processing and multimodal cues for transfers - focused on more of a squat pivot vs full standing transfer in attempt to control movement better. Demonstrates R lateral lean and difficulty with head hips relationship but when able to coordinate it, can be min A for parts of transfer and then mod A for the rest. On nustep completed 10 min total with several rest breaks - some cues needed for attention to LLE placement but overall was able to maintain and fix positioning with only occasional cues and extra time to coordinate L hand. Handoff to nursing in room at end of session.     Patient has met 0 of 4 short term goals.  Pt is making good functional progress overall requiring overall mod A at this time for transfers and +2 still for gait due to ataxia, impaired L attention, L hemiplegia and postural control. Pt remains motivated.   Patient continues to demonstrate the following deficits muscle weakness and muscle joint tightness, decreased cardiorespiratoy endurance, impaired timing and sequencing, unbalanced muscle activation, ataxia, decreased coordination, and decreased motor planning, decreased visual perceptual skills, decreased midline orientation and decreased attention to left, decreased attention, decreased awareness, decreased problem solving,  decreased safety awareness, and decreased memory, and decreased sitting balance, decreased standing balance, decreased postural control, hemiplegia, and decreased balance strategies and therefore will continue to benefit from skilled PT intervention to increase functional independence with mobility.  Patient progressing toward long term goals..  Continue plan of care.  PT Short Term Goals Week 1:  PT Short Term Goal 1 (Week 1): Pt will complete bed mobility with MinA PT Short Term Goal 1 - Progress (Week 1): Progressing toward goal PT Short Term Goal 2 (Week 1): Pt will complete SPT using MinA + LRAD PT Short Term Goal 2 - Progress (Week 1): Progressing toward goal PT Short Term Goal 3 (Week 1): Pt will complete 20 ft amb using modA + LRAD PT Short Term Goal 3 - Progress (Week 1): Progressing toward goal PT Short Term Goal 4 (Week 1): Pt will initiate stair training PT Short Term Goal 4 - Progress (Week 1): Not met Week 2:  PT Short Term Goal 1 (Week 2): Pt will complete bed mobility with min A PT Short Term Goal 2 (Week 2): Pt will perform transfers with min A PT Short Term Goal 3 (Week 2): Pt will perform gait x 25' with mod+2  Skilled Therapeutic Interventions/Progress Updates:  Ambulation/gait training;Functional mobility training;Therapeutic Activities;Balance/vestibular training;Neuromuscular re-education;Therapeutic Exercise;Cognitive remediation/compensation;Pain management;UE/LE Strength taining/ROM;Community reintegration;Patient/family education;UE/LE Coordination activities;Discharge planning;Psychosocial support;Visual/perceptual remediation/compensation;Disease management/prevention;Skin care/wound management;Wheelchair propulsion/positioning;DME/adaptive equipment instruction;Splinting/orthotics;Functional electrical stimulation;Stair training   Therapy Documentation Precautions:  Precautions Precautions: Fall Recall of Precautions/Restrictions:  Impaired Precaution/Restrictions Comments: Poor awareness, L lean, ataxic Restrictions Weight Bearing Restrictions Per Provider Order: No   Pain:  Denies pain.    Therapy/Group: Individual Therapy  Elnor Pizza Sherrell Pizza WENDI Elnor, PT, DPT, CBIS 08/12/2024, 12:06 PM

## 2024-08-12 NOTE — Progress Notes (Incomplete)
 Physical Therapy Session Note  Patient Details  Name: SIRCHARLES HOLZHEIMER MRN: [id number] Date of Birth: 05-26-1961  Today's Date: 08/12/2024 PT Individual Time:  -      Short Term Goals: Week 1:  PT Short Term Goal 1 (Week 1): Pt will complete bed mobility with MinA PT Short Term Goal 2 (Week 1): Pt will complete SPT using MinA + LRAD PT Short Term Goal 3 (Week 1): Pt will complete 20 ft amb using modA + LRAD PT Short Term Goal 4 (Week 1): Pt will initiate stair training  Skilled Therapeutic Interventions/Progress Updates:      Therapy Documentation Precautions:  Precautions Precautions: Fall Recall of Precautions/Restrictions: Impaired Precaution/Restrictions Comments: Poor awareness, L lean, ataxic Restrictions Weight Bearing Restrictions Per Provider Order: No General:   Vital Signs: Therapy Vitals Temp: 97.7 F (36.5 C) Temp Source: Oral Pulse Rate: 74 Resp: 16 BP: 117/84 Patient Position (if appropriate): Lying Oxygen Therapy SpO2: 96 % O2 Device: Room Air Pain:   Mobility:   Locomotion :    Trunk/Postural Assessment :    Balance:   Exercises:   Other Treatments:      Therapy/Group: Individual Therapy  Elnor Pizza Sherrell Pizza WENDI Elnor, PT, DPT, CBIS  08/12/2024, 6:48 AM

## 2024-08-13 DIAGNOSIS — D496 Neoplasm of unspecified behavior of brain: Secondary | ICD-10-CM | POA: Diagnosis not present

## 2024-08-13 LAB — CBC WITH DIFFERENTIAL/PLATELET
Abs Immature Granulocytes: 0.31 K/uL — ABNORMAL HIGH (ref 0.00–0.07)
Basophils Absolute: 0 K/uL (ref 0.0–0.1)
Basophils Relative: 0 %
Eosinophils Absolute: 0 K/uL (ref 0.0–0.5)
Eosinophils Relative: 0 %
HCT: 47.6 % (ref 39.0–52.0)
Hemoglobin: 15.9 g/dL (ref 13.0–17.0)
Immature Granulocytes: 2 %
Lymphocytes Relative: 8 %
Lymphs Abs: 1.3 K/uL (ref 0.7–4.0)
MCH: 30.7 pg (ref 26.0–34.0)
MCHC: 33.4 g/dL (ref 30.0–36.0)
MCV: 91.9 fL (ref 80.0–100.0)
Monocytes Absolute: 0.9 K/uL (ref 0.1–1.0)
Monocytes Relative: 5 %
Neutro Abs: 14.3 K/uL — ABNORMAL HIGH (ref 1.7–7.7)
Neutrophils Relative %: 85 %
Platelets: 344 K/uL (ref 150–400)
RBC: 5.18 MIL/uL (ref 4.22–5.81)
RDW: 13.4 % (ref 11.5–15.5)
WBC: 16.9 K/uL — ABNORMAL HIGH (ref 4.0–10.5)
nRBC: 0 % (ref 0.0–0.2)

## 2024-08-13 LAB — COMPREHENSIVE METABOLIC PANEL WITH GFR
ALT: 48 U/L — ABNORMAL HIGH (ref 0–44)
AST: 22 U/L (ref 15–41)
Albumin: 3.1 g/dL — ABNORMAL LOW (ref 3.5–5.0)
Alkaline Phosphatase: 57 U/L (ref 38–126)
Anion gap: 14 (ref 5–15)
BUN: 18 mg/dL (ref 8–23)
CO2: 20 mmol/L — ABNORMAL LOW (ref 22–32)
Calcium: 8.9 mg/dL (ref 8.9–10.3)
Chloride: 99 mmol/L (ref 98–111)
Creatinine, Ser: 0.88 mg/dL (ref 0.61–1.24)
GFR, Estimated: >60 mL/min (ref >60)
Glucose, Bld: 98 mg/dL (ref 70–99)
Potassium: 4.3 mmol/L (ref 3.5–5.1)
Sodium: 133 mmol/L — ABNORMAL LOW (ref 135–145)
Total Bilirubin: 0.7 mg/dL (ref 0.0–1.2)
Total Protein: 6.3 g/dL — ABNORMAL LOW (ref 6.5–8.1)

## 2024-08-13 NOTE — Plan of Care (Signed)
  Problem: Consults Goal: RH GENERAL PATIENT EDUCATION Description: See Patient Education module for education specifics. Outcome: Progressing   Problem: RH BOWEL ELIMINATION Goal: RH STG MANAGE BOWEL WITH ASSISTANCE Description: STG Manage Bowel with supervision-min  Assistance. Outcome: Progressing   Problem: RH BLADDER ELIMINATION Goal: RH STG MANAGE BLADDER WITH ASSISTANCE Description: STG Manage Bladder With supervision-min Assistance Outcome: Progressing   Problem: RH SKIN INTEGRITY Goal: RH STG SKIN FREE OF INFECTION/BREAKDOWN Description: Manage skin free of infection with supervision- min assistance  Outcome: Progressing   Problem: RH SAFETY Goal: RH STG ADHERE TO SAFETY PRECAUTIONS W/ASSISTANCE/DEVICE Description: STG Adhere to Safety Precautions With supervision-min Assistance/Device. Outcome: Progressing   Problem: RH PAIN MANAGEMENT Goal: RH STG PAIN MANAGED AT OR BELOW PT'S PAIN GOAL Description: < 4 w/ prns Outcome: Progressing   Problem: RH KNOWLEDGE DEFICIT GENERAL Goal: RH STG INCREASE KNOWLEDGE OF SELF CARE AFTER HOSPITALIZATION Description: Manage increase knowledge of self care after hospitalization with supervision- min assistance from partner using educational materials provided Outcome: Progressing   Problem: RH COGNITION-NURSING Goal: RH STG USES MEMORY AIDS/STRATEGIES W/ASSIST TO PROBLEM SOLVE Description: STG Uses Memory Aids/Strategies With min- supervision Assistance to Problem Solve. Outcome: Progressing   Problem: RH KNOWLEDGE DEFICIT BRAIN INJURY Goal: RH STG INCREASE KNOWLEDGE OF SELF CARE AFTER BRAIN INJURY Description: Increase knowledge of self care after brain injury with min-supervision assistance Outcome: Progressing

## 2024-08-13 NOTE — Progress Notes (Signed)
 Speech Language Pathology Daily Session Note  Patient Details  Name: Joshua Harmon MRN: [id number] Date of Birth: 1961-04-22  Today's Date: 08/13/2024 SLP Individual Time: 1100-1200 SLP Individual Time Calculation (min): 60 min  Short Term Goals: Week 2: SLP Short Term Goal 1 (Week 2): Pt will utilize compensatory memory strategies as needed to recall recent/relevant info w/ modA SLP Short Term Goal 2 (Week 2): Pt will sustain attention to task in a mildly distracting environment for ~10 mins given modA SLP Short Term Goal 3 (Week 2): Pt will solve functional problems w/ modA SLP Short Term Goal 4 (Week 2): Pt will improve L visual attention to modA  Skilled Therapeutic Interventions:   Pt greeted in his room and was up in his TIS WC upon SLP arrival. He was agreeable to tx tasks targeting cognition. He was initially perseverative on his missing phone and required modA cues for problem solving. Once it was found, structured tx tasks were facilitated in the ST office. He was independently oriented for the first time to date! Anticipate his girlfriend's birthday assisted with this. He then completed a written task targeting L visual attention, sustained attention, problem solving, and organization. He benefited from modA overall. He also completed a money management task calculating bills/coins. He benefited from [facility] for L visual attention and working memory. At the end of tx tasks, he was left in his chair w/ the alarm set and call light within reach. Recommend cont ST per POC.   Pain  No pain reported  Therapy/Group: Individual Therapy  [name] 08/13/2024, 8:09 AM

## 2024-08-13 NOTE — Progress Notes (Signed)
 Occupational Therapy Session Note  Patient Details  Name: [name] MRN: [id number] Date of Birth: 08-23-1961  Today's Date: 08/13/2024 OT Individual Time: 9154-9069 OT Individual Time Calculation (min): 45 min    Short Term Goals: Week 2:  OT Short Term Goal 1 (Week 2): Pt will complete LB dressing with min A OT Short Term Goal 2 (Week 2): Pt will complete stand pivot with min A OT Short Term Goal 3 (Week 2): Pt will complete sit > stand with min A OT Short Term Goal 4 (Week 2): Pt will right head/trunk to midline with no more than mod cueing  Skilled Therapeutic Interventions/Progress Updates:    Pt received supine with no c/ o pain, agreeable to OT session starting with shower. He came to EOB with (S), scooting dangerously close to the edge. He stood with min questioning cue to bring BLE back prior to stand. He stood with mod A, skilled cueing for body mechanics and leaning forward at the trunk. He attempted to use the urinal in standing but then opted to complete seated. Min A to position urinal. He then completed functional mobility with the RW into the bathroom for shower transfer. Mod A required at the trunk with mod-max cueing for L attention. He demonstrate significant motor planning deficits when attempting to turn into the shower. Mod A to remove shirt 2/2 it being very tight. He completed UB bathing with (S) using BUE with cueing for LUE integration. He stood with heavy use of the grab bar and was able to prop his R shoulder on the wall to then complete peri hygiene in standing with BUE. Min A provided at the trunk. He transferred back to the w/c following, mod A for functional mobility with similar mod cueing required for motor planning and sequencing transfer. He sat EOB to dress. He demonstrated motor planning deficits that limited dressing- mod A to don shirt and pants. He continues to have difficulty donning socks, requiring max A. He then completed 100 ft of functional  mobility with the RW with mod A at the trunk- requiring HOH to keep the LUE on the RW and cueing for upright trunk/head at midline. Mobility completed to challenge trunk control, BLE He returned to his room following. Pt was left sitting up in the wheelchair with all needs met, chair alarm set, and call bell within reach.    Therapy Documentation Precautions:  Precautions Precautions: Fall Recall of Precautions/Restrictions: Impaired Precaution/Restrictions Comments: Poor awareness, L lean, ataxic Restrictions Weight Bearing Restrictions Per Provider Order: No  Therapy/Group: Individual Therapy  Nena VEAR Moats 08/13/2024, 8:02 AM

## 2024-08-13 NOTE — Progress Notes (Signed)
 PROGRESS NOTE   Subjective/Complaints:  No events overnight.  No acute complaints.  Patient feels he is doing very well, has no needs today.  Eating well, sleeping well. Vital stable overnight.  Intermittent mild tachycardia, patient remains asymptomatic. Labs this a.m. significant for mild hyponatremia 133, improving albumin and LFTs.  WBC slightly up trended from 15.0-16.9.  No fevers.  ROS: Patient denies chills, rash, sore throat, blurred vision, dizziness, nausea, vomiting, diarrhea, cough, shortness of breath or chest pain, joint or back/neck pain,   or mood change.  Denies new motor or sensory changes.  Objective:   No results found.  No results for input(s): WBC, HGB, HCT, PLT in the last 72 hours.  No results for input(s): NA, K, CL, CO2, GLUCOSE, BUN, CREATININE, CALCIUM in the last 72 hours.    Intake/Output Summary (Last 24 hours) at 08/13/2024 0826 Last data filed at 08/13/2024 0739 Gross per 24 hour  Intake 480 ml  Output 900 ml  Net -420 ml        Physical Exam: Vital Signs Blood pressure (!) 119/90, pulse 70, temperature 98.7 F (37.1 C), resp. rate 19, height 6' 1 (1.854 m), weight 65.6 kg, SpO2 97%. Constitutional: No distress . Vital signs reviewed.  Sitting up in wheelchair HEENT: right crani site CDI.  EOMI, oral membranes moist Neck: supple Cardiovascular: RRR without murmur. No JVD    Respiratory/Chest: Clear to auscultation bilaterally, nonlabored breathing GI/Abdomen: BS +, non-tender, non-distended, soft Ext: no clubbing, cyanosis, or edema Psych: pleasant and cooperative  Skin: C/D/I. No apparent lesions. .  Well-approximated.  Right crani site with staples removed.  Neurologic exam:    Pt alert and oriented x 3. Right gaze preference./Left hemineglect--ongoing Some moderate difficulty with attention and insight   MMT: RUE and RLE grossly 5/5 prox to distal.   LUE 4 to 4+ delt, biceps, triceps, wrist and HI. LLE 4/5 HF, KE and 2-3/5 ADF/PF.  --Unchanged Sensation appears grossly intact to LT and pain in all 4's.  DTR's 1+.  No apparent ataxia No apparent resting tone. MSK: Ongoing right head tilt, can bring to midline with stimulation.    Assessment/Plan: 1. Functional deficits which require 3+ hours per day of interdisciplinary therapy in a comprehensive inpatient rehab setting. Physiatrist is providing close team supervision and 24 hour management of active medical problems listed below. Physiatrist and rehab team continue to assess barriers to discharge/monitor patient progress toward functional and medical goals  Care Tool:  Bathing    Body parts bathed by patient: Left arm, Chest, Abdomen, Front perineal area, Buttocks, Right upper leg, Left upper leg, Face   Body parts bathed by helper: Right lower leg, Left lower leg Body parts n/a: Right arm   Bathing assist Assist Level: Maximal Assistance - Patient 24 - 49%     Upper Body Dressing/Undressing Upper body dressing   What is the patient wearing?: Pull over shirt    Upper body assist Assist Level: Maximal Assistance - Patient 25 - 49%    Lower Body Dressing/Undressing Lower body dressing      What is the patient wearing?: Underwear/pull up, Pants     Lower body assist Assist  for lower body dressing: Total Assistance - Patient < 25%     Toileting Toileting    Toileting assist Assist for toileting: Moderate Assistance - Patient 50 - 74%     Transfers Chair/bed transfer  Transfers assist     Chair/bed transfer assist level: Moderate Assistance - Patient 50 - 74%     Locomotion Ambulation   Ambulation assist   Ambulation activity did not occur: Safety/medical concerns (2/2 strong posture deficit and fatigue)          Walk 10 feet activity   Assist  Walk 10 feet activity did not occur: Safety/medical concerns (2/2 strong posture deficit and  fatigue)        Walk 50 feet activity   Assist Walk 50 feet with 2 turns activity did not occur: Safety/medical concerns (2/2 strong posture deficit and fatigue)         Walk 150 feet activity   Assist Walk 150 feet activity did not occur: Safety/medical concerns (2/2 strong posture deficit and fatigue)         Walk 10 feet on uneven surface  activity   Assist Walk 10 feet on uneven surfaces activity did not occur: Safety/medical concerns (2/2 strong posture deficit and fatigue)         Wheelchair     Assist Is the patient using a wheelchair?: Yes Type of Wheelchair: Manual    Wheelchair assist level: Dependent - Patient 0% Max wheelchair distance: 349ft    Wheelchair 50 feet with 2 turns activity    Assist        Assist Level: Dependent - Patient 0%   Wheelchair 150 feet activity     Assist      Assist Level: Dependent - Patient 0%   Blood pressure (!) 119/90, pulse 70, temperature 98.7 F (37.1 C), resp. rate 19, height 6' 1 (1.854 m), weight 65.6 kg, SpO2 97%.  Medical Problem List and Plan: 1. 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 Janjua 9/8             -patient may  shower             -ELOS/Goals: 12-14 days, supervision to min assist with self-care and mobility, mod I to supervision with cognition  -Continue CIR therapies including PT, OT, and SLP  -pending, see below   - 9/16: On/off confusion for night shift; telesitter started yesterday. Poor proprioception in LUE and LLE; difficulty with L sided attention. Max A LBD, Mod-Max A transfers, making very good progress. Mod A for bed mobility and transfers, walked 50 ft with Min A x2. L ataxia. Perseverative. Mod-severe issues with memory and problem solving. L oral pocketing.    - Will need note for driving restrictions  -0-81: Dr. Eward NP will follow-up with patient , they will be meeting with him at 1 PM this coming  Tuesday.  Unsure of plan at this time.  May need to facilitate early discharge if chemo/radiation therapy required urgently--discussed with family 9/19--appointment changed to 9-30 at 2 PM, with plans to discharge to appointment per Dr. Buckley  -- Teams tomorrow a.m., will need to discuss adjustment of goals to accommodate timeline for discharge  2.  Antithrombotics: -DVT/anticoagulation:  Mechanical:  Antiembolism stockings, knee (TED hose) Bilateral lower extremities Sequential compression devices, below knee Bilateral lower extremities             -antiplatelet therapy: N/A  3. Pain Management: Tylenol , Fioricet as  needed  - no pain endorsed  4. Mood/Behavior/Sleep: LCSW to follow for evaluation and support when available.              -antipsychotic agents: N/A   - 9/16: Started on telesitter overnight for impulsivity; will start sleep log + trazodone  50 mg at bedtime PRN--sleep log appropriate  5. Neuropsych/cognition: This patient is not capable of making decisions on his own behalf.   - 9/16: start ritalin  5 mg BID for attention and lethargy--continue trial, arousal much improved but concentration remains difficult  9-19: Per patient's fianc, seen considerable improvements.  Ongoing severe deficits per therapy note; concurrent continue current regimen  9-22: Remains very poor insight, no appreciable improvements.  6. Skin/Wound Care: Routine pressure-relief measures             -Per neurosurgery staple removal planned for this week (POD #10 9/18)--confirmed with Dr. Rosslyn, order placed to remove staples 9-19  7. Fluids/Electrolytes/Nutrition: Monitor intake and output and routine follow-up labs in a.m.              - on regular/ thins diet             - GERD: continue PPI  8.  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 Janjua on 9/12 decrease to 2 mg q8h indefinitely until  radiation treatment begins - 9-19: IDH testing unable to be performed due to insufficient sample; per Dr. Buckley, will not alter or delay treatment, will send out  9.  Impaired glucose tolerance: Secondary to steroids CBGs have been below 180 has not required coverage.              - CBG monitoring was every 4 hours now 2x daily --has been stable, continue to hold SSI   - 9/15: CBG remain between 100 and 150; can stop CBGs    10.  Constipation: pt had bm yesterday -senokot-s 1 tab at bedtime -miralax  prn             Last bowel movement 9-17, small x2  Lbm today 9/21  11.  Leukocytosis.  likely secondary to Decadron . check urine and chest x-ray today to screen for secondary causes.  Low threshold for blood cultures and broad-spectrum antibiotics if he develops vital instability.  - 9-14: Urinalysis clear, chest x-ray with no acute process.  Vitally stable, afebrile.  Trend  9/15 sl increase to 15k today. Pt afebrile, no s/s infection. UA/CXR as above  9-22: Remains slightly elevated on Decadron  2 mg every 8; afebrile, no signs of infection, monitor.  Will need to clarify if any further taper closer to discharge.  12.  Right neck tone/L hemineglect.  Start baclofen  5 mg 3 times daily.  Not currently bothersome, but want to prevent creasing and skin breakdown if possible.  - 9-14: Head positioning much better today, likely more related to hemineglect.  Move baclofen  to 3 times daily as needed.  9/15 pt fairly neutral today--left inattention is biggest factor  -18: Range of motion, inattention improving.  LOS: 10 days A FACE TO FACE EVALUATION WAS PERFORMED  Joshua Harmon Likes 08/13/2024, 8:26 AM

## 2024-08-13 NOTE — Progress Notes (Addendum)
 Speech Language Pathology Daily Session Note  Patient Details  Name: Joshua Harmon MRN: [id number] Date of Birth: 07-Feb-1961  Today's Date: 08/13/2024 SLP Individual Time: 0247-0338 SLP Individual Time Calculation (min): 51 min  Short Term Goals: Week 2: SLP Short Term Goal 1 (Week 2): Pt will utilize compensatory memory strategies as needed to recall recent/relevant info w/ modA SLP Short Term Goal 2 (Week 2): Pt will sustain attention to task in a mildly distracting environment for ~10 mins given modA SLP Short Term Goal 3 (Week 2): Pt will solve functional problems w/ modA SLP Short Term Goal 4 (Week 2): Pt will improve L visual attention to modA  Skilled Therapeutic Interventions: Patient was seen in PM to address cognitive re- training. Pt was alert upon SLP arrival and agreeable for session.Pt oriented to time, place and situation indep. He recalled participation and completion of PT and SLP sessions earlier in the day including specific tasks completed given min A. Pt agreeable for session to be completed throughout hospital this date. SLP challenging pt's L attention through various tasks throughout session. Through a scavenger hunt across CIR unit and hospital, pt attended to left side of an educational board to identify nutritional information along with maps to locate units and places around hospital. Pt warranting between mod A throughout tasks. SLP also addressing memory through challenging pt in functional recall of room number, floor, etc. Pt recalled information given a distracted delay with min to mod A. At conclusion of session, pt was left upright in [facility] with call button within reach and chair alarm active. SLP to continue POC.   Pain Pain Assessment Pain Scale: 0-10 Pain Score: 0-No pain  Therapy/Group: Individual Therapy  Joane GORMAN Fuss 08/13/2024, 3:48 PM

## 2024-08-13 NOTE — Plan of Care (Signed)
  Problem: RH SKIN INTEGRITY Goal: RH STG SKIN FREE OF INFECTION/BREAKDOWN Description: Manage skin free of infection with supervision- min assistance  Outcome: Progressing

## 2024-08-13 NOTE — Progress Notes (Signed)
 Patient ID: Joshua Harmon, male   DOB: 09/15/1961, 63 y.o.   MRN: [id number]   [phone]- SW returned phone call to pt sister Joshua Harmon who wanted to know about the advanced care directive her brother completed and a notary. SW shared once the form is completed and brought in, pt can inform nursing for chaplain consult. She inquired about POA SW explained will have to hire an attorney for this service. She will look into this. No other questions/concerns reported.   [name] Jude, MSW, LCSW Office: [phone] Cell: [phone] Fax: [phone]

## 2024-08-13 NOTE — Progress Notes (Signed)
 Physical Therapy Session Note  Patient Details  Name: Joshua Harmon MRN: [id number] Date of Birth: 21-Aug-1961  Today's Date: 08/13/2024 PT Individual Time: 1005-1045 PT Individual Time Calculation (min): 40 min   Short Term Goals: Week 2:  PT Short Term Goal 1 (Week 2): Pt will complete bed mobility with min A PT Short Term Goal 2 (Week 2): Pt will perform transfers with min A PT Short Term Goal 3 (Week 2): Pt will perform gait x 25' with mod+2  Skilled Therapeutic Interventions/Progress Updates:    Pt presents in room in [facility], agreeable to PT. Pt denies pain at this time. Session focused on gait training with RW for device management and gait mechanics as well as multidirectional stability, as well as therapeutic activity for transfer training. Pt transported to day room. Pt completes sit to stand to RW with mod cues and min assist for LUE hand placement on RW and cues for RUE pushing from arm rest, min assist to stand for immediate standing balance. Pt ambulates with RW 180' with min/mod assist with cues for LUE placement on RW and LLE foot placement, pt demonstrating flexed knee throughout gait with swing and stance phase. Pt demonstrating posterior LOB with transfer back to [facility] requiring mod assist to control stand to sit back to WC. Pt completes forward/backward ambulation 3x10' with RW with min assist and cues for sequencing as well as cues for increasing proximity to RW. Pt then completes gait around 2 cones in figure 8 pattern with min assist and mod cues for sequencing and attention to L side, pt takes exceedingly large R turn. Pt provided with cues throughout session for sequencing stepping back to sit in WC, letting BLEs touch sitting surface, pause, then reach back with R hand to WC armrest to sit. Pt returns to room and remains seated in [facility] with all needs within reach, cal light in place and chair alarm donned and activated at end of session.  Therapy Documentation Precautions:   Precautions Precautions: Fall Recall of Precautions/Restrictions: Impaired Precaution/Restrictions Comments: Poor awareness, L lean, ataxic Restrictions Weight Bearing Restrictions Per Provider Order: No   Therapy/Group: Individual Therapy  Reche Ohara PT, DPT 08/13/2024, 12:45 PM

## 2024-08-14 DIAGNOSIS — D496 Neoplasm of unspecified behavior of brain: Secondary | ICD-10-CM | POA: Diagnosis not present

## 2024-08-14 LAB — SODIUM, URINE, RANDOM: Sodium, Ur: 87 mmol/L

## 2024-08-14 LAB — OSMOLALITY, URINE: Osmolality, Ur: 565 mosm/kg (ref 300–900)

## 2024-08-14 NOTE — Progress Notes (Signed)
 Speech Language Pathology Daily Session Note  Patient Details  Name: Joshua Harmon MRN: [id number] Date of Birth: 1961/03/22  Today's Date: 08/14/2024 SLP Individual Time: 9199-9154 SLP Individual Time Calculation (min): 45 min  Short Term Goals: Week 2: SLP Short Term Goal 1 (Week 2): Pt will utilize compensatory memory strategies as needed to recall recent/relevant info w/ modA SLP Short Term Goal 2 (Week 2): Pt will sustain attention to task in a mildly distracting environment for ~10 mins given modA SLP Short Term Goal 3 (Week 2): Pt will solve functional problems w/ modA SLP Short Term Goal 4 (Week 2): Pt will improve L visual attention to modA  Skilled Therapeutic Interventions:   Pt and his significant other greeted at bedside. He was visibly upset upon SLP and arrival and responded not good when asked how he was. SLP attempted to provide emotional support to the pt, however, he was upset re a personal matter between him and his significant other. Despite this, he was agreeable to tx tasks targeting cognition. He completed a written task targeting L visual attention, organization, and sustained attention. He benefited from maxA, including verbal encouragement. Increased impulsivity and reduced organization noted as compared to prev tx sessions; anticipate internal distractions and frustrations negatively impacted overall success this date. SLP provided additional education re current cognitive deficits and upcoming team conference. Additional questions were answered. At the end of tx tasks, he was left w/ the alarm set and call light within reach. Pt's significant other remained present as well. Recommend cont ST per POC.   Pain Pain Assessment Pain Scale: 0-10 Pain Score: 0-No pain  Therapy/Group: Individual Therapy  [name] 08/14/2024, 9:03 AM

## 2024-08-14 NOTE — Progress Notes (Signed)
 Occupational Therapy Session Note  Patient Details  Name: Joshua Harmon MRN: [id number] Date of Birth: Nov 24, 1960  Today's Date: 08/14/2024 OT Individual Time: 1330-1415 OT Individual Time Calculation (min): 45 min    Short Term Goals: Week 2:  OT Short Term Goal 1 (Week 2): Pt will complete LB dressing with min A OT Short Term Goal 2 (Week 2): Pt will complete stand pivot with min A OT Short Term Goal 3 (Week 2): Pt will complete sit > stand with min A OT Short Term Goal 4 (Week 2): Pt will right head/trunk to midline with no more than mod cueing  Skilled Therapeutic Interventions/Progress Updates:    Pt received sitting in [facility] presenting to be down about current condition and family drama currently happening receptive to skilled OT session reporting 0/10 pain- OT offering intermittent rest breaks, repositioning, and therapeutic support to optimize participation in therapy session. Ambulated about 120 ft utilizing RW with MOD A for maintaining hand placement on RW and trunk control, MOD cueing for step through, LLE placement, and RW management. Initially started activity with clothespins to clasp on basketball net, however becoming very frustrated, modified activity to beanbags. Focused this session on NMR of LUE by encouraging functional grasp and reach to place bean bags x8 into basketball goal while seated EOM with wedge to promote anterior pelvic tilt. Required MIN A for LUE facilitation and frequent cueing for L attention. Although improved, Pt overshoots/undershoots 75% of time. Trialed hemi-walker d/t heavy MOD A for LUE management on RW, however d/t severe motor planning deficits requiring MAX A switched back to RW. Ambulated approximately 120 ft utilizing RW with MOD A for maintaining hand placement and trunk control, requiring MOD verbal cues for step through and RW management. Pt was left resting in WC with call bell in reach, seatbelt alarm on, and all needs met.    Therapy  Documentation Precautions:  Precautions Precautions: Fall Recall of Precautions/Restrictions: Impaired Precaution/Restrictions Comments: Poor awareness, L lean, ataxic Restrictions Weight Bearing Restrictions Per Provider Order: No  Therapy/Group: Individual Therapy  Paulina Fleeta Dixie 08/14/2024, 3:22 PM

## 2024-08-14 NOTE — Progress Notes (Signed)
 Physical Therapy Session Note  Patient Details  Name: Joshua Harmon MRN: [id number] Date of Birth: 1961-10-20  Today's Date: 08/14/2024 PT Individual Time: 0903-0958 PT Individual Time Calculation (min): 55 min   Short Term Goals: Week 2:  PT Short Term Goal 1 (Week 2): Pt will complete bed mobility with min A PT Short Term Goal 2 (Week 2): Pt will perform transfers with min A PT Short Term Goal 3 (Week 2): Pt will perform gait x 25' with mod+2  Skilled Therapeutic Interventions/Progress Updates:     Pt received supine in bed and agrees to therapy. No complaint of pain. PT and pt's girlfriend assist to dress pt prior to mobility, with PT providing cues for sequencing and attention to Lt side. Pt performs supine to sit with modA and cues for body mechanics and positioning. Pt performs sit to stand with modA +2 and cues for sequencing and initiation. Pt able to pull pants up in standing with minA/modA +1, then performs stand step transfer to [facility] with modA and cues for hand placement and sequencing. WC transport to gym. Pt performs stand step to mat table with modA +1 and cues for posture and positioning. Mirror provided for visual feedback. PT places tape vertically on mirror to provide pt with visual representation of midline and provides pt with frequent cues to utilize mirror to adjust posture. Pt then completes squigz retrieval activity, tasked with standing and reaching for squigz with LUE and then placing in bucket to challenge balance, coordination, and Lt sided motor planning. Pt performs first bout with modA +2 and cues for hip and trunk extension, as well  returning to midline between each repetition. Pt takes seated rest break. Pt then completes bout with +1 assist, performing sit to stand with minA and then squigz retrieval with modA +1. Following, pt tasked with cleaning squigz with gloves on and using sani wipes to work on [employer] coordination and attention, with PT providing consistent  cues for Lt attention. Pt performs stand step back to WC with modA. Left seated with all needs within reach    Therapy Documentation Precautions:  Precautions Precautions: Fall Recall of Precautions/Restrictions: Impaired Precaution/Restrictions Comments: Poor awareness, L lean, ataxic Restrictions Weight Bearing Restrictions Per Provider Order: No   Therapy/Group: Individual Therapy  [name], PT, DPT 08/14/2024, 4:03 PM

## 2024-08-14 NOTE — Progress Notes (Signed)
 PROGRESS NOTE   Subjective/Complaints:  No events overnight.  No acute complaints.  Doing well today, sleeping well, eating well.  Asking about reason for lab work this a.m., but agreeable once discussed.  Wife at bedside, no questions today. Labs yesterday with downtrending sodium, 133. Discussed with family yesterday discharged to Dr. Buckley appointment 9-30  ROS: Patient denies chills, rash, sore throat, blurred vision, dizziness, nausea, vomiting, diarrhea, cough, shortness of breath or chest pain, joint or back/neck pain,   or mood change.  Denies new motor or sensory changes.  Objective:   No results found.  Recent Labs    08/13/24 1010  WBC 16.9*  HGB 15.9  HCT 47.6  PLT 344    Recent Labs    08/13/24 1010  NA 133*  K 4.3  CL 99  CO2 20*  GLUCOSE 98  BUN 18  CREATININE 0.88  CALCIUM 8.9      Intake/Output Summary (Last 24 hours) at 08/14/2024 0941 Last data filed at 08/14/2024 0750 Gross per 24 hour  Intake 1052 ml  Output 575 ml  Net 477 ml        Physical Exam: Vital Signs Blood pressure 111/78, pulse 63, temperature 98.4 F (36.9 C), temperature source Oral, resp. rate 17, height 6' 1 (1.854 m), weight 69.5 kg, SpO2 99%.  Constitutional: No distress .  Awake, alert.  Sitting up in wheelchair. HEENT: right crani site CDI.  EOMI, oral membranes moist Neck: supple Cardiovascular: RRR without murmur. No JVD    Respiratory/Chest: Clear to auscultation bilaterally, nonlabored breathing GI/Abdomen: BS +, non-tender, non-distended, soft Ext: no clubbing, cyanosis, or edema Psych: pleasant and cooperative  Skin: C/D/I. No apparent lesions. .  Well-approximated.  Right crani site well-approximated, healing.  Neurologic exam:    Pt alert and oriented x 3. Right gaze preference./Left hemineglect--ongoing Some moderate difficulty with attention and insight--ongoing   MMT: RUE and RLE grossly 5/5  prox to distal.  LUE 5-/5 LUE and LLE with exception of 2-3/5 ADF/PF.  --Complicated by hemineglect Sensation appears grossly intact to LT and pain in all 4's.  DTR's 1+.  No apparent ataxia Tone: MAS 1 left elbow flexor, shoulder adductor  SK: Ongoing right head tilt, can bring to midline with stimulation, no palpable tight muscles or spasms.    Assessment/Plan: 1. Functional deficits which require 3+ hours per day of interdisciplinary therapy in a comprehensive inpatient rehab setting. Physiatrist is providing close team supervision and 24 hour management of active medical problems listed below. Physiatrist and rehab team continue to assess barriers to discharge/monitor patient progress toward functional and medical goals  Care Tool:  Bathing    Body parts bathed by patient: Left arm, Chest, Abdomen, Front perineal area, Buttocks, Right upper leg, Left upper leg, Right lower leg, Left lower leg, Face, Right arm   Body parts bathed by helper: Right lower leg, Left lower leg Body parts n/a: Right arm   Bathing assist Assist Level: Moderate Assistance - Patient 50 - 74%     Upper Body Dressing/Undressing Upper body dressing   What is the patient wearing?: Pull over shirt    Upper body assist Assist Level: Moderate Assistance -  Patient 50 - 74%    Lower Body Dressing/Undressing Lower body dressing      What is the patient wearing?: Underwear/pull up, Pants     Lower body assist Assist for lower body dressing: Moderate Assistance - Patient 50 - 74%     Toileting Toileting    Toileting assist Assist for toileting: Moderate Assistance - Patient 50 - 74%     Transfers Chair/bed transfer  Transfers assist     Chair/bed transfer assist level: Minimal Assistance - Patient > 75%     Locomotion Ambulation   Ambulation assist   Ambulation activity did not occur: Safety/medical concerns (2/2 strong posture deficit and fatigue)  Assist level: Moderate Assistance -  Patient 50 - 74% Assistive device: Walker-rolling Max distance: 180'   Walk 10 feet activity   Assist  Walk 10 feet activity did not occur: Safety/medical concerns (2/2 strong posture deficit and fatigue)  Assist level: Moderate Assistance - Patient - 50 - 74% Assistive device: Walker-rolling   Walk 50 feet activity   Assist Walk 50 feet with 2 turns activity did not occur: Safety/medical concerns (2/2 strong posture deficit and fatigue)  Assist level: Moderate Assistance - Patient - 50 - 74% Assistive device: Walker-rolling    Walk 150 feet activity   Assist Walk 150 feet activity did not occur: Safety/medical concerns (2/2 strong posture deficit and fatigue)  Assist level: Moderate Assistance - Patient - 50 - 74% Assistive device: Walker-rolling    Walk 10 feet on uneven surface  activity   Assist Walk 10 feet on uneven surfaces activity did not occur: Safety/medical concerns (2/2 strong posture deficit and fatigue)         Wheelchair     Assist Is the patient using a wheelchair?: Yes Type of Wheelchair: Manual    Wheelchair assist level: Dependent - Patient 0% Max wheelchair distance: 322ft    Wheelchair 50 feet with 2 turns activity    Assist        Assist Level: Dependent - Patient 0%   Wheelchair 150 feet activity     Assist      Assist Level: Dependent - Patient 0%   Blood pressure 111/78, pulse 63, temperature 98.4 F (36.9 C), temperature source Oral, resp. rate 17, height 6' 1 (1.854 m), weight 69.5 kg, SpO2 99%.  Medical Problem List and Plan: 1. 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 Janjua 9/8             -patient may  shower             -ELOS/Goals: 12-14 days, supervision to min assist with self-care and mobility, mod I to supervision with cognition --  DC 9/30 to appointment  -Continue CIR therapies including PT, OT, and SLP     - 9/16: On/off confusion for night  shift; telesitter started yesterday. Poor proprioception in LUE and LLE; difficulty with L sided attention. Max A LBD, Mod-Max A transfers, making very good progress. Mod A for bed mobility and transfers, walked 50 ft with Min A x2. L ataxia. Perseverative. Mod-severe issues with memory and problem solving. L oral pocketing.    - Will need note for driving restrictions  -0-81: Dr. Eward NP will follow-up with patient , they will be meeting with him at 1 PM this coming Tuesday.  Unsure of plan at this time.  May need to facilitate early discharge if chemo/radiation therapy required urgently--discussed with family 9/19--appointment changed to  9-30 at 2 PM, with plans to discharge to appointment per Dr. Buckley  -- Teams tomorrow a.m., will need to discuss adjustment of goals to accommodate timeline for discharge   - 9/23: Motor planning and proprioceptive deficits - gets into unsafe positions with transfers and turns. SLP L inattention limiting cog - goals Min-Mod A.   2.  Antithrombotics: -DVT/anticoagulation:  Mechanical:  Antiembolism stockings, knee (TED hose) Bilateral lower extremities Sequential compression devices, below knee Bilateral lower extremities             -antiplatelet therapy: N/A  3. Pain Management: Tylenol , Fioricet as needed  - no pain endorsed  4. Mood/Behavior/Sleep: LCSW to follow for evaluation and support when available.              -antipsychotic agents: N/A   - 9/16: Started on telesitter overnight for impulsivity; will start sleep log + trazodone  50 mg at bedtime PRN--sleep log appropriate  5. Neuropsych/cognition: This patient is not capable of making decisions on his own behalf.   - 9/16: start ritalin  5 mg BID for attention and lethargy--continue trial, arousal much improved but concentration remains difficult  9-19: Per patient's fianc, seen considerable improvements.  Ongoing severe deficits per therapy note; concurrent continue current regimen  9-22:  Remains very poor insight, no appreciable improvements.  Will DC Ritalin  for trial.  6. Skin/Wound Care: Routine pressure-relief measures             -Per neurosurgery staple removal planned for this week (POD #10 9/18)--confirmed with Dr. Rosslyn, order placed to remove staples 9-19  7. Fluids/Electrolytes/Nutrition: Monitor intake and output and routine follow-up labs in a.m.              - on regular/ thins diet             - GERD: continue PPI  8.  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 Janjua on 9/12 decrease to 2 mg q8h indefinitely until radiation treatment begins - 9-19: IDH testing unable to be performed due to insufficient sample; per Dr. Buckley, will not alter or delay treatment, will send out  9.  Impaired glucose tolerance: Secondary to steroids CBGs have been below 180 has not required coverage.              - CBG monitoring was every 4 hours now 2x daily --has been stable, continue to hold SSI   - 9/15: CBG remain between 100 and 150; can stop CBGs    10.  Constipation: pt had bm yesterday -senokot-s 1 tab at bedtime -miralax  prn             Last bowel movement 9-17, small x2  Lbm today 9/23, small  11.  Leukocytosis.  likely secondary to Decadron . check urine and chest x-ray today to screen for secondary causes.  Low threshold for blood cultures and broad-spectrum antibiotics if he develops vital instability.  - 9-14: Urinalysis clear, chest x-ray with no acute process.  Vitally stable, afebrile.  Trend  9/15 sl increase to 15k today. Pt afebrile, no s/s infection. UA/CXR as above  9-22: Remains slightly elevated on Decadron  2 mg every 8; afebrile, no signs of infection, monitor.  Will need to clarify if any further taper closer to [facility] continue current regiment for now given good tolerance and no current treatment.  12.  Right neck tone/spasticity.   Start baclofen   5 mg 3 times daily.  Not currently bothersome, but want to prevent creasing and skin breakdown if possible.  - 9-14: Head positioning much better today, likely more related to hemineglect.  Move baclofen  to 3 times daily as needed.  9/15 pt fairly neutral today--left inattention is biggest factor  -18: Range of motion, inattention improving.  9-23: Starting to develop some spasticity in the left elbow.  Not using PRNs, not bothersome at this time.  Can range to neutral easily.  Continue to monitor  13.  Hyponatremia.  Mild, 133, but downtrending.  Will get blood and urine studies today to evaluate for SIADH; may also be related to ongoing glucocorticoids.  Placed fluid restriction.  - Labs pending  LOS: 11 days A FACE TO FACE EVALUATION WAS PERFORMED  Joshua Harmon Likes 08/14/2024, 9:41 AM

## 2024-08-14 NOTE — Patient Care Conference (Signed)
 Inpatient RehabilitationTeam Conference and Plan of Care Update Date: 08/14/2024   Time: 1016 am    Patient Name: Joshua Harmon      Medical Record Number: [id number]  Date of Birth: 1961-05-02 Sex: Male         Room/Bed: 4W18C/4W18C-01 Payor Info: Payor: BLUE [employer] BLUE [employer]IELD / Plan: BCBS COMM PPO / Product Type: *No Product type* /    Admit Date/Time:  08/03/2024  5:01 PM  Primary Diagnosis:  Brain tumor [facility])  Hospital Problems: Principal Problem:   Brain tumor [facility]) Active Problems:   Brain mass   Nicotine  use   Leg weakness, bilateral    Expected Discharge Date: Expected Discharge Date: 08/21/24  Team Members Present: Physician leading conference: Dr. Joesph Likes Social Worker Present: [name] Jude, LCSW Nurse Present: Eulalio Falls, RN PT Present: Kirt Dawn, PT OT Present: Nena Moats, OT SLP Present: Recardo Mole, SLP PPS Coordinator present : Eleanor Colon, SLP     Current Status/Progress Goal Weekly Team Focus  Bowel/Bladder   Patient is continent of bowel and bladder   Remain continent of bowel and bladder   Assist patient with toileting as needed    Swallow/Nutrition/ Hydration   regular/thin           ADL's   Mod A dressing 2/2 apraxia, UB bathing at (S) level, LB with min A. Can stand with min A with UE support. Improved midline orientation overall. L inattention and motor planning deficits impact transfers but has progressed to mod A, and is approaching min A   CGA to supervision   ADLs, transfers, NMR, trunk control, L attention    Mobility   modA bed mobility, minA/modA sit to stand transfers, modA x180' with RW   CGA  Lt Hemibody NMR, motor planning, bed mobility, transfers, balance, ambulation    Communication                Safety/Cognition/ Behavioral Observations  moderate to severe deficits remain; slightly improved memory though problem solving, awareness, and L inattention remain   min-modA   pt/family education,  cognitive retraining    Pain   No complaints of pain.   Patient will remain free of pain   Assess pain score and provide intervention as needed    Skin   Surgical incision to head with staples removed. Remains clean and dry   Skin will remain free from infection  Assess skin every shift and monitor for breakdown. Keep head incision clean.      Discharge Planning:  Pt will d/c to home with s/o as primary caregiver. SW will confirm there are no barriers to discharge.    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 Patient progress limited by left hemi neglect , poor attention, poor cognition and poor insight.   Patient on target to meet rehab goals: Currently patient needs supervision- min assist with ADLs and transfers. Patient needs mod assist with sit to stand. Patient was able to ambulate up to 180' with mod assistance using a rolling walker. Patient needs supervision assistance with cognition although improved memory, and awareness. Overall goals are set for CGA.   *See Care Plan and progress notes for long and short-term goals.   Revisions to Treatment Plan:  Downgraded goals Telemonitor   Teaching Needs: Safety, medications, transfers, toileting, etc   Current Barriers to Discharge: Decreased caregiver support and Home enviroment access/layout  Possible Resolutions to Barriers: Family Education Home health follow up  Medical Summary Current Status: Medically complicated by grade 4 glioma pending surgical/radiation plan, left hemineglect, poor attention/cognition, hyponatremia and hyperglycemia  Barriers to Discharge: Behavior/Mood;Complicated Wound;Electrolyte abnormality;Medical stability   Possible Resolutions to [employer]: monitor BG while on decadron  taper, telemonitorring for poor insight, lab workup for hyponatremia causes, coordination with neuro-oncology for initiation of treatment   Continued Need  for Acute Rehabilitation Level of Care: The patient requires daily medical management by a physician with specialized training in physical medicine and rehabilitation for the following reasons: Direction of a multidisciplinary physical rehabilitation program to maximize functional independence : Yes Medical management of patient stability for increased activity during participation in an intensive rehabilitation regime.: Yes Analysis of laboratory values and/or radiology reports with any subsequent need for medication adjustment and/or medical intervention. : Yes   I attest that I was present, lead the team conference, and concur with the assessment and plan of the team.   Eyan Hagood Gayo 08/14/2024, 1016 am

## 2024-08-14 NOTE — Progress Notes (Signed)
 Occupational Therapy Session Note  Patient Details  Name: Joshua Harmon MRN: [id number] Date of Birth: Aug 10, 1961  Today's Date: 08/14/2024 OT Individual Time: 8964-8882 OT Individual Time Calculation (min): 42 min    Short Term Goals: Week 2:  OT Short Term Goal 1 (Week 2): Pt will complete LB dressing with min A OT Short Term Goal 2 (Week 2): Pt will complete stand pivot with min A OT Short Term Goal 3 (Week 2): Pt will complete sit > stand with min A OT Short Term Goal 4 (Week 2): Pt will right head/trunk to midline with no more than mod cueing  Skilled Therapeutic Interventions/Progress Updates:    Pt received sitting in the w/c with no c/o pain. Extensive discussion with him and [name] re change in discharge plans, upcoming cx tx (within OT scope) and family edu/OT POC. He was solemn following this discussion, provided therapeutic support and listening. Pt completed sit > stand from the TIS w/c with min A to power up but skilled cueing and mod A to right trunk to ensure safety. He completed 100 ft of functional mobility with the RW, requiring mod cueing for LLE stride length and step through, as well as for trunk righting to midline. Mod A required to keep LUE on the RW and manage trunk. In standing he attempted dynamic LLE kicking activity with a soccer ball, requiring BUE support d/t fear of falling and to manage trunk. He required mod A overall and had difficulty with large enough lateral weight shift. He then transferred to the mat and worked on attempting to get into quadoped. He required heavy max A to assume the position after multiple attempts and was only able to sustain position for 10 seconds d/t pain and worsening LUE/LE tone. Graded activity down to working in prone, completing prone pushups with mod A required at the L chest and UE, to come into modified push ups- to address trunk extension and LUE coordination/NMR. He returned to his room following with the RW, similar  assist as before. Pt was left sitting up in the wheelchair with all needs met, chair alarm set, and call bell within reach.    Therapy Documentation Precautions:  Precautions Precautions: Fall Recall of Precautions/Restrictions: Impaired Precaution/Restrictions Comments: Poor awareness, L lean, ataxic Restrictions Weight Bearing Restrictions Per Provider Order: No   Therapy/Group: Individual Therapy  Nena VEAR Moats 08/14/2024, 11:44 AM

## 2024-08-14 NOTE — Plan of Care (Signed)
  Problem: Consults Goal: RH GENERAL PATIENT EDUCATION Description: See Patient Education module for education specifics. Outcome: Progressing   Problem: RH BOWEL ELIMINATION Goal: RH STG MANAGE BOWEL WITH ASSISTANCE Description: STG Manage Bowel with supervision-min  Assistance. Outcome: Progressing   Problem: RH BLADDER ELIMINATION Goal: RH STG MANAGE BLADDER WITH ASSISTANCE Description: STG Manage Bladder With supervision-min Assistance Outcome: Progressing   Problem: RH SKIN INTEGRITY Goal: RH STG SKIN FREE OF INFECTION/BREAKDOWN Description: Manage skin free of infection with supervision- min assistance  Outcome: Progressing   Problem: RH SAFETY Goal: RH STG ADHERE TO SAFETY PRECAUTIONS W/ASSISTANCE/DEVICE Description: STG Adhere to Safety Precautions With supervision-min Assistance/Device. Outcome: Progressing   Problem: RH PAIN MANAGEMENT Goal: RH STG PAIN MANAGED AT OR BELOW PT'S PAIN GOAL Description: < 4 w/ prns Outcome: Progressing   Problem: RH KNOWLEDGE DEFICIT GENERAL Goal: RH STG INCREASE KNOWLEDGE OF SELF CARE AFTER HOSPITALIZATION Description: Manage increase knowledge of self care after hospitalization with supervision- min assistance from partner using educational materials provided Outcome: Progressing   Problem: RH COGNITION-NURSING Goal: RH STG USES MEMORY AIDS/STRATEGIES W/ASSIST TO PROBLEM SOLVE Description: STG Uses Memory Aids/Strategies With min- supervision Assistance to Problem Solve. Outcome: Progressing   Problem: RH KNOWLEDGE DEFICIT BRAIN INJURY Goal: RH STG INCREASE KNOWLEDGE OF SELF CARE AFTER BRAIN INJURY Description: Increase knowledge of self care after brain injury with min-supervision assistance Outcome: Progressing

## 2024-08-14 NOTE — Progress Notes (Signed)
 Patient ID: Joshua Harmon, male   DOB: [birth year], 63 y.o.   MRN: [id number]  1256- SW called pt s/o other to provide updates from team conference, and d/c date is now 9/30 due to upcoming oncology appointment.   [name] Jude, MSW, LCSW Office: [phone] Cell: [phone] Fax: [phone]

## 2024-08-15 DIAGNOSIS — D496 Neoplasm of unspecified behavior of brain: Secondary | ICD-10-CM | POA: Diagnosis not present

## 2024-08-15 MED ORDER — METHYLPHENIDATE HCL 5 MG PO TABS
5.0000 mg | ORAL_TABLET | Freq: Two times a day (BID) | ORAL | Status: DC
  Administered 2024-08-15 – 2024-08-21 (×13): 5 mg via ORAL
  Filled 2024-08-15 (×13): qty 1

## 2024-08-15 NOTE — Progress Notes (Signed)
 Physical Therapy Session Note  Patient Details  Name: Joshua Harmon MRN: [id number] Date of Birth: 25-Dec-1960  Today's Date: 08/15/2024 PT Individual Time: 8697-8585 PT Individual Time Calculation (min): 72 min   Short Term Goals: Week 2:  PT Short Term Goal 1 (Week 2): Pt will complete bed mobility with min A PT Short Term Goal 2 (Week 2): Pt will perform transfers with min A PT Short Term Goal 3 (Week 2): Pt will perform gait x 25' with mod+2  Skilled Therapeutic Interventions/Progress Updates:     Pt received seated in [facility] and agrees to therapy. No complaint of pain. WC transport to gym for time management. Pt performs sit to stand with minA and RW, with cues for hand placement and body mechanics. Pt ambulates x90' with RW and modA overall, primarily to assist with AD management for safety. Pt tends to push RW far out ahead of body and has difficulty maintaining safe proximity, especially during turns. PT provides frequent cues to attend to Lt hand and increase Lt step height. Following extended seated rest break, pt tasked with ambulating through cones to challenge pt's ability to turn during ambulation and perform dynamic weight shifting and sequencing. Pt requires modA to maxA, with increased difficulty managing RW and having Lt hand slip off handle frequently, requiring more frequent cueing to attend to Lt hand for safety. PT introduces eva walker to pt to provide increased upper extremity support and decrease coordination requiring for LUE. Pt then ambulates x190' with eva walker with modA overall, with improved proximity to AD throughout, but stillr ewquiring frequent cues for safety and fot attention to LUE and LLE. Pt transitions to Nustep with increased cues required for sequencing and positioning. Pt completes Nustep for reciprocal coordination training, with use of LLE stabilizer to prevent internal rotation and adduction of Lt hip. Pt completes x12:00 at workload of 5 with average  steps per minute ~45. PT provides cues to limit overuse of Rt hemibody to promote increased Lt hemibody NMR, as well as cues for completing full available ROM. Pt performs stand step back to WC with modA and no AD. Left seated with all needs within reach.   Therapy Documentation Precautions:  Precautions Precautions: Fall Recall of Precautions/Restrictions: Impaired Precaution/Restrictions Comments: Poor awareness, L lean, ataxic Restrictions Weight Bearing Restrictions Per Provider Order: No   Therapy/Group: Individual Therapy  [name], PT, DPT 08/15/2024, 4:41 PM

## 2024-08-15 NOTE — Plan of Care (Signed)
  Problem: RH Problem Solving Goal: LTG Patient will demonstrate problem solving for (SLP) Description: LTG:  Patient will demonstrate problem solving for basic/complex daily situations with cues  (SLP) Flowsheets (Taken 08/15/2024 1618) LTG: Patient will demonstrate problem solving for (SLP):  Basic daily situations  Complex daily situations LTG Patient will demonstrate problem solving for: Moderate Assistance - Patient 50 - 74% Note: Mildly complex   Problem: RH Memory Goal: LTG Patient will demonstrate ability for day to day (SLP) Description: LTG:   Patient will demonstrate ability for day to day recall/carryover during cognitive/linguistic activities with assist  (SLP) Flowsheets (Taken 08/15/2024 1618) LTG: Patient will demonstrate ability for day to day recall/carryover during cognitive/linguistic activities with assist (SLP): Moderate Assistance - Patient 50 - 74%   Problem: RH Attention Goal: LTG Patient will demonstrate this level of attention during functional activites (SLP) Description: LTG:  Patient will will demonstrate this level of attention during functional activites (SLP) Flowsheets (Taken 08/15/2024 1618) LTG: Patient will demonstrate this level of attention during cognitive/linguistic activities with assistance of (SLP): Minimal Assistance - Patient > 75% Number of minutes patient will demonstrate attention during cognitive/linguistic activities: 15   Problem: RH Awareness Goal: LTG: Patient will demonstrate awareness during functional activites type of (SLP) Description: LTG: Patient will demonstrate awareness during functional activites type of (SLP) Flowsheets (Taken 08/15/2024 1618) Patient will demonstrate during cognitive/linguistic activities awareness type of: Intellectual LTG: Patient will demonstrate awareness during cognitive/linguistic activities with assistance of (SLP): Maximal Assistance - Patient 25 - 49%   Goals downgraded d/t updated d/c date and  progress thus far

## 2024-08-15 NOTE — Plan of Care (Signed)
 Goals downgraded d/t earlier than anticipated d/c to begin oncology tx.   Problem: RH Balance Goal: LTG Patient will maintain dynamic standing with ADLs (OT) Description: LTG:  Patient will maintain dynamic standing balance with assist during activities of daily living (OT)  Flowsheets (Taken 08/15/2024 0915) LTG: Pt will maintain dynamic standing balance during ADLs with: (downgraded 9/24 d/t earlier than expected d/c- SD) Minimal Assistance - Patient > 75%   Problem: Sit to Stand Goal: LTG:  Patient will perform sit to stand in prep for activites of daily living with assistance level (OT) Description: LTG:  Patient will perform sit to stand in prep for activites of daily living with assistance level (OT) Flowsheets (Taken 08/15/2024 0915) LTG: PT will perform sit to stand in prep for activites of daily living with assistance level: (downgraded 9/24 d/t earlier than expected d/c- SD) Minimal Assistance - Patient > 75%   Problem: RH Bathing Goal: LTG Patient will bathe all body parts with assist levels (OT) Description: LTG: Patient will bathe all body parts with assist levels (OT) Flowsheets (Taken 08/15/2024 0915) LTG: Pt will perform bathing with assistance level/cueing: (downgraded 9/24 d/t earlier than expected d/c- SD) Minimal Assistance - Patient > 75%   Problem: RH Dressing Goal: LTG Patient will perform upper body dressing (OT) Description: LTG Patient will perform upper body dressing with assist, with/without cues (OT). Flowsheets (Taken 08/15/2024 0915) LTG: Pt will perform upper body dressing with assistance level of: (downgraded 9/24 d/t earlier than expected d/c- SD) Minimal Assistance - Patient > 75% Goal: LTG Patient will perform lower body dressing w/assist (OT) Description: LTG: Patient will perform lower body dressing with assist, with/without cues in positioning using equipment (OT) Flowsheets (Taken 08/15/2024 0915) LTG: Pt will perform lower body dressing with assistance  level of: (downgraded 9/24 d/t earlier than expected d/c- SD) Minimal Assistance - Patient > 75%   Problem: RH Toileting Goal: LTG Patient will perform toileting task (3/3 steps) with assistance level (OT) Description: LTG: Patient will perform toileting task (3/3 steps) with assistance level (OT)  Flowsheets (Taken 08/15/2024 0915) LTG: Pt will perform toileting task (3/3 steps) with assistance level: (downgraded 9/24 d/t earlier than expected d/c- SD) Minimal Assistance - Patient > 75%   Problem: RH Functional Use of Upper Extremity Goal: LTG Patient will use RT/LT upper extremity as a (OT) Description: LTG: Patient will use right/left upper extremity as a stabilizer/gross assist/diminished/nondominant/dominant level with assist, with/without cues during functional activity (OT) Flowsheets (Taken 08/15/2024 0915) LTG: Pt will use upper extremity in functional activity with assistance level of: (downgraded 9/24 d/t earlier than expected d/c- SD) Minimal Assistance - Patient > 75%   Problem: RH Tub/Shower Transfers Goal: LTG Patient will perform tub/shower transfers w/assist (OT) Description: LTG: Patient will perform tub/shower transfers with assist, with/without cues using equipment (OT) Outcome: Not Applicable

## 2024-08-15 NOTE — Progress Notes (Signed)
 PROGRESS NOTE   Subjective/Complaints:  No events overnight.  No acute complaints. Per therapies, he is having some increased ataxia in his left upper and right lower extremity today; complicated by worsening attention on the left side. Patient feels well overall.  ROS: Patient denies chills, rash, sore throat, blurred vision, dizziness, nausea, vomiting, diarrhea, cough, shortness of breath or chest pain, joint or back/neck pain,   or mood change.  Denies new motor or sensory changes.  Objective:   No results found.  Recent Labs    08/13/24 1010  WBC 16.9*  HGB 15.9  HCT 47.6  PLT 344    Recent Labs    08/13/24 1010  NA 133*  K 4.3  CL 99  CO2 20*  GLUCOSE 98  BUN 18  CREATININE 0.88  CALCIUM 8.9      Intake/Output Summary (Last 24 hours) at 08/15/2024 0818 Last data filed at 08/15/2024 0600 Gross per 24 hour  Intake 840 ml  Output 740 ml  Net 100 ml        Physical Exam: Vital Signs Blood pressure 107/74, pulse 76, temperature (!) 97.3 F (36.3 C), resp. rate 16, height 6' 1 (1.854 m), weight 69.5 kg, SpO2 97%.  Constitutional: No distress .  Awake, alert.  Sitting in wheelchair, working with therapies HEENT: right crani site CDI.  EOMI, oral membranes moist Neck: supple Cardiovascular: RRR without murmur. No JVD    Respiratory/Chest: Clear to auscultation bilaterally, nonlabored breathing GI/Abdomen: BS +, non-tender, non-distended, soft Ext: no clubbing, cyanosis, or edema Psych: pleasant and cooperative  Skin: C/D/I. No apparent lesions. .  Well-approximated.  Right crani site well-approximated, healing.  Neurologic exam:    Pt alert and oriented x 3. Right gaze preference./Left hemineglect--ongoing Some moderate difficulty with attention and insight--increased today   MMT: RUE and RLE grossly 5/5 prox to distal--unchanged LUE 5-/5 LUE and LLE with exception of 2-3/5 ADF/PF.   --Complicated by hemineglect, which is slightly worse today  Sensation appears grossly intact to LT and pain in all 4's.  DTR's 1+.  No apparent ataxia Tone: MAS 1 left elbow flexor, shoulder adductor--unchanged  SK: Ongoing right head tilt, can bring to midline with stimulation, no palpable tight muscles or spasms.    Assessment/Plan: 1. Functional deficits which require 3+ hours per day of interdisciplinary therapy in a comprehensive inpatient rehab setting. Physiatrist is providing close team supervision and 24 hour management of active medical problems listed below. Physiatrist and rehab team continue to assess barriers to discharge/monitor patient progress toward functional and medical goals  Care Tool:  Bathing    Body parts bathed by patient: Left arm, Chest, Abdomen, Front perineal area, Buttocks, Right upper leg, Left upper leg, Right lower leg, Left lower leg, Face, Right arm   Body parts bathed by helper: Right lower leg, Left lower leg Body parts n/a: Right arm   Bathing assist Assist Level: Moderate Assistance - Patient 50 - 74%     Upper Body Dressing/Undressing Upper body dressing   What is the patient wearing?: Pull over shirt    Upper body assist Assist Level: Moderate Assistance - Patient 50 - 74%    Lower  Body Dressing/Undressing Lower body dressing      What is the patient wearing?: Underwear/pull up, Pants     Lower body assist Assist for lower body dressing: Moderate Assistance - Patient 50 - 74%     Toileting Toileting    Toileting assist Assist for toileting: Moderate Assistance - Patient 50 - 74%     Transfers Chair/bed transfer  Transfers assist     Chair/bed transfer assist level: Minimal Assistance - Patient > 75%     Locomotion Ambulation   Ambulation assist   Ambulation activity did not occur: Safety/medical concerns (2/2 strong posture deficit and fatigue)  Assist level: Moderate Assistance - Patient 50 - 74% Assistive  device: Walker-rolling Max distance: 180'   Walk 10 feet activity   Assist  Walk 10 feet activity did not occur: Safety/medical concerns (2/2 strong posture deficit and fatigue)  Assist level: Moderate Assistance - Patient - 50 - 74% Assistive device: Walker-rolling   Walk 50 feet activity   Assist Walk 50 feet with 2 turns activity did not occur: Safety/medical concerns (2/2 strong posture deficit and fatigue)  Assist level: Moderate Assistance - Patient - 50 - 74% Assistive device: Walker-rolling    Walk 150 feet activity   Assist Walk 150 feet activity did not occur: Safety/medical concerns (2/2 strong posture deficit and fatigue)  Assist level: Moderate Assistance - Patient - 50 - 74% Assistive device: Walker-rolling    Walk 10 feet on uneven surface  activity   Assist Walk 10 feet on uneven surfaces activity did not occur: Safety/medical concerns (2/2 strong posture deficit and fatigue)         Wheelchair     Assist Is the patient using a wheelchair?: Yes Type of Wheelchair: Manual    Wheelchair assist level: Dependent - Patient 0% Max wheelchair distance: 371ft    Wheelchair 50 feet with 2 turns activity    Assist        Assist Level: Dependent - Patient 0%   Wheelchair 150 feet activity     Assist      Assist Level: Dependent - Patient 0%   Blood pressure 107/74, pulse 76, temperature (!) 97.3 F (36.3 C), resp. rate 16, height 6' 1 (1.854 m), weight 69.5 kg, SpO2 97%.  Medical Problem List and Plan: 1. 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 Janjua 9/8             -patient may  shower             -ELOS/Goals: 12-14 days, supervision to min assist with self-care and mobility, mod I to supervision with cognition --  DC 9/30 to appointment  -Continue CIR therapies including PT, OT, and SLP     - 9/16: On/off confusion for night shift; telesitter started yesterday. Poor  proprioception in LUE and LLE; difficulty with L sided attention. Max A LBD, Mod-Max A transfers, making very good progress. Mod A for bed mobility and transfers, walked 50 ft with Min A x2. L ataxia. Perseverative. Mod-severe issues with memory and problem solving. L oral pocketing.    - Will need note for driving restrictions  -0-81: Dr. Eward NP will follow-up with patient , they will be meeting with him at 1 PM this coming Tuesday.  Unsure of plan at this time.  May need to facilitate early discharge if chemo/radiation therapy required urgently--discussed with family 9/19--appointment changed to 9-30 at 2 PM, with plans to discharge to appointment  per Dr. Buckley  -- Teams tomorrow a.m., will need to discuss adjustment of goals to accommodate timeline for discharge   - 9/23: Motor planning and proprioceptive deficits - gets into unsafe positions with transfers and turns. SLP L inattention limiting cog - goals Min-Mod A.   2.  Antithrombotics: -DVT/anticoagulation:  Mechanical:  Antiembolism stockings, knee (TED hose) Bilateral lower extremities Sequential compression devices, below knee Bilateral lower extremities             -antiplatelet therapy: N/A  3. Pain Management: Tylenol , Fioricet as needed  - no pain endorsed  4. Mood/Behavior/Sleep: LCSW to follow for evaluation and support when available.              -antipsychotic agents: N/A   - 9/16: Started on telesitter overnight for impulsivity; will start sleep log + trazodone  50 mg at bedtime PRN--sleep log appropriate  5. Neuropsych/cognition: This patient is not capable of making decisions on his own behalf.   - 9/16: start ritalin  5 mg BID for attention and lethargy--continue trial, arousal much improved but concentration remains difficult  9-19: Per patient's fianc, seen considerable improvements.  Ongoing severe deficits per therapy note; concurrent continue current regimen  9-22: Remains very poor insight, no appreciable  improvements.  Will DC Ritalin  for trial.  9-24: Resume Ritalin  due to apparent worse hemineglect with therapies today, worse attention  6. Skin/Wound Care: Routine pressure-relief measures             -Per neurosurgery staple removal planned for this week (POD #10 9/18)--confirmed with Dr. Rosslyn, order placed to remove staples 9-19  7. Fluids/Electrolytes/Nutrition: Monitor intake and output and routine follow-up labs in a.m.              - on regular/ thins diet             - GERD: continue PPI  8.  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 Janjua on 9/12 decrease to 2 mg q8h indefinitely until radiation treatment begins - 9-19: IDH testing unable to be performed due to insufficient sample; per Dr. Buckley, will not alter or delay treatment, will send out  9.  Impaired glucose tolerance: Secondary to steroids CBGs have been below 180 has not required coverage.              - CBG monitoring was every 4 hours now 2x daily --has been stable, continue to hold SSI   - 9/15: CBG remain between 100 and 150; can stop CBGs    10.  Constipation: pt had bm yesterday -senokot-s 1 tab at bedtime -miralax  prn             Last bowel movement 9-17, small x2  Lbm today 9/23, small  11.  Leukocytosis.  likely secondary to Decadron . check urine and chest x-ray today to screen for secondary causes.  Low threshold for blood cultures and broad-spectrum antibiotics if he develops vital instability.  - 9-14: Urinalysis clear, chest x-ray with no acute process.  Vitally stable, afebrile.  Trend  9/15 sl increase to 15k today. Pt afebrile, no s/s infection. UA/CXR as above  9-22: Remains slightly elevated on Decadron  2 mg every 8; afebrile, no signs of infection, monitor.  Will need to clarify if any further taper closer to [facility] continue current regiment for now given good tolerance and no current  treatment.  12.  Right neck  tone/spasticity.  Start baclofen  5 mg 3 times daily.  Not currently bothersome, but want to prevent creasing and skin breakdown if possible.  - 9-14: Head positioning much better today, likely more related to hemineglect.  Move baclofen  to 3 times daily as needed.  9/15 pt fairly neutral today--left inattention is biggest factor  -18: Range of motion, inattention improving.  9-23: Starting to develop some spasticity in the left elbow.  Not using PRNs, not bothersome at this time.  Can range to neutral easily.  Continue to monitor  13.  Hyponatremia.  Mild, 133, but downtrending.  Will get blood and urine studies today to evaluate for SIADH; may also be related to ongoing glucocorticoids.  Placed fluid restriction.  - 9/24: Patient refused labs yesterday; est serum osm 278. 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 Continue fluid restriction.  Discussed this with him today  LOS: 12 days A FACE TO FACE EVALUATION WAS PERFORMED  Joesph JAYSON Likes 08/15/2024, 8:18 AM

## 2024-08-15 NOTE — Progress Notes (Signed)
 Speech Language Pathology Daily Session Note  Patient Details  Name: Joshua Harmon MRN: [id number] Date of Birth: February 01, 1961  Today's Date: 08/15/2024 SLP Individual Time: 0900-1000 SLP Individual Time Calculation (min): 60 min  Short Term Goals: Week 2: SLP Short Term Goal 1 (Week 2): Pt will utilize compensatory memory strategies as needed to recall recent/relevant info w/ modA SLP Short Term Goal 2 (Week 2): Pt will sustain attention to task in a mildly distracting environment for ~10 mins given modA SLP Short Term Goal 3 (Week 2): Pt will solve functional problems w/ modA SLP Short Term Goal 4 (Week 2): Pt will improve L visual attention to modA  Skilled Therapeutic Interventions:   Pt and girlfriend greeted at bedside for tx tasks targeting cognition. He was independently oriented to time. SLP facilitated written time management task and he benefited from [facility] verbal cues for L visual attention, information processing, and working memory. He benefited from multiple repetitions with each trial. He then completed a task filling in missing letters (category provided). He benefited from [facility] for L visual attention and problem solving. He continues to require totalA for error awareness. LTGs downgraded given updated d/c date and progress thus far. Recommend cont ST per updated POC.   Pain Pain Assessment Pain Scale: 0-10 Pain Score: 0-No pain  Therapy/Group: Individual Therapy  Joshua Harmon 08/15/2024, 4:01 PM

## 2024-08-15 NOTE — Plan of Care (Signed)
  Problem: RH BOWEL ELIMINATION Goal: RH STG MANAGE BOWEL WITH ASSISTANCE Description: STG Manage Bowel with supervision-min  Assistance. Outcome: Progressing   Problem: RH BLADDER ELIMINATION Goal: RH STG MANAGE BLADDER WITH ASSISTANCE Description: STG Manage Bladder With supervision-min Assistance Outcome: Progressing   Problem: RH SKIN INTEGRITY Goal: RH STG SKIN FREE OF INFECTION/BREAKDOWN Description: Manage skin free of infection with supervision- min assistance  Outcome: Progressing

## 2024-08-15 NOTE — Progress Notes (Signed)
 Occupational Therapy Session Note  Patient Details  Name: Joshua Harmon MRN: [id number] Date of Birth: 12-03-1960  Today's Date: 08/15/2024 OT Individual Time: 9267-9141 OT Individual Time Calculation (min): 86 min    Short Term Goals: Week 2:  OT Short Term Goal 1 (Week 2): Pt will complete LB dressing with min A OT Short Term Goal 2 (Week 2): Pt will complete stand pivot with min A OT Short Term Goal 3 (Week 2): Pt will complete sit > stand with min A OT Short Term Goal 4 (Week 2): Pt will right head/trunk to midline with no more than mod cueing  Skilled Therapeutic Interventions/Progress Updates:    Pt received supine with no c/o pain, agreeable to OT session. His SO Joshua Harmon was present but sleeping throughout session until the end. Notified DO of worse attention and gross coordination today. He required max A to stand from EOB with heavy cueing- both LE shooting out into extension when he was trying to stand and pt in large posterior pelvic tilt. He finally came to standing and LUE continued to demonstrate poor awareness/proprioception and required max cueing for L head turn. He completed functional mobility with the RW into the bathroom with mod A for maintaining LUE grasp on the RW and for trunk control. Mod-max cueing required for L head turn with poor tolerance, maintaining for about 5 seconds at a time. He required max cueing for motor planning turn to the shower and mod A for RW management. He required mod A to doff shirt. He almost fell off the TTB with a large weight shift forward. Incredibly poor awareness- pt throughout session stating I can do it by myself. He completed UB bathing with min cueing for L attention. LB bathing in standing with heavy use of grab bar. He required frequent cueing and facilitation for LUE to grasp the washcloth. He transferred back to the EOB with similar assist. Spent extra time problem solving through donning socks, trialing propping one leg up on the  bed, single hand technique, and bending down with BUE on the sock. None were successful and this made him very frustrated. Recommended that his SO don socks for him at home. He demonstrated significant motor planning deficits when dressing, requiring max A for shirt and pants. Transfer to the w/c and then pt taken to the therapy gym. He completed visual scanning activity on the BITS using the Bell cancellation test with min-mod cueing for head turns and thoroughness of task.  Had Joshua Harmon come down to the tub room to witness shower transfer using TTB in bathtub. This was an incredibly hard and unsafe transfer. Do NOT recommend she do this at home- had extended conversation with her about this. She is very realistic and understandably nervous about d/c. DO rounding during session and reporting ritalin  was d/c yesterday so this could be part of the increased difficulty today. He required max A to prevent fall from TTB- sliding forward with BLE extension (tone?) and 0 awareness of positioning. Joshua Harmon, direct cueing required to prevent fall. Max A for the stand pivots. He returned to the w/c and to his room. Pass off to SLP.   Therapy Documentation Precautions:  Precautions Precautions: Fall Recall of Precautions/Restrictions: Impaired Precaution/Restrictions Comments: Poor awareness, L lean, ataxic Restrictions Weight Bearing Restrictions Per Provider Order: No   Therapy/Group: Individual Therapy  Nena VEAR Moats 08/15/2024, 9:10 AM

## 2024-08-15 NOTE — Progress Notes (Signed)
 Patient ID: Joshua Harmon, male   DOB: [birth year], 63 y.o.   MRN: [id number]  SW faxed forms per pt request to [employer] ([id number]:[phone]/f:[phone]).  [name] Jude, MSW, LCSW Office: [phone] Cell: [phone] Fax: [phone]

## 2024-08-16 DIAGNOSIS — D496 Neoplasm of unspecified behavior of brain: Secondary | ICD-10-CM | POA: Diagnosis not present

## 2024-08-16 MED ORDER — BACLOFEN 5 MG HALF TABLET
5.0000 mg | ORAL_TABLET | Freq: Two times a day (BID) | ORAL | Status: DC
  Administered 2024-08-17 – 2024-08-21 (×10): 5 mg via ORAL
  Filled 2024-08-16 (×11): qty 1

## 2024-08-16 NOTE — Progress Notes (Signed)
 Physical Therapy Session Note  Patient Details  Name: Joshua Harmon MRN: [id number] Date of Birth: 1961/10/08  Today's Date: 08/16/2024 PT Individual Time: 9067-8984 PT Individual Time Calculation (min): 43 min   Today's Date: 08/16/2024 PT Individual Time: 8697-8585 PT Individual Time Calculation (min): 72 min   Short Term Goals: Week 2:  PT Short Term Goal 1 (Week 2): Pt will complete bed mobility with min A PT Short Term Goal 2 (Week 2): Pt will perform transfers with min A PT Short Term Goal 3 (Week 2): Pt will perform gait x 25' with mod+2  Skilled Therapeutic Interventions/Progress Updates:     1st Session: Pt received seated in [facility] and agrees to therapy. No complaint of pain. Pt's significant other present for family education. PT advises pt on recommendation for ramp for home entry and pt insists that he will not need a ramp. PT educates on rationale for use of ramp and WC and pt continues to insist that he is no different than he was before coming to rehab, and that he will be just fine. PT provides education to significant other and pt on importance of attempting car transfer to adequately prepare for discharge, as pt will be driving directly to chemo appointment. Pt is adamant that he will be able to perform without difficulty and pt's significant other requesting to defer car transfer at this time due to frustration with patient. PT offers to perform car transfer during upcoming sessions on Friday or Saturday and pt and significant other are agreeable. Pt left seated with all needs within reach.   2nd Session: Pt received seated in [facility] and agrees to therapy. No complaint of pain and appears more agreeable to intervention than in AM session. Significant other not present. WC transport to gym for time management. Pt tasked with completing peg board recreation activity in standing with LUE support on elevated mat table to provide approximation of joints and increased NM feedback. Pt  performs sit to stand with modA and cues for hand placement and body mechanics. Pt has significant difficulty with pattern recreation, requiring frequent cues to attend to Lt side and for accuracy of peg placement. PT also provides hand over hand assistance on Lt hand to provide increased loading through LUE and facilitation of stable base. Pt requires 2-3 seated rest breaks during activity. For final bout, PT places 6 step under RLE to promote increased Lt sided weight bearing. Pt then requires modA to maxA with heavy Lt sided lean. Pt has difficulty correct posture and eventually has to sit back down to prevent total LOB. Pt requests to weight himself on scale. Pt performs sit to stand with modA +2 with bilateral HHA. Pt performs step up onto scale with cues for sequencing and hand placement. Following, pt steps backward x5 with modA +2 and improved motor planning than is typical for this patient. Seated rest break. Pt transfer to Nustep with minA and cues for positioning. Pt completes Nustep for reciprocal coordination training. Pt completes x20:00 at workload of 5 with average steps per minute ~68. PT provides cues for hand and foot placement and completing full available ROM.  Pt performs stand step back to [facility] with CGA and cues for sequencing. Left seated with all needs within reach and alarm intact.   Therapy Documentation Precautions:  Precautions Precautions: Fall Recall of Precautions/Restrictions: Impaired Precaution/Restrictions Comments: Poor awareness, L lean, ataxic Restrictions Weight Bearing Restrictions Per Provider Order: No   Therapy/Group: Individual Therapy  [name],  PT, DPT 08/16/2024, 3:47 PM

## 2024-08-16 NOTE — Progress Notes (Signed)
 PROGRESS NOTE   Subjective/Complaints:  No events overnight.  No acute complaints.  Per PT, has always had some increased ataxia in the right lower extremity, has noticed a slight increase in that in the last few days.  Joshua Harmon denies any discomfort. Vitals are stable Continent of urine x 3 overnight, small bowel movement Therapy notes,.  That his attention improved yesterday after resumption of Ritalin .     ROS: Joshua Harmon denies chills, rash, sore throat, blurred vision, dizziness, nausea, vomiting, diarrhea, cough, shortness of breath or chest pain, joint or back/neck pain,   or mood change.  Denies new motor or sensory changes.  Objective:   No results found.  No results for input(s): WBC, HGB, HCT, PLT in the last 72 hours.   No results for input(s): NA, K, CL, CO2, GLUCOSE, BUN, CREATININE, CALCIUM in the last 72 hours.     Intake/Output Summary (Last 24 hours) at 08/16/2024 1019 Last data filed at 08/16/2024 0458 Gross per 24 hour  Intake 476 ml  Output 650 ml  Net -174 ml        Physical Exam: Vital Signs Blood pressure 116/88, pulse 72, temperature 98.5 F (36.9 C), resp. rate 18, height 6' 1 (1.854 m), weight 69.5 kg, SpO2 97%.  Constitutional: No distress .  Awake, alert.  Practicing ambulation with contact-guard assist with therapies. HEENT: right crani site CDI.  EOMI, oral membranes moist Neck: supple Cardiovascular: RRR without murmur. No JVD    Respiratory/Chest: Clear to auscultation bilaterally, nonlabored breathing GI/Abdomen: BS +, non-tender, non-distended, soft Ext: no clubbing, cyanosis, or edema Psych: pleasant and cooperative  Skin: C/D/I. No apparent lesions. .  Well-approximated.  Right crani site well-approximated, healing.  Neurologic exam:    Pt alert and oriented x 3. Right gaze preference./Left hemineglect--ongoing Some moderate difficulty with attention and  insight--increased today   MMT: RUE and RLE grossly 5/5 prox to distal--unchanged LUE 5-/5 LUE and LLE with exception of 2-3/5 ADF/PF.  --Complicated by hemineglect  Sensation appears grossly intact to LT and pain in all 4's.  DTR's 1+.  Significant ataxia in left upper extremity, mild in left lower extremity, not really appreciable in right lower extremity. Tone: MAS 1-2 left elbow flexor, shoulder adductor, knee flexor knee extensor--unchanged; had increasingly position to the right     Assessment/Plan: 1. Functional deficits which require 3+ hours per day of interdisciplinary therapy in a comprehensive inpatient rehab setting. Physiatrist is providing close team supervision and 24 hour management of active medical problems listed below. Physiatrist and rehab team continue to assess barriers to discharge/monitor Joshua Harmon progress toward functional and medical goals  Care Tool:  Bathing    Body parts bathed by Joshua Harmon: Left arm, Chest, Abdomen, Front perineal area, Buttocks, Right upper leg, Left upper leg, Right lower leg, Left lower leg, Face, Right arm   Body parts bathed by helper: Right lower leg, Left lower leg Body parts n/a: Right arm   Bathing assist Assist Level: Moderate Assistance - Joshua Harmon 50 - 74%     Upper Body Dressing/Undressing Upper body dressing   What is the Joshua Harmon wearing?: Pull over shirt    Upper body assist Assist Level: Moderate  Assistance - Joshua Harmon 50 - 74%    Lower Body Dressing/Undressing Lower body dressing      What is the Joshua Harmon wearing?: Underwear/pull up, Pants     Lower body assist Assist for lower body dressing: Moderate Assistance - Joshua Harmon 50 - 74%     Toileting Toileting    Toileting assist Assist for toileting: Moderate Assistance - Joshua Harmon 50 - 74%     Transfers Chair/bed transfer  Transfers assist     Chair/bed transfer assist level: Moderate Assistance - Joshua Harmon 50 - 74%      Locomotion Ambulation   Ambulation assist   Ambulation activity did not occur: Safety/medical concerns (2/2 strong posture deficit and fatigue)  Assist level: Moderate Assistance - Joshua Harmon 50 - 74% Assistive device: Walker-rolling Max distance: 180'   Walk 10 feet activity   Assist  Walk 10 feet activity did not occur: Safety/medical concerns (2/2 strong posture deficit and fatigue)  Assist level: Moderate Assistance - Joshua Harmon - 50 - 74% Assistive device: Walker-rolling   Walk 50 feet activity   Assist Walk 50 feet with 2 turns activity did not occur: Safety/medical concerns (2/2 strong posture deficit and fatigue)  Assist level: Moderate Assistance - Joshua Harmon - 50 - 74% Assistive device: Walker-rolling    Walk 150 feet activity   Assist Walk 150 feet activity did not occur: Safety/medical concerns (2/2 strong posture deficit and fatigue)  Assist level: Moderate Assistance - Joshua Harmon - 50 - 74% Assistive device: Walker-rolling    Walk 10 feet on uneven surface  activity   Assist Walk 10 feet on uneven surfaces activity did not occur: Safety/medical concerns (2/2 strong posture deficit and fatigue)         Wheelchair     Assist Is the Joshua Harmon using a wheelchair?: Yes Type of Wheelchair: Manual    Wheelchair assist level: Dependent - Joshua Harmon 0% Max wheelchair distance: 340ft    Wheelchair 50 feet with 2 turns activity    Assist        Assist Level: Dependent - Joshua Harmon 0%   Wheelchair 150 feet activity     Assist      Assist Level: Dependent - Joshua Harmon 0%   Blood pressure 116/88, pulse 72, temperature 98.5 F (36.9 C), resp. rate 18, height 6' 1 (1.854 m), weight 69.5 kg, SpO2 97%.  Medical Problem List and Plan: 1. 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 Janjua 9/8             -Joshua Harmon may  shower             -ELOS/Goals: 12-14 days, supervision to min assist with self-care and  mobility, mod I to supervision with cognition --  DC 9/30 to appointment  -Continue CIR therapies including PT, OT, and SLP     - 9/16: On/off confusion for night shift; telesitter started yesterday. Poor proprioception in LUE and LLE; difficulty with L sided attention. Max A LBD, Mod-Max A transfers, making very good progress. Mod A for bed mobility and transfers, walked 50 ft with Min A x2. L ataxia. Perseverative. Mod-severe issues with memory and problem solving. L oral pocketing.    - Will need note for driving restrictions  -0-81: Dr. Eward NP will follow-up with Joshua Harmon , they will be meeting with him at 1 PM this coming Tuesday.  Unsure of plan at this time.  May need to facilitate early discharge if chemo/radiation therapy required urgently--discussed with family 9/19--appointment changed to  9-30 at 2 PM, with plans to discharge to appointment per Dr. Buckley  -- Teams tomorrow a.m., will need to discuss adjustment of goals to accommodate timeline for discharge   - 9/23: Motor planning and proprioceptive deficits - gets into unsafe positions with transfers and turns. SLP L inattention limiting cog - goals Min-Mod A.   2.  Antithrombotics: -DVT/anticoagulation:  Mechanical:  Antiembolism stockings, knee (TED hose) Bilateral lower extremities Sequential compression devices, below knee Bilateral lower extremities             -antiplatelet therapy: N/A  3. Pain Management: Tylenol , Fioricet as needed  - no pain endorsed  4. Mood/Behavior/Sleep: LCSW to follow for evaluation and support when available.              -antipsychotic agents: N/A   - 9/16: Started on telesitter overnight for impulsivity; will start sleep log + trazodone  50 mg at bedtime PRN--sleep log appropriate  5. Neuropsych/cognition: This Joshua Harmon is not capable of making decisions on his own behalf.   - 9/16: start ritalin  5 mg BID for attention and lethargy--continue trial, arousal much improved but concentration remains  difficult  9-19: Per Joshua Harmon's fianc, seen considerable improvements.  Ongoing severe deficits per therapy note; concurrent continue current regimen  9-22: Remains very poor insight, no appreciable improvements.  Will DC Ritalin  for trial.  9-24: Resume Ritalin  due to apparent worse hemineglect with therapies today, worse attention  6. Skin/Wound Care: Routine pressure-relief measures             -Per neurosurgery staple removal planned for this week (POD #10 9/18)--confirmed with Dr. Rosslyn, order placed to remove staples 9-19  7. Fluids/Electrolytes/Nutrition: Monitor intake and output and routine follow-up labs in a.m.              - on regular/ thins diet             - GERD: continue PPI  8.  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 Janjua on 9/12 decrease to 2 mg q8h indefinitely until radiation treatment begins - 9-19: IDH testing unable to be performed due to insufficient sample; per Dr. Buckley, will not alter or delay treatment, will send out  9.  Impaired glucose tolerance: Secondary to steroids CBGs have been below 180 has not required coverage.              - CBG monitoring was every 4 hours now 2x daily --has been stable, continue to hold SSI   - 9/15: CBG remain between 100 and 150; can stop CBGs    10.  Constipation: pt had bm yesterday -senokot-s 1 tab at bedtime -miralax  prn             Last bowel movement 9-17, small x2  Lbm today 9/23, small  11.  Leukocytosis.  likely secondary to Decadron . check urine and chest x-ray today to screen for secondary causes.  Low threshold for blood cultures and broad-spectrum antibiotics if he develops vital instability.  - 9-14: Urinalysis clear, chest x-ray with no acute process.  Vitally stable, afebrile.  Trend  9/15 sl increase to 15k today. Pt afebrile, no s/s infection. UA/CXR as above  9-22: Remains slightly elevated on  Decadron  2 mg every 8; afebrile, no signs of infection, monitor.  Will need to clarify if any further taper closer to [facility] continue current regiment for now given good  tolerance and no current treatment.  12.  Right neck tone/spasticity.  Start baclofen  5 mg 3 times daily.  Not currently bothersome, but want to prevent creasing and skin breakdown if possible.  - 9-14: Head positioning much better today, likely more related to hemineglect.  Move baclofen  to 3 times daily as needed.  9/15 pt fairly neutral today--left inattention is biggest factor  -18: Range of motion, inattention improving.  9-23: Starting to develop some spasticity in the left elbow.  Not using PRNs, not bothersome at this time.  Can range to neutral easily.  Continue to monitor  9-24: Some increased spasticity noted today, as long with worsening head positioning.  Joshua Harmon not utilizing PRNs, will schedule baclofen  again 5 mg twice daily  13.  Hyponatremia.  Mild, 133, but downtrending.  Will get blood and urine studies today to evaluate for SIADH; may also be related to ongoing glucocorticoids.  Placed fluid restriction.  - 9/24: Joshua Harmon refused labs yesterday; est serum osm 278. 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 Continue fluid restriction.  Discussed this with him today  - BMP tomorrow a.m. LOS: 13 days A FACE TO FACE EVALUATION WAS PERFORMED  Joshua Harmon Likes 08/16/2024, 10:19 AM

## 2024-08-16 NOTE — Progress Notes (Signed)
 Speech Language Pathology Daily Session Note  Patient Details  Name: TRAYTON SZABO MRN: [id number] Date of Birth: 07/17/1961  Today's Date: 08/16/2024 SLP Individual Time: 0800-0830 SLP Individual Time Calculation (min): 30 min  Short Term Goals: Week 2: SLP Short Term Goal 1 (Week 2): Pt will utilize compensatory memory strategies as needed to recall recent/relevant info w/ modA SLP Short Term Goal 2 (Week 2): Pt will sustain attention to task in a mildly distracting environment for ~10 mins given modA SLP Short Term Goal 3 (Week 2): Pt will solve functional problems w/ modA SLP Short Term Goal 4 (Week 2): Pt will improve L visual attention to modA  Skilled Therapeutic Interventions:   Pt and his significant other greeted at bedside for tx targeting cognition and family education. He was within 3 days of the current date during orientation review. SLP then facilitated conversation re upcoming d/c, remaining cognitive deficits, and safety restrictions upon d/c. He then completed a money management task calculating totals and benefited from modA cues for L visual attention, organization, and working memory. At the end of tx tasks, he was left w/ the alarm set and call light within reach. His significant other remained present as well. Recommend cont ST per POC.   Pain  None reported  Therapy/Group: Individual Therapy  [name] 08/16/2024, 7:51 AM

## 2024-08-16 NOTE — Progress Notes (Signed)
 Patient ID: Joshua Harmon, male   DOB: 1961/08/18, 63 y.o.   MRN: [id number]  SW sent transportation application to [employer]- [email or web address] .  [name] Jude, MSW, LCSW Office: [phone] Cell: [phone] Fax: [phone]

## 2024-08-16 NOTE — Progress Notes (Signed)
 Occupational Therapy Session Note  Patient Details  Name: Joshua Harmon MRN: [id number] Date of Birth: October 18, 1961  Today's Date: 08/16/2024 OT Individual Time: 9169-9069 OT Individual Time Calculation (min): 60 min    Short Term Goals: Week 1:  OT Short Term Goal 1 (Week 1): Pt will don shirt with mod A OT Short Term Goal 1 - Progress (Week 1): Met OT Short Term Goal 2 (Week 1): Pt will complete a stand pivot transfer to the w/c with mod A OT Short Term Goal 2 - Progress (Week 1): Met OT Short Term Goal 3 (Week 1): Pt will improve LUE ataxia by using the LUE during bathing with min A OT Short Term Goal 3 - Progress (Week 1): Met OT Short Term Goal 4 (Week 1): Pt will don pants with mod A OT Short Term Goal 4 - Progress (Week 1): Met OT Short Term Goal 5 (Week 1): Pt will right trunk and head in sitting with no more than mod facilitation OT Short Term Goal 5 - Progress (Week 1): Progressing toward goal Week 2:  OT Short Term Goal 1 (Week 2): Pt will complete LB dressing with min A OT Short Term Goal 2 (Week 2): Pt will complete stand pivot with min A OT Short Term Goal 3 (Week 2): Pt will complete sit > stand with min A OT Short Term Goal 4 (Week 2): Pt will right head/trunk to midline with no more than mod cueing  Skilled Therapeutic Interventions/Progress Updates:    1:1 Fam education with wife. Focus on bed mobility and stand pivot transfers bed <>standard w/c demonstrated and then return demonstration with Joshua Harmon with use of gait belt. Discussed bathing dressing at bed level if want to do it total A or in the w/c sit to stand at a counter or the kitchen table for Ue support. Pt performed bathing and dressing at sink level focus on providing cues and setup for success. Pt wants to be as independent as possible- discuss how to help him to use his UE safely in functional tasks. Discussed that the bathroom door maybe too narrow for a w/c and walking at home is not recommended at this time  due to burden of care is too great and not safe. Discussed and recommended a drop arm BSC for home use. Also discussed access to his house. He has 3 steps to get in- discussed bumping him up the stairs with another caregiver or a ramp. Gave tape measure for Joshua Harmon to measure doorways and porch height.    Joshua Harmon reports her plan is for them to d/c on Tues from here and go to [facility] appointment at [facility] at 2 and then home.  Hand off to PT for their fam education and continued practiced with hands on transfers and sit to stands but Joshua Harmon needed a break and took a shower. See PT note.  Therapy Documentation Precautions:  Precautions Precautions: Fall Recall of Precautions/Restrictions: Impaired Precaution/Restrictions Comments: Poor awareness, L lean, ataxic Restrictions Weight Bearing Restrictions Per Provider Order: No General:   Vital Signs: Therapy Vitals Temp: 98.5 F (36.9 C) Pulse Rate: 72 Resp: 18 BP: 116/88 Patient Position (if appropriate): Lying Oxygen Therapy SpO2: 97 % O2 Device: Room Air Pain:  No c/o   Therapy/Group: Individual Therapy  Joshua Harmon 08/16/2024, 8:09 AM

## 2024-08-17 DIAGNOSIS — D496 Neoplasm of unspecified behavior of brain: Secondary | ICD-10-CM | POA: Diagnosis not present

## 2024-08-17 LAB — BASIC METABOLIC PANEL WITH GFR
Anion gap: 12 (ref 5–15)
BUN: 16 mg/dL (ref 8–23)
CO2: 22 mmol/L (ref 22–32)
Calcium: 8.5 mg/dL — ABNORMAL LOW (ref 8.9–10.3)
Chloride: 98 mmol/L (ref 98–111)
Creatinine, Ser: 0.7 mg/dL (ref 0.61–1.24)
GFR, Estimated: >60 mL/min (ref >60)
Glucose, Bld: 114 mg/dL — ABNORMAL HIGH (ref 70–99)
Potassium: 4.5 mmol/L (ref 3.5–5.1)
Sodium: 132 mmol/L — ABNORMAL LOW (ref 135–145)

## 2024-08-17 NOTE — Progress Notes (Addendum)
 Patient ID: Joshua Harmon, male   DOB: 10/09/1961, 63 y.o.   MRN: [id number]  SW spoke with [employer] and informed his application is active to schedule transportation.  Will send his application to appropriate staff for processing.  SW spoke with pt s/o [name] to discuss [facility] preference. SW will explore options, and follow-up.   Declined HH[name]- no contracts with [employer]  [name] Jude, MSW, LCSW Office: [phone] Cell: [phone] Fax: [phone]

## 2024-08-17 NOTE — Progress Notes (Signed)
 PROGRESS NOTE   Subjective/Complaints:  No events overnight.  No acute complaints.   A.m. labs significant for ongoing hyponatremia, 132.  Otherwise, labs stable.  ROS: Patient denies chills, rash, sore throat, blurred vision, dizziness, nausea, vomiting, diarrhea, cough, shortness of breath or chest pain, joint or back/neck pain,   or mood change.  Denies new motor or sensory changes.  Objective:   No results found.  No results for input(s): WBC, HGB, HCT, PLT in the last 72 hours.   Recent Labs    08/17/24 0550  NA 132*  K 4.5  CL 98  CO2 22  GLUCOSE 114*  BUN 16  CREATININE 0.70  CALCIUM 8.5*       Intake/Output Summary (Last 24 hours) at 08/17/2024 1031 Last data filed at 08/17/2024 0839 Gross per 24 hour  Intake 720 ml  Output 750 ml  Net -30 ml        Physical Exam: Vital Signs Blood pressure (!) 111/90, pulse 71, temperature 97.8 F (36.6 C), resp. rate 16, height 6' 1 (1.854 m), weight 69.5 kg, SpO2 95%.  Constitutional: No distress .  Awake, alert.  Sitting up in bedside chair. HEENT: right crani site CDI.  EOMI, oral membranes moist Neck: supple Cardiovascular: RRR without murmur. No JVD    Respiratory/Chest: Clear to auscultation bilaterally, nonlabored breathing GI/Abdomen: BS +, non-tender, non-distended, soft Ext: no clubbing, cyanosis, or edema Psych: pleasant and cooperative  Skin: C/D/I. No apparent lesions. .  Well-approximated.  Right crani site well-approximated, healing.  Neurologic exam:    Pt alert and oriented x 3. Right gaze preference./Left hemineglect--ongoing Some moderate difficulty with attention and insight--seems to be doing better, performed several complex cognitive tasks today with some increased time   MMT: RUE and RLE grossly 5/5 prox to distal--unchanged LUE 5-/5 LUE and LLE .  --Complicated by hemineglect  Sensation slightly altered to light touch in  left upper extremity DTR's 1+ on the right, 2+ on the left Significant ataxia in left upper extremity, mild in left lower extremity, not really appreciable in right lower extremity. Tone: MAS 2 left elbow flexor, 1 shoulder adductor,1  knee flexor 1 knee extensor . Head remains preferentially position to the right, but can bring to midline    Assessment/Plan: 1. Functional deficits which require 3+ hours per day of interdisciplinary therapy in a comprehensive inpatient rehab setting. Physiatrist is providing close team supervision and 24 hour management of active medical problems listed below. Physiatrist and rehab team continue to assess barriers to discharge/monitor patient progress toward functional and medical goals  Care Tool:  Bathing    Body parts bathed by patient: Left arm, Chest, Abdomen, Front perineal area, Right upper leg, Left upper leg, Face, Right arm   Body parts bathed by helper: Right lower leg, Left lower leg Body parts n/a: Right arm   Bathing assist Assist Level: Moderate Assistance - Patient 50 - 74%     Upper Body Dressing/Undressing Upper body dressing   What is the patient wearing?: Pull over shirt    Upper body assist Assist Level: Moderate Assistance - Patient 50 - 74%    Lower Body Dressing/Undressing Lower body dressing  What is the patient wearing?: Underwear/pull up, Pants     Lower body assist Assist for lower body dressing: Moderate Assistance - Patient 50 - 74%     Toileting Toileting    Toileting assist Assist for toileting: Moderate Assistance - Patient 50 - 74%     Transfers Chair/bed transfer  Transfers assist     Chair/bed transfer assist level: Minimal Assistance - Patient > 75%     Locomotion Ambulation   Ambulation assist   Ambulation activity did not occur: Safety/medical concerns (2/2 strong posture deficit and fatigue)  Assist level: Moderate Assistance - Patient 50 - 74% Assistive device:  Walker-rolling Max distance: 180'   Walk 10 feet activity   Assist  Walk 10 feet activity did not occur: Safety/medical concerns (2/2 strong posture deficit and fatigue)  Assist level: Moderate Assistance - Patient - 50 - 74% Assistive device: Walker-rolling   Walk 50 feet activity   Assist Walk 50 feet with 2 turns activity did not occur: Safety/medical concerns (2/2 strong posture deficit and fatigue)  Assist level: Moderate Assistance - Patient - 50 - 74% Assistive device: Walker-rolling    Walk 150 feet activity   Assist Walk 150 feet activity did not occur: Safety/medical concerns (2/2 strong posture deficit and fatigue)  Assist level: Moderate Assistance - Patient - 50 - 74% Assistive device: Walker-rolling    Walk 10 feet on uneven surface  activity   Assist Walk 10 feet on uneven surfaces activity did not occur: Safety/medical concerns (2/2 strong posture deficit and fatigue)         Wheelchair     Assist Is the patient using a wheelchair?: Yes Type of Wheelchair: Manual    Wheelchair assist level: Dependent - Patient 0% Max wheelchair distance: 36ft    Wheelchair 50 feet with 2 turns activity    Assist        Assist Level: Dependent - Patient 0%   Wheelchair 150 feet activity     Assist      Assist Level: Dependent - Patient 0%   Blood pressure (!) 111/90, pulse 71, temperature 97.8 F (36.6 C), resp. rate 16, height 6' 1 (1.854 m), weight 69.5 kg, SpO2 95%.  Medical Problem List and Plan: 1. 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 Janjua 9/8             -patient may  shower             -ELOS/Goals: 12-14 days, supervision to min assist with self-care and mobility, mod I to supervision with cognition --  DC 9/30 to appointment  -Continue CIR therapies including PT, OT, and SLP     - 9/16: On/off confusion for night shift; telesitter started yesterday. Poor proprioception  in LUE and LLE; difficulty with L sided attention. Max A LBD, Mod-Max A transfers, making very good progress. Mod A for bed mobility and transfers, walked 50 ft with Min A x2. L ataxia. Perseverative. Mod-severe issues with memory and problem solving. L oral pocketing.    - Will need note for driving restrictions  -0-81: Dr. Eward NP will follow-up with patient , they will be meeting with him at 1 PM this coming Tuesday.  Unsure of plan at this time.  May need to facilitate early discharge if chemo/radiation therapy required urgently--discussed with family 9/19--appointment changed to 9-30 at 2 PM, with plans to discharge to appointment per Dr. Buckley  -- Teams tomorrow a.m., will need  to discuss adjustment of goals to accommodate timeline for discharge   - 9/23: Motor planning and proprioceptive deficits - gets into unsafe positions with transfers and turns. SLP L inattention limiting cog - goals Min-Mod A.   2.  Antithrombotics: -DVT/anticoagulation:  Mechanical:  Antiembolism stockings, knee (TED hose) Bilateral lower extremities Sequential compression devices, below knee Bilateral lower extremities             -antiplatelet therapy: N/A  3. Pain Management: Tylenol , Fioricet as needed  - no pain endorsed  4. Mood/Behavior/Sleep: LCSW to follow for evaluation and support when available.              -antipsychotic agents: N/A   - 9/16: Started on telesitter overnight for impulsivity; will start sleep log + trazodone  50 mg at bedtime PRN--sleep log appropriate  5. Neuropsych/cognition: This patient is not capable of making decisions on his own behalf.   - 9/16: start ritalin  5 mg BID for attention and lethargy--continue trial, arousal much improved but concentration remains difficult  9-19: Per patient's fianc, seen considerable improvements.  Ongoing severe deficits per therapy note; concurrent continue current regimen  9-22: Remains very poor insight, no appreciable improvements.  Will  DC Ritalin  for trial.  9-24: Resume Ritalin  due to apparent worse hemineglect with therapies today, worse attention--much improved 9-26  6. Skin/Wound Care: Routine pressure-relief measures             -Per neurosurgery staple removal planned for this week (POD #10 9/18)--confirmed with Dr. Rosslyn, order placed to remove staples 9-19  7. Fluids/Electrolytes/Nutrition: Monitor intake and output and routine follow-up labs in a.m.              - on regular/ thins diet             - GERD: continue PPI  8.  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 Janjua on 9/12 decrease to 2 mg q8h indefinitely until radiation treatment begins - 9-19: IDH testing unable to be performed due to insufficient sample; per Dr. Buckley, will not alter or delay treatment, will send out  9.  Impaired glucose tolerance: Secondary to steroids CBGs have been below 180 has not required coverage.              - CBG monitoring was every 4 hours now 2x daily --has been stable, continue to hold SSI   - 9/15: CBG remain between 100 and 150; can stop CBGs    10.  Constipation: pt had bm yesterday -senokot-s 1 tab at bedtime -miralax  prn    11.  Leukocytosis.  likely secondary to Decadron . check urine and chest x-ray today to screen for secondary causes.  Low threshold for blood cultures and broad-spectrum antibiotics if he develops vital instability.  - 9-14: Urinalysis clear, chest x-ray with no acute process.  Vitally stable, afebrile.  Trend  9/15 sl increase to 15k today. Pt afebrile, no s/s infection. UA/CXR as above  9-22: Remains slightly elevated on Decadron  2 mg every 8; afebrile, no signs of infection, monitor.  Will need to clarify if any further taper closer to [facility] continue current regiment for now given good tolerance and no current treatment.  12.  Right neck tone/spasticity.  Start baclofen  5 mg  3 times daily.  Not currently bothersome, but want to prevent creasing and skin breakdown if possible.  - 9-14: Head positioning much  better today, likely more related to hemineglect.  Move baclofen  to 3 times daily as needed.  9/15 pt fairly neutral today--left inattention is biggest factor  -18: Range of motion, inattention improving.  9-23: Starting to develop some spasticity in the left elbow.  Not using PRNs, not bothersome at this time.  Can range to neutral easily.  Continue to monitor  9-24: Some increased spasticity noted today, as long with worsening head positioning.  Patient not utilizing PRNs, will schedule baclofen  again 5 mg twice daily--tolerating well, mild improvements  13.  Hyponatremia.  Mild, 133, but downtrending.  Will get blood and urine studies today to evaluate for SIADH; may also be related to ongoing glucocorticoids.  Placed fluid restriction.  - 9/24: Patient refused labs yesterday; est serum osm 278. 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 Continue fluid restriction.  Discussed this with him today  -9-26: Very slow downtrend, 132 today.  Repeat labs in a.m., if further downtrending would start salt tabs 1 g twice daily.  LOS: 14 days A FACE TO FACE EVALUATION WAS PERFORMED  Joshua Harmon Likes 08/17/2024, 10:31 AM

## 2024-08-17 NOTE — Plan of Care (Signed)
 D/c'd d/t limited progress thus far and updated d/c date Problem: RH Awareness Goal: LTG: Patient will demonstrate awareness during functional activites type of (SLP) Description: LTG: Patient will demonstrate awareness during functional activites type of (SLP) Outcome: Not Applicable   Updated d/t limited progress thus far and updated d/c date Problem: RH Attention Goal: LTG Patient will demonstrate this level of attention during functional activites (SLP) Description: LTG:  Patient will will demonstrate this level of attention during functional activites (SLP) Flowsheets (Taken 08/17/2024 1207) LTG: Patient will demonstrate this level of attention during cognitive/linguistic activities with assistance of (SLP): Moderate Assistance - Patient 50 - 74% Number of minutes patient will demonstrate attention during cognitive/linguistic activities: 15

## 2024-08-17 NOTE — Progress Notes (Signed)
 Speech Language Pathology Weekly Progress and Session Note  Patient Details  Name: Joshua Harmon MRN: [id number] Date of Birth: 1961/06/06  Beginning of progress report period: August 10, 2024 End of progress report period: August 17, 2024  Today's Date: 08/17/2024 SLP Individual Time: 0800-0830 SLP Individual Time Calculation (min): 30 min  Short Term Goals: Week 2: SLP Short Term Goal 1 (Week 2): Pt will utilize compensatory memory strategies as needed to recall recent/relevant info w/ modA SLP Short Term Goal 1 - Progress (Week 2): Met SLP Short Term Goal 2 (Week 2): Pt will sustain attention to task in a mildly distracting environment for ~10 mins given modA SLP Short Term Goal 2 - Progress (Week 2): Met SLP Short Term Goal 3 (Week 2): Pt will solve functional problems w/ modA SLP Short Term Goal 3 - Progress (Week 2): Not met SLP Short Term Goal 4 (Week 2): Pt will improve L visual attention to modA SLP Short Term Goal 4 - Progress (Week 2): Not met    New Short Term Goals: Week 3: SLP Short Term Goal 1 (Week 3): STGs = LTGs d/t ELOS  Weekly Progress Updates: Pt has made slight progress this week, as demonstrated by slightly improved recall and attention. Emerging success noted w/ problem solving and L visual attention, though variable success. Moderate to severe cognitive deficits remain overall. Pt/family education ongoing. He would benefit from continued ST to target remaining deficits, maximize pt independence, and reduce caregiver burden. LTGs updated.   Intensity: Minumum of 1-2 x/day, 30 to 90 minutes Frequency: 3 to 5 out of 7 days Duration/Length of Stay: 9/30 Treatment/Interventions: Cognitive remediation/compensation;Cueing hierarchy;Functional tasks;Patient/family education;Speech/Language facilitation;Therapeutic Exercise;Environmental controls;Therapeutic Activities   Daily Session  Skilled Therapeutic Interventions:     Pt greeted at bedside for tx  targeting cognition. He was within 2 days of current date during orientation review but required reminder that d/c date is currently set for 9/30, not tomorrow. SLP facilitated recall task w/ 12 pics. He benefited from minA to ID associations (3 categories) between items. Utilizing the association strategy, he was able to recall 11/12 items immediately. After ~ 18 min delay, he was bale to recall 10/12 independently. Between recall times, he was challenged to a visual task unscrambling words. He benefited from [facility] for L visual attention and problem solving, At the end of tx tasks, he was left in his bed w/ the alarm set and call light within reach. Recommend cont ST per POC.   Pain  No pain reported  Therapy/Group: Individual Therapy  [name] 08/17/2024, 8:33 AM

## 2024-08-17 NOTE — Progress Notes (Addendum)
 Physical Therapy Session Note  Patient Details  Name: Joshua Harmon MRN: [id number] Date of Birth: 1961/04/21  Today's Date: 08/17/2024 PT Individual Time: 0926-1005, 1300-1415 PT Individual Time Calculation (min): 39 min, 75 min  Short Term Goals: Week 2:  PT Short Term Goal 1 (Week 2): Pt will complete bed mobility with min A PT Short Term Goal 2 (Week 2): Pt will perform transfers with min A PT Short Term Goal 3 (Week 2): Pt will perform gait x 25' with mod+2  Skilled Therapeutic Interventions/Progress Updates:   Session 1:  Pt received upright in wc, agreeable to therapy and no c/o pain. Pt wheeled to day room for energy conservation, cued to keep BLE off floor on way to day room, pt requiring rest breaks as L heel caught on floor multiple times.  Pt performed circuit including ball catch/toss, gait, and tic tac toe. Pt demonstrated little to no safety awareness in gait, requiring minA-maxA to remain upright. Pt utilized RW for gait initially. Pt largely unable to follow commands/cueing during gait, demonstrating ataxia, apraxia, inconsistent step length and base of support. RW removed and pt utilized HHA and maxA and gait was much improved, more consistent step length and base of support. Pt demonstrated poor seated balance during tic tac toe activity, requiring SBA for safety. Ball toss, pt required minA HOH to catch ball.  At end of session, pt wheeled back for time. Chair alarm belt in place and all needs within reach.   Session 2:  Pt received upright in wc, agreeable to therapy and no c/o pain. Pt wheeled to day room for energy conservation. Pt performed multiple sets of seated SLR w/ ankle weights, challenging B hip flexors to improve strength and carryover into gait mechanics. Pt presents w/ reclined/slumped posture in wc, attempted multiple times to move his hips back but with unsafe mechanics, kicking into back of chair and pulling casters off ground. Pt requires supervision at  rest in wc. Pt given increased rest in between sets. Pt performed stand step transfer from wc to NuStep w/ maxA HHA to stand, demonstrating poor body mechanics, pulling himself up with RUE and BLE extended in front of him. On NuStep, pt completed 20 minutes on lvl 7, stopping only once for ~30sec. Pt declined when offered to take a break, insisting he needed the work.  At end of session, pt wheeled back to room for energy conservation, left w/ chair alarm belt in place and all needs within reach.   Therapy Documentation Precautions:  Precautions Precautions: Fall Recall of Precautions/Restrictions: Impaired Precaution/Restrictions Comments: Poor awareness, L lean, ataxic Restrictions Weight Bearing Restrictions Per Provider Order: No   Therapy/Group: Individual Therapy  Oneil Grumbles 08/17/2024, 12:26 PM

## 2024-08-17 NOTE — Discharge Instructions (Addendum)
 Inpatient Rehab Discharge Instructions  Joshua Harmon Discharge date and time: 08/21/24   Activities/Precautions/ Functional Status: Activity: no lifting, driving, or strenuous exercise for until cleared by provider  Diet: regular diet Wound Care: none needed Functional status:  ___ No restrictions     ___ Walk up steps independently _x__ 24/7 supervision/assistance   ___ Walk up steps with assistance ___ Intermittent supervision/assistance  ___ Bathe/dress independently ___ Walk with walker     __x_ Bathe/dress with assistance ___ Walk Independently    ___ Shower independently ___ Walk with assistance    __x_ Shower with assistance _x__ No alcohol     ___ Return to work/school ________  Special Instructions:    COMMUNITY REFERRALS UPON DISCHARGE:    Home Health:   PT      OT      ST         SN                Agency: [facility]      Phone: [phone] *Please expect follow-up within 2-3 business days for discharge to schedule your home visit. If you have not received follow-up, be sure to contact the site directly.*     Medical Equipment/Items Ordered: wheelchair, hospital bed, and DABSC                                                 Agency/Supplier: [employer] [phone]  GENERAL COMMUNITY RESOURCES FOR PATIENT/FAMILY:  To check the status of  transportation application by contacting-  [employer] at [phone], Option 2 or email [email or web address] [email or web address].     My questions have been answered and I understand these instructions. I will adhere to these goals and the provided educational materials after my discharge from the hospital.  Patient/Caregiver Signature _______________________________ Date __________  Clinician Signature _______________________________________ Date __________  Please bring this form and your medication list with you to all your follow-up doctor's appointments.

## 2024-08-17 NOTE — Plan of Care (Signed)
 Goals downgraded d/t earlier than expected d/c   Problem: RH Balance Goal: LTG: Patient will maintain dynamic sitting balance (OT) Description: LTG:  Patient will maintain dynamic sitting balance with assistance during activities of daily living (OT) Flowsheets (Taken 08/17/2024 1011) LTG: Pt will maintain dynamic sitting balance during ADLs with: (downgraded 9/26, SD) Contact Guard/Touching assist Goal: LTG Patient will maintain dynamic standing with ADLs (OT) Description: LTG:  Patient will maintain dynamic standing balance with assist during activities of daily living (OT)  Flowsheets (Taken 08/17/2024 1011) LTG: Pt will maintain dynamic standing balance during ADLs with: (downgraded 9/26, SD) Moderate Assistance - Patient 50 - 74%   Problem: RH Dressing Goal: LTG Patient will perform lower body dressing w/assist (OT) Description: LTG: Patient will perform lower body dressing with assist, with/without cues in positioning using equipment (OT) Flowsheets (Taken 08/17/2024 1011) LTG: Pt will perform lower body dressing with assistance level of: (downgraded 9/26, SD) Moderate Assistance - Patient 50 - 74%   Problem: RH Toileting Goal: LTG Patient will perform toileting task (3/3 steps) with assistance level (OT) Description: LTG: Patient will perform toileting task (3/3 steps) with assistance level (OT)  Flowsheets (Taken 08/17/2024 1011) LTG: Pt will perform toileting task (3/3 steps) with assistance level: (downgraded 9/26, SD) Moderate Assistance - Patient 50 - 74%   Problem: RH Awareness Goal: LTG: Patient will demonstrate awareness during functional activites type of (OT) Description: LTG: Patient will demonstrate awareness during functional activites type of (OT) Flowsheets (Taken 08/17/2024 1011) LTG: Patient will demonstrate awareness during functional activites type of (OT): (downgraded 9/26, SD) Moderate Assistance - Patient 50 - 74%

## 2024-08-17 NOTE — Progress Notes (Signed)
 Occupational Therapy Weekly Progress Note  Patient Details  Name: Joshua Harmon MRN: [id number] Date of Birth: 1961/02/13  Beginning of progress report period: August 10, 2024 End of progress report period: August 17, 2024  Today's Date: 08/17/2024 OT Individual Time: 9154-9074 OT Individual Time Calculation (min): 40 min   Patient has met 1 of 4 short term goals.  Jarvis continues to work hard in OT but is limited by ongoing severe ataxia, apraxia, L proprioception/sensory deficits, and awareness deficits. Unfortunately he will need to discharge sooner than anticipated to initiate cx tx so his goals were downgraded to min-mod A level and family education/training with [name] has been initiated. He has incredibly impaired awareness in re to his deficits and will be an extremely high fall risk at home, especially if left alone. I have several concerns with his d/c home and his SO's ability to manage his high level of needs. Anticipate he may need to be admitted during cx treatment, if this is even an option. This is an overall unfortunate situation and despite Neven's motivation to participate he has very severe deficits.   Patient continues to demonstrate the following deficits: muscle weakness and muscle joint tightness, decreased cardiorespiratoy endurance, impaired timing and sequencing, abnormal tone, unbalanced muscle activation, motor apraxia, ataxia, decreased coordination, and decreased motor planning, decreased midline orientation and decreased attention to left, decreased awareness, decreased problem solving, and decreased safety awareness, and decreased sitting balance, decreased standing balance, decreased postural control, hemiplegia, and decreased balance strategies and therefore will continue to benefit from skilled OT intervention to enhance overall performance with BADL and Reduce care partner burden.  Patient progressing toward long term goals..  Plan of care revisions:  Goals downgraded to min-mod A.  OT Short Term Goals Week 2:  OT Short Term Goal 1 (Week 2): Pt will complete LB dressing with min A OT Short Term Goal 1 - Progress (Week 2): Not met OT Short Term Goal 2 (Week 2): Pt will complete stand pivot with min A OT Short Term Goal 2 - Progress (Week 2): Not met OT Short Term Goal 3 (Week 2): Pt will complete sit > stand with min A OT Short Term Goal 3 - Progress (Week 2): Not met OT Short Term Goal 4 (Week 2): Pt will right head/trunk to midline with no more than mod cueing OT Short Term Goal 4 - Progress (Week 2): Met Week 3:  OT Short Term Goal 1 (Week 3): STG= LTG d/t ELOS  Skilled Therapeutic Interventions/Progress Updates:    Pt received supine with no c/o pain, agreeable to OT session.  Pt became emotional re d/c and the care OT has provided. He came to EOB with very poor motor planning, putting BUE on the bed rails and pulling himself in opposite directions. He was able to following mod cueing to come to EOB. He sat with min A and large posterior pelvic tilt. Mod cueing for managing RLE extension when coming to stand- min A to pivot to the R. He used the RW to complete 100 ft of functional mobility to the therapy gym with mod A to manage trunk and LUE on the RW, as well as cueing for upright posture. He completed standing level functional reaching activity with the LUE, working in a PNF D1 pattern to address bilateral integration and cross midline reaching, as well as L attention and standing tolerance. He completed multiple repetitions with mod A required at the trunk. He ended with prolonged L  attention for functional reaching with the LUE into far L quadrant of vision. He was able to maintain L attention with min cueing. He passed beanbags from L to R UE to promote bimanual integration. He returned to his room and was passed off to PT in room.   Therapy Documentation Precautions:  Precautions Precautions: Fall Recall of Precautions/Restrictions:  Impaired Precaution/Restrictions Comments: Poor awareness, L lean, ataxic Restrictions Weight Bearing Restrictions Per Provider Order: No Therapy/Group: Individual Therapy  Nena VEAR Moats 08/17/2024, 8:09 AM

## 2024-08-18 DIAGNOSIS — D496 Neoplasm of unspecified behavior of brain: Secondary | ICD-10-CM | POA: Diagnosis not present

## 2024-08-18 LAB — BASIC METABOLIC PANEL WITH GFR
Anion gap: 10 (ref 5–15)
BUN: 16 mg/dL (ref 8–23)
CO2: 25 mmol/L (ref 22–32)
Calcium: 8.7 mg/dL — ABNORMAL LOW (ref 8.9–10.3)
Chloride: 99 mmol/L (ref 98–111)
Creatinine, Ser: 0.77 mg/dL (ref 0.61–1.24)
GFR, Estimated: >60 mL/min (ref >60)
Glucose, Bld: 120 mg/dL — ABNORMAL HIGH (ref 70–99)
Potassium: 4.5 mmol/L (ref 3.5–5.1)
Sodium: 134 mmol/L — ABNORMAL LOW (ref 135–145)

## 2024-08-18 MED ORDER — MAGNESIUM GLUCONATE 500 (27 MG) MG PO TABS
250.0000 mg | ORAL_TABLET | Freq: Every day | ORAL | Status: DC
  Administered 2024-08-19 – 2024-08-21 (×3): 250 mg via ORAL
  Filled 2024-08-18 (×3): qty 1

## 2024-08-18 NOTE — Progress Notes (Signed)
 PROGRESS NOTE   Subjective/Complaints: Asks for a soda No new complaints Tachycardic and hypertensive- magnesium supplement started  ROS: Patient denies chills, rash, sore throat, blurred vision, dizziness, nausea, vomiting, diarrhea, cough, shortness of breath or chest pain, joint or back/neck pain,   or mood change.  Denies new motor or sensory changes.  Objective:   No results found.  No results for input(s): WBC, HGB, HCT, PLT in the last 72 hours.   Recent Labs    08/17/24 0550 08/18/24 0502  NA 132* 134*  K 4.5 4.5  CL 98 99  CO2 22 25  GLUCOSE 114* 120*  BUN 16 16  CREATININE 0.70 0.77  CALCIUM 8.5* 8.7*       Intake/Output Summary (Last 24 hours) at 08/18/2024 1944 Last data filed at 08/18/2024 1740 Gross per 24 hour  Intake 1116 ml  Output 300 ml  Net 816 ml        Physical Exam: Vital Signs Blood pressure (!) 125/98, pulse (!) 102, temperature 98.1 F (36.7 C), temperature source Oral, resp. rate 18, height 6' 1 (1.854 m), weight 69.5 kg, SpO2 100%.  Constitutional: No distress .  Awake, alert.  Sitting up in bedside chair. HEENT: right crani site CDI.  EOMI, oral membranes moist Neck: supple Cardiovascular: Tachycardic Respiratory/Chest: Clear to auscultation bilaterally, nonlabored breathing GI/Abdomen: BS +, non-tender, non-distended, soft Ext: no clubbing, cyanosis, or edema Psych: pleasant and cooperative  Skin: C/D/I. No apparent lesions. .  Well-approximated.  Right crani site well-approximated, healing.  Neurologic exam:    Pt alert and oriented x 3. Right gaze preference./Left hemineglect--ongoing Some moderate difficulty with attention and insight--seems to be doing better, performed several complex cognitive tasks today with some increased time   MMT: RUE and RLE grossly 5/5 prox to distal--unchanged LUE 5-/5 LUE and LLE .  --Complicated by hemineglect  Sensation  slightly altered to light touch in left upper extremity DTR's 1+ on the right, 2+ on the left Significant ataxia in left upper extremity, mild in left lower extremity, not really appreciable in right lower extremity. Tone: MAS 2 left elbow flexor, 1 shoulder adductor,1  knee flexor 1 knee extensor . Head remains preferentially position to the right, but can bring to midline    Assessment/Plan: 1. Functional deficits which require 3+ hours per day of interdisciplinary therapy in a comprehensive inpatient rehab setting. Physiatrist is providing close team supervision and 24 hour management of active medical problems listed below. Physiatrist and rehab team continue to assess barriers to discharge/monitor patient progress toward functional and medical goals  Care Tool:  Bathing    Body parts bathed by patient: Left arm, Chest, Abdomen, Front perineal area, Right upper leg, Left upper leg, Face, Right arm   Body parts bathed by helper: Right lower leg, Left lower leg Body parts n/a: Right arm   Bathing assist Assist Level: Moderate Assistance - Patient 50 - 74%     Upper Body Dressing/Undressing Upper body dressing   What is the patient wearing?: Pull over shirt    Upper body assist Assist Level: Moderate Assistance - Patient 50 - 74%    Lower Body Dressing/Undressing Lower body dressing  What is the patient wearing?: Underwear/pull up, Pants     Lower body assist Assist for lower body dressing: Moderate Assistance - Patient 50 - 74%     Toileting Toileting    Toileting assist Assist for toileting: Moderate Assistance - Patient 50 - 74%     Transfers Chair/bed transfer  Transfers assist     Chair/bed transfer assist level: Minimal Assistance - Patient > 75%     Locomotion Ambulation   Ambulation assist   Ambulation activity did not occur: Safety/medical concerns (2/2 strong posture deficit and fatigue)  Assist level: Moderate Assistance - Patient 50 -  74% Assistive device: Walker-rolling Max distance: 180'   Walk 10 feet activity   Assist  Walk 10 feet activity did not occur: Safety/medical concerns (2/2 strong posture deficit and fatigue)  Assist level: Moderate Assistance - Patient - 50 - 74% Assistive device: Walker-rolling   Walk 50 feet activity   Assist Walk 50 feet with 2 turns activity did not occur: Safety/medical concerns (2/2 strong posture deficit and fatigue)  Assist level: Moderate Assistance - Patient - 50 - 74% Assistive device: Walker-rolling    Walk 150 feet activity   Assist Walk 150 feet activity did not occur: Safety/medical concerns (2/2 strong posture deficit and fatigue)  Assist level: Moderate Assistance - Patient - 50 - 74% Assistive device: Walker-rolling    Walk 10 feet on uneven surface  activity   Assist Walk 10 feet on uneven surfaces activity did not occur: Safety/medical concerns (2/2 strong posture deficit and fatigue)         Wheelchair     Assist Is the patient using a wheelchair?: Yes Type of Wheelchair: Manual    Wheelchair assist level: Dependent - Patient 0% Max wheelchair distance: 337ft    Wheelchair 50 feet with 2 turns activity    Assist        Assist Level: Dependent - Patient 0%   Wheelchair 150 feet activity     Assist      Assist Level: Dependent - Patient 0%   Blood pressure (!) 125/98, pulse (!) 102, temperature 98.1 F (36.7 C), temperature source Oral, resp. rate 18, height 6' 1 (1.854 m), weight 69.5 kg, SpO2 100%.  Medical Problem List and Plan: 1. 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 Janjua 9/8             -patient may  shower             -ELOS/Goals: 12-14 days, supervision to min assist with self-care and mobility, mod I to supervision with cognition --  DC 9/30 to appointment  -Continue CIR therapies including PT, OT, and SLP     - 9/16: On/off confusion for night  shift; telesitter started yesterday. Poor proprioception in LUE and LLE; difficulty with L sided attention. Max A LBD, Mod-Max A transfers, making very good progress. Mod A for bed mobility and transfers, walked 50 ft with Min A x2. L ataxia. Perseverative. Mod-severe issues with memory and problem solving. L oral pocketing.    - Will need note for driving restrictions  -0-81: Dr. Eward NP will follow-up with patient , they will be meeting with him at 1 PM this coming Tuesday.  Unsure of plan at this time.  May need to facilitate early discharge if chemo/radiation therapy required urgently--discussed with family 9/19--appointment changed to 9-30 at 2 PM, with plans to discharge to appointment per Dr. Buckley  -- Teams  tomorrow a.m., will need to discuss adjustment of goals to accommodate timeline for discharge   - 9/23: Motor planning and proprioceptive deficits - gets into unsafe positions with transfers and turns. SLP L inattention limiting cog - goals Min-Mod A.   2.  Antithrombotics: -DVT/anticoagulation:  Mechanical:  Antiembolism stockings, knee (TED hose) Bilateral lower extremities Sequential compression devices, below knee Bilateral lower extremities             -antiplatelet therapy: N/A  3. Pain Management: Tylenol , Fioricet as needed  - no pain endorsed  4. Mood/Behavior/Sleep: LCSW to follow for evaluation and support when available.              -antipsychotic agents: N/A   - 9/16: Started on telesitter overnight for impulsivity; will start sleep log + trazodone  50 mg at bedtime PRN--sleep log appropriate  5. Neuropsych/cognition: This patient is not capable of making decisions on his own behalf.   - 9/16: start ritalin  5 mg BID for attention and lethargy--continue trial, arousal much improved but concentration remains difficult  9-19: Per patient's fianc, seen considerable improvements.  Ongoing severe deficits per therapy note; concurrent continue current regimen  9-22:  Remains very poor insight, no appreciable improvements.  Will DC Ritalin  for trial.  9-24: Resume Ritalin  due to apparent worse hemineglect with therapies today, worse attention--much improved 9-26  6. Skin/Wound Care: Routine pressure-relief measures             -Per neurosurgery staple removal planned for this week (POD #10 9/18)--confirmed with Dr. Rosslyn, order placed to remove staples 9-19  7. Fluids/Electrolytes/Nutrition: Monitor intake and output and routine follow-up labs in a.m.              - on regular/ thins diet             - GERD: continue PPI  8.  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 Janjua on 9/12 decrease to 2 mg q8h indefinitely until radiation treatment begins - 9-19: IDH testing unable to be performed due to insufficient sample; per Dr. Buckley, will not alter or delay treatment, will send out  9.  Impaired glucose tolerance: Secondary to steroids CBGs have been below 180 has not required coverage.              - CBG monitoring was every 4 hours now 2x daily --has been stable, continue to hold SSI   - 9/15: CBG remain between 100 and 150; can stop CBGs    10.  Constipation: pt had bm yesterday -senokot-s 1 tab at bedtime -continue miralax  prn    11.  Leukocytosis.  likely secondary to Decadron . check urine and chest x-ray today to screen for secondary causes.  Low threshold for blood cultures and broad-spectrum antibiotics if he develops vital instability.  - 9-14: Urinalysis clear, chest x-ray with no acute process.  Vitally stable, afebrile.  Trend  9/15 sl increase to 15k today. Pt afebrile, no s/s infection. UA/CXR as above  9-22: Remains slightly elevated on Decadron  2 mg every 8; afebrile, no signs of infection, monitor.  Will need to clarify if any further taper closer to [facility] continue current regiment for now given good tolerance and no  current treatment.  12.  Right neck tone/spasticity.  Start baclofen  5 mg 3 times daily.  Not currently bothersome, but want to prevent creasing and skin breakdown if possible.  -  9-14: Head positioning much better today, likely more related to hemineglect.  Move baclofen  to 3 times daily as needed.  9/15 pt fairly neutral today--left inattention is biggest factor  -18: Range of motion, inattention improving.  9-23: Starting to develop some spasticity in the left elbow.  Not using PRNs, not bothersome at this time.  Can range to neutral easily.  Continue to monitor  Continue baclofen   13.  Hyponatremia.  Mild, 133, but downtrending.  Will get blood and urine studies today to evaluate for SIADH; may also be related to ongoing glucocorticoids.  Placed fluid restriction.  - 9/24: Patient refused labs yesterday; est serum osm 278. 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 Continue fluid restriction.  Discussed this with him today  Na reviewed and is 134  LOS: 15 days A FACE TO FACE EVALUATION WAS PERFORMED  Sven SQUIBB Hiroshi Krummel 08/18/2024, 7:44 PM

## 2024-08-18 NOTE — Plan of Care (Signed)
  Problem: Consults Goal: RH GENERAL PATIENT EDUCATION Description: See Patient Education module for education specifics. Outcome: Progressing   Problem: RH SAFETY Goal: RH STG ADHERE TO SAFETY PRECAUTIONS W/ASSISTANCE/DEVICE Description: STG Adhere to Safety Precautions With supervision-min Assistance/Device. Outcome: Progressing   Problem: RH PAIN MANAGEMENT Goal: RH STG PAIN MANAGED AT OR BELOW PT'S PAIN GOAL Description: < 4 w/ prns Outcome: Progressing

## 2024-08-18 NOTE — Progress Notes (Signed)
 Physical Therapy Session Note  Patient Details  Name: Joshua Harmon MRN: [id number] Date of Birth: 1961-06-29  Today's Date: 08/18/2024 PT Individual Time: 9082-9040 PT Individual Time Calculation (min): 42 min   Today's Date: 08/18/2024 PT Individual Time: 1430-1530 PT Individual Time Calculation (min): 60 min   Short Term Goals: Week 2:  PT Short Term Goal 1 (Week 2): Pt will complete bed mobility with min A PT Short Term Goal 2 (Week 2): Pt will perform transfers with min A PT Short Term Goal 3 (Week 2): Pt will perform gait x 25' with mod+2  Skilled Therapeutic Interventions/Progress Updates:     1st Session: Pt received supine in bed and agrees to therapy. No complaint of pain. Pt performs supine to sit with bed features and cues for positioning and body mechanics. Pt does not require physical assistance but does demonstrate significantly impaired motor planning and coordination, with hips in extension for much of transfer and requiring cueing to flex at hips and knees for safety and balance. Pt performs Stedy transfer to toilet with minA for trunk stability and cues for attention to LUE. Pt has continent bowel movement and requires assistance for pericare with pt standing in stedy with cues for hand placement. Pt positioned in high perch on stedy to wash hands, working on core strength, endurance, and coordination. Stedy transfer back to [facility]. Remainder of session focsus on coordination and balance training with use of Wii bowling. Performed to work on coordination, standing balance, transfer training, and performing gross and fine motor skills in response to visual feedback. Pt requires modA for sit to stand and modA to maxA for standing balance, with very poor trunk control and motor planning. WC transport back to room. Left seated in WC with alarm intact and all needs within reach.   2nd Session: Pt received seated in [facility] and agrees to therapy. No complaint of pain> WC transport to gym  for time management. Pt performs sit to stand after multiple attempts, requiring modA and cues for anterior weight shift and body mechanics. Pt tends to have strong retropulsion and poor motor planning of transfer. Pt attempts ambulation with HHA and requires maxA due to complete LOB and difficulty with step placement, routinely crossing over with LLE, or stepping on top of Rt foot with Lt foot. Pt ambulates x15' total prior to rest break. Pt then completes alternating foot taps on 4 step with Rt HHA, requiring modA/maxA and cues for upright posture, lateral weight shifting, body mechanics. Pt completes x10 total prior to rest break. Pt transitions to supine with cues for sequencing and positioning. Pt attempts bridges in hooklying to work on coordination, Lt hemibody NMR, and core strengthening. Pt has difficulty with motor planning of bridges, over utilizing quads and with insufficient glute contraction, causing pt to slide superiorly. Pt attempts supine hip abduction and is only able to complete minimal range with LLE, demonstrating bilateral lower extremity tone, limiting movement. Pt completes SLRs and is able to complete x5 with each leg, with cues for correct performance and for NM feedback. Pt performs supine to sit with modA and cues for hip flexion and knee flexion to prevent extensor tone from causing unsafe slide toward edge of mat. Pt ambulates x15' with modA and pt utilizing object on Rt side for upper extremity support. Pt completes Nustep for reciprocal coordination training> pt completes x9:30 at workload of 7 with average steps per minute ~55. PT provides cues for hand and foot placement and completing full  available ROM. Pt performs squat pivot to the Rt to work on functional mobility training, requiring modA for sequencing. Pt left seated with all needs within reach.   Therapy Documentation Precautions:  Precautions Precautions: Fall Recall of Precautions/Restrictions:  Impaired Precaution/Restrictions Comments: Poor awareness, L lean, ataxic Restrictions Weight Bearing Restrictions Per Provider Order: No    Therapy/Group: Individual Therapy  [name], PT, DPT 08/18/2024, 3:38 PM

## 2024-08-19 DIAGNOSIS — D496 Neoplasm of unspecified behavior of brain: Secondary | ICD-10-CM | POA: Diagnosis not present

## 2024-08-19 MED ORDER — QUETIAPINE FUMARATE 25 MG PO TABS
25.0000 mg | ORAL_TABLET | Freq: Once | ORAL | Status: AC
  Administered 2024-08-19: 25 mg via ORAL
  Filled 2024-08-19: qty 1

## 2024-08-19 NOTE — Plan of Care (Signed)
  Problem: Consults Goal: RH GENERAL PATIENT EDUCATION Description: See Patient Education module for education specifics. Outcome: Progressing   Problem: RH BLADDER ELIMINATION Goal: RH STG MANAGE BLADDER WITH ASSISTANCE Description: STG Manage Bladder With supervision-min Assistance Outcome: Progressing   Problem: RH PAIN MANAGEMENT Goal: RH STG PAIN MANAGED AT OR BELOW PT'S PAIN GOAL Description: < 4 w/ prns Outcome: Progressing

## 2024-08-19 NOTE — Progress Notes (Signed)
 PROGRESS NOTE   Subjective/Complaints: No new complaints this morning In positive mood Patient's chart reviewed- No issues reported overnight Vitals signs stable   ROS: Patient denies chills, rash, sore throat, blurred vision, dizziness, nausea, vomiting, diarrhea, cough, shortness of breath or chest pain, joint or back/neck pain,   or mood change.  Denies new motor or sensory changes.  Objective:   No results found.  No results for input(s): WBC, HGB, HCT, PLT in the last 72 hours.   Recent Labs    08/17/24 0550 08/18/24 0502  NA 132* 134*  K 4.5 4.5  CL 98 99  CO2 22 25  GLUCOSE 114* 120*  BUN 16 16  CREATININE 0.70 0.77  CALCIUM 8.5* 8.7*       Intake/Output Summary (Last 24 hours) at 08/19/2024 1231 Last data filed at 08/19/2024 9071 Gross per 24 hour  Intake 1116 ml  Output 2700 ml  Net -1584 ml        Physical Exam: Vital Signs Blood pressure 138/76, pulse 79, temperature 97.7 F (36.5 C), temperature source Oral, resp. rate 18, height 6' 1 (1.854 m), weight 69.5 kg, SpO2 95%.  Constitutional: No distress .  Awake, alert.  Lying in bed comfortably HEENT: right crani site CDI.  EOMI, oral membranes moist Neck: supple Cardiovascular: Tachycardic Respiratory/Chest: Clear to auscultation bilaterally, nonlabored breathing GI/Abdomen: BS +, non-tender, non-distended, soft Ext: no clubbing, cyanosis, or edema Psych: pleasant and cooperative  Skin: C/D/I. No apparent lesions. .  Well-approximated.  Right crani site well-approximated, healing.  Neurologic exam:    Pt alert and oriented x 3. Right gaze preference./Left hemineglect--ongoing Some moderate difficulty with attention and insight--seems to be doing better, performed several complex cognitive tasks today with some increased time   MMT: RUE and RLE grossly 5/5 prox to distal--unchanged LUE 5-/5 LUE and LLE .  --Complicated by  hemineglect  Sensation slightly altered to light touch in left upper extremity DTR's 1+ on the right, 2+ on the left Significant ataxia in left upper extremity, mild in left lower extremity, not really appreciable in right lower extremity. Tone: MAS 2 left elbow flexor, 1 shoulder adductor,1  knee flexor 1 knee extensor . Head remains preferentially position to the right, but can bring to midline    Assessment/Plan: 1. Functional deficits which require 3+ hours per day of interdisciplinary therapy in a comprehensive inpatient rehab setting. Physiatrist is providing close team supervision and 24 hour management of active medical problems listed below. Physiatrist and rehab team continue to assess barriers to discharge/monitor patient progress toward functional and medical goals  Care Tool:  Bathing    Body parts bathed by patient: Left arm, Chest, Abdomen, Front perineal area, Right upper leg, Left upper leg, Face, Right arm   Body parts bathed by helper: Right lower leg, Left lower leg Body parts n/a: Right arm   Bathing assist Assist Level: Moderate Assistance - Patient 50 - 74%     Upper Body Dressing/Undressing Upper body dressing   What is the patient wearing?: Pull over shirt    Upper body assist Assist Level: Moderate Assistance - Patient 50 - 74%    Lower Body Dressing/Undressing Lower  body dressing      What is the patient wearing?: Underwear/pull up, Pants     Lower body assist Assist for lower body dressing: Moderate Assistance - Patient 50 - 74%     Toileting Toileting    Toileting assist Assist for toileting: Moderate Assistance - Patient 50 - 74%     Transfers Chair/bed transfer  Transfers assist     Chair/bed transfer assist level: Minimal Assistance - Patient > 75%     Locomotion Ambulation   Ambulation assist   Ambulation activity did not occur: Safety/medical concerns (2/2 strong posture deficit and fatigue)  Assist level: Moderate  Assistance - Patient 50 - 74% Assistive device: Walker-rolling Max distance: 180'   Walk 10 feet activity   Assist  Walk 10 feet activity did not occur: Safety/medical concerns (2/2 strong posture deficit and fatigue)  Assist level: Moderate Assistance - Patient - 50 - 74% Assistive device: Walker-rolling   Walk 50 feet activity   Assist Walk 50 feet with 2 turns activity did not occur: Safety/medical concerns (2/2 strong posture deficit and fatigue)  Assist level: Moderate Assistance - Patient - 50 - 74% Assistive device: Walker-rolling    Walk 150 feet activity   Assist Walk 150 feet activity did not occur: Safety/medical concerns (2/2 strong posture deficit and fatigue)  Assist level: Moderate Assistance - Patient - 50 - 74% Assistive device: Walker-rolling    Walk 10 feet on uneven surface  activity   Assist Walk 10 feet on uneven surfaces activity did not occur: Safety/medical concerns (2/2 strong posture deficit and fatigue)         Wheelchair     Assist Is the patient using a wheelchair?: Yes Type of Wheelchair: Manual    Wheelchair assist level: Dependent - Patient 0% Max wheelchair distance: 325ft    Wheelchair 50 feet with 2 turns activity    Assist        Assist Level: Dependent - Patient 0%   Wheelchair 150 feet activity     Assist      Assist Level: Dependent - Patient 0%   Blood pressure 138/76, pulse 79, temperature 97.7 F (36.5 C), temperature source Oral, resp. rate 18, height 6' 1 (1.854 m), weight 69.5 kg, SpO2 95%.  Medical Problem List and Plan: 1. 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 Janjua 9/8             -patient may  shower             -ELOS/Goals: 12-14 days, supervision to min assist with self-care and mobility, mod I to supervision with cognition --  DC 9/30 to appointment  -Continue CIR therapies including PT, OT, and SLP     - 9/16: On/off  confusion for night shift; telesitter started yesterday. Poor proprioception in LUE and LLE; difficulty with L sided attention. Max A LBD, Mod-Max A transfers, making very good progress. Mod A for bed mobility and transfers, walked 50 ft with Min A x2. L ataxia. Perseverative. Mod-severe issues with memory and problem solving. L oral pocketing.    - Will need note for driving restrictions  -0-81: Dr. Eward NP will follow-up with patient , they will be meeting with him at 1 PM this coming Tuesday.  Unsure of plan at this time.  May need to facilitate early discharge if chemo/radiation therapy required urgently--discussed with family 9/19--appointment changed to 9-30 at 2 PM, with plans to discharge to appointment per  Dr. Buckley  -- Teams tomorrow a.m., will need to discuss adjustment of goals to accommodate timeline for discharge   - 9/23: Motor planning and proprioceptive deficits - gets into unsafe positions with transfers and turns. SLP L inattention limiting cog - goals Min-Mod A.   2.  Antithrombotics: -DVT/anticoagulation:  Mechanical:  Antiembolism stockings, knee (TED hose) Bilateral lower extremities Sequential compression devices, below knee Bilateral lower extremities             -antiplatelet therapy: N/A  3. Pain Management: Tylenol , Fioricet as needed  - no pain endorsed  4. Mood/Behavior/Sleep: LCSW to follow for evaluation and support when available.              -antipsychotic agents: N/A   - 9/16: Started on telesitter overnight for impulsivity; will start sleep log, continue trazodone  50 mg at bedtime PRN--sleep log appropriate  5. Neuropsych/cognition: This patient is not capable of making decisions on his own behalf.   - 9/16: start ritalin  5 mg BID for attention and lethargy--continue trial, arousal much improved but concentration remains difficult  9-19: Per patient's fianc, seen considerable improvements.  Ongoing severe deficits per therapy note; concurrent continue  current regimen  9-22: Remains very poor insight, no appreciable improvements.  Will DC Ritalin  for trial.  9-24: Resume Ritalin  due to apparent worse hemineglect with therapies today, worse attention--much improved 9-26  6. Skin/Wound Care: Routine pressure-relief measures             -Per neurosurgery staple removal planned for this week (POD #10 9/18)--confirmed with Dr. Rosslyn, order placed to remove staples 9-19  7. Fluids/Electrolytes/Nutrition: Monitor intake and output and routine follow-up labs in a.m.              - on regular/ thins diet             - GERD: continue PPI  8.  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 Janjua on 9/12 decrease to 2 mg q8h indefinitely until radiation treatment begins - 9-19: IDH testing unable to be performed due to insufficient sample; per Dr. Buckley, will not alter or delay treatment, will send out  9.  Impaired glucose tolerance: Secondary to steroids CBGs have been below 180 has not required coverage.              - CBG monitoring was every 4 hours now 2x daily --has been stable, continue to hold SSI   - 9/15: CBG remain between 100 and 150; can stop CBGs    10.  Constipation: pt had bm yesterday -senokot-s 1 tab at bedtime -continue miralax  prn    11.  Leukocytosis.  likely secondary to Decadron . check urine and chest x-ray today to screen for secondary causes.  Low threshold for blood cultures and broad-spectrum antibiotics if he develops vital instability.  - 9-14: Urinalysis clear, chest x-ray with no acute process.  Vitally stable, afebrile.  Trend  9/15 sl increase to 15k today. Pt afebrile, no s/s infection. UA/CXR as above  9-22: Remains slightly elevated on Decadron  2 mg every 8; afebrile, no signs of infection, monitor.  Will need to clarify if any further taper closer to [facility] continue current regiment for now given  good tolerance and no current treatment.  12.  Right neck tone/spasticity.  Start baclofen  5 mg 3 times daily.  Not currently bothersome, but want to prevent creasing and  skin breakdown if possible.  - 9-14: Head positioning much better today, likely more related to hemineglect.  Move baclofen  to 3 times daily as needed.  9/15 pt fairly neutral today--left inattention is biggest factor  -18: Range of motion, inattention improving.  9-23: Starting to develop some spasticity in the left elbow.  Not using PRNs, not bothersome at this time.  Can range to neutral easily.  Continue to monitor  continue baclofen   13.  Hyponatremia.  Mild, 133, but downtrending.  Will get blood and urine studies today to evaluate for SIADH; may also be related to ongoing glucocorticoids.  Placed fluid restriction.  - 9/24: Patient refused labs yesterday; est serum osm 278. 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 Continue fluid restriction.  Discussed this with him today  Na reviewed and is 134  LOS: 16 days A FACE TO FACE EVALUATION WAS PERFORMED  Sven SQUIBB [address] 08/19/2024, 12:31 PM

## 2024-08-20 ENCOUNTER — Inpatient Hospital Stay (HOSPITAL_COMMUNITY)

## 2024-08-20 DIAGNOSIS — D496 Neoplasm of unspecified behavior of brain: Secondary | ICD-10-CM | POA: Diagnosis not present

## 2024-08-20 NOTE — Plan of Care (Signed)
  Problem: Consults Goal: RH GENERAL PATIENT EDUCATION Description: See Patient Education module for education specifics. Outcome: Progressing   Problem: RH BLADDER ELIMINATION Goal: RH STG MANAGE BLADDER WITH ASSISTANCE Description: STG Manage Bladder With supervision-min Assistance Outcome: Progressing   Problem: RH SAFETY Goal: RH STG ADHERE TO SAFETY PRECAUTIONS W/ASSISTANCE/DEVICE Description: STG Adhere to Safety Precautions With supervision-min Assistance/Device. Outcome: Progressing   Problem: RH PAIN MANAGEMENT Goal: RH STG PAIN MANAGED AT OR BELOW PT'S PAIN GOAL Description: < 4 w/ prns Outcome: Progressing

## 2024-08-20 NOTE — Progress Notes (Incomplete Revision)
 Patient ID: Joshua Harmon, male   DOB: [birth year], 62 y.o.   MRN: [id number]  Therapy recommends w/c and 3in1 BSC.   1103- SW called pt s/o [name] to discuss above. She inquired about a hospital bed. SW confirmed with medical team this is needed. SW shared that copay will need to be made first before DME is delivered.   SW ordered DME with [employer] via parachute.   SW went by room and met with pt and pt so [name] to provide [employer] application and encouraged to follow-up about status. SW informed on DME ordered. [name] has received a phone call but unsure if she will be getting DME due to high copay.  SW shared above about DME with medical team.   SW followed up with various [facility] and waiting on follow-up.  SW spoke with [name] to follow-up abotu referral. Reports outside of service area.   SW spoke with intake with [facility] to discuss referral and was informed Does not service area.  SW spoke with Intake with [facility] and does not service area.   1317-SW faxed referral to [facility]/Intake (p:[phone]/[phone]) and waiting on follow-up.   1602- SW followed up with [facility]/Intake in regards to referral reports SW will need to resend fax as they did not receive.   SW received the following updates from [place] with [employer]- @Joshua Harmon  [name] - I spoke with [name] [name] -  She cancelled the gel overlay and wheelchair seat cushion due to cost- She also stated she will call back regarding payment and autopay.   Declined HHAs [name]- not in network with [employer]  [name] [name], MSW, LCSW Office: [phone] Cell: [phone] Fax: [phone]

## 2024-08-20 NOTE — Progress Notes (Addendum)
 Patient ID: Joshua Harmon, male   DOB: [birth year], 63 y.o.   MRN: [id number]  Therapy recommends w/c and 3in1 BSC.   1103- SW called pt s/o [name] to discuss above. She inquired about a hospital bed. SW confirmed with medical team this is needed. SW shared that copay will need to be made first before DME is delivered.   SW ordered DME with [employer] via parachute.   SW went by room and met with pt and pt so [name] to provide [employer] application and encouraged to follow-up about status. SW informed on DME ordered. [name] has received a phone call but unsure if she will be getting DME due to high copay.  SW shared above about DME with medical team.   SW followed up with various [facility] and waiting on follow-up.  SW spoke with [name] to follow-up abotu referral. Reports outside of service area.   SW spoke with intake with [facility] to discuss referral and was informed Does not service area.  SW spoke with Intake with [facility] and does not service area.   1317-SW faxed referral to [facility]/Intake (p:[phone]/f:[phone]) and waiting on follow-up.   1602- SW followed up with [facility]/Intake in regards to referral reports SW will need to resend fax as they did not receive.   SW received the following updates from [place] with [employer]- @[name]  [name] - I spoke with [name] [name] -  She cancelled the gel overlay and wheelchair seat cushion due to cost- She also stated she will call back regarding payment and autopay.   Declined HHAs [name]- not in network with [employer] [name]  [name] [name], MSW, LCSW Office: [phone] Cell: [phone] Fax: [phone]

## 2024-08-20 NOTE — Progress Notes (Signed)
 Occupational Therapy Discharge Summary  Patient Details  Name: Joshua Harmon MRN: [id number] Date of Birth: 1961/08/18  Date of Discharge from OT service:August 20, 2024  Today's Date: 08/20/2024 OT Individual Time: 9154-9042 OT Individual Time Calculation (min): 72 min    Patient has met 10 of 11 long term goals due to improved activity tolerance, improved balance, postural control, ability to compensate for deficits, functional use of  LEFT upper and LEFT lower extremity, improved attention, improved awareness, and improved coordination.  Patient to discharge at overall Mod Assist level.  Patient's care partner is independent to provide the necessary physical and cognitive assistance at discharge.   [name] continues to work hard in OT but is limited by ongoing severe ataxia, apraxia, L proprioception/sensory deficits, and awareness deficits. Unfortunately he will need to discharge sooner than anticipated to initiate cx tx so his goals were downgraded to min-mod A level.. He has incredibly impaired awareness in re to his deficits and will be an extremely high fall risk at home, especially if left alone. I have several concerns with his d/c home and his SO's ability to manage his high level of needs. Anticipate he may need to be admitted during cx treatment, if this is even an option. This is an overall unfortunate situation and despite [name]'s motivation to participate he has very severe deficits.   Reasons goals not met: Pt did not meet his awareness goal. His awareness is still severely impaired and he consistently overestimates his ability to perform transfers and ADLs.   Recommendation:  Patient will benefit from ongoing skilled OT services in home health setting to continue to advance functional skills in the area of BADL and iADL.  Equipment: BSC  Reasons for discharge: change in medical status, discharge from hospital, and need to begin oncology treatment  Patient/family agrees  with progress made and goals achieved: Yes  Skilled OT intervention  Pt received supine with no c/o pain, agreeable to OT session starting with shower. His SO was in room, offered more family education and she declined. He completed bed mobility to EOB with (S) using bed rail. He completed ambulatory transfer into the shower with B HHA, mod A overall. He requires manual facilitation for LUE placement and cueing for L attention and righting of head and trunk to midline. He transferred into shower and required cueing several times to scoot backward d/t being dangerously close to the edge. He completed UB bathing with (S)- good spontaneous use of the LUE during bathing. LB bathing in standing with use of the grab bar with min A for trunk control. He transferred back to EOB following. Shirt donned with min A for threading LUE into shirt and orienting shirt. He donned pants with mod A - dressing apraxia limiting- assist to thread pants and he was able to pull up in standing. He completed a stand pivot to the w/c toward the R wth mod cueing for placement of the RUE and only CGA. Pt was taken via w/c to the ADL apt for time management. He completed standing level functional reaching task into the cabinets with his LUE to address L coordination and trunk control/righting. He required intermittent proximal support at the LUE but was overall able to maintain grasp on various functional items and place these items into the cabinet at eye level or above. He completed multiple repetitions with mod facilitation at the trunk and LUE. He was then taken to the BITS where he completed visual pursuit task, with the LUE  to address coordination and NMR. He had near constant over/undershooting with 22% average accuracy over two 3 min trials. He returned to his room following. Pt was left sitting up in the wheelchair with all needs met, chair alarm set, and call bell within reach.   OT Discharge Precautions/Restrictions   Precautions Precautions: Fall Precaution/Restrictions Comments: Poor awareness, L lean, ataxic Restrictions Weight Bearing Restrictions Per Provider Order: No    ADL Eating: Supervision/safety Where Assessed-Eating: Chair Grooming: Supervision/safety Where Assessed-Grooming: Sitting at sink Upper Body Bathing: Supervision/safety Where Assessed-Upper Body Bathing: Shower Lower Body Bathing: Minimal assistance Where Assessed-Lower Body Bathing: Shower Upper Body Dressing: Minimal assistance Where Assessed-Upper Body Dressing: Sitting at sink Lower Body Dressing: Moderate assistance Where Assessed-Lower Body Dressing: Sitting at sink Toileting: Moderate assistance Where Assessed-Toileting: Toilet, Psychiatrist Transfer: Minimal Dentist Method: Surveyor, minerals: Gaffer: Maximal assistance Tub/Shower Transfer Method: Engineer, technical sales: Insurance underwriter: Moderate assistance Film/video editor Method: Information systems manager with back Vision Baseline Vision/History: 0 No visual deficits Patient Visual Report: Blurring of vision Vision Assessment?: Yes Eye Alignment: Within Functional Limits Ocular Range of Motion: Within Functional Limits Alignment/Gaze Preference: Head tilt;Head turned Tracking/Visual Pursuits: Decreased smoothness of horizontal tracking Saccades: Within functional limits Convergence: Within functional limits Visual Fields: No apparent deficits Perception  Perception: Impaired Perception-Other Comments: L inattention wth very poor motor planning Praxis Praxis: Impaired Praxis Impairment Details: Limb apraxia;Ideomotor;Ideation;Motor planning;Organization Cognition Cognition Overall Cognitive Status: Impaired/Different from baseline Arousal/Alertness: Awake/alert Orientation Level:  Person;Place;Situation Person: Oriented Place: Oriented Situation: Oriented Memory: Appears intact Selective Attention: Impaired Selective Attention Impairment: Verbal basic;Functional basic Awareness: Impaired Awareness Impairment: Intellectual impairment Problem Solving: Impaired Problem Solving Impairment: Verbal basic;Functional basic Organizing: Impaired Organizing Impairment: Verbal basic;Functional basic Self Monitoring: Impaired Self Monitoring Impairment: Verbal basic;Functional basic Self Correcting: Impaired Self Correcting Impairment: Verbal basic;Functional basic Behaviors: Poor frustration tolerance Safety/Judgment: Impaired Comments: Pt continues to have severe awareness impairments and is a very high fall risk d/ t this Brief Interview for Mental Status (BIMS) Repetition of Three Words (First Attempt): 3 Temporal Orientation: Year: Correct Temporal Orientation: Month: Accurate within 5 days Temporal Orientation: Day: Correct Recall: Sock: Yes, no cue required Recall: Blue: Yes, no cue required Recall: Bed: Yes, no cue required BIMS Summary Score: 15 Sensation Sensation Light Touch: Impaired Detail Light Touch Impaired Details: Impaired LUE;Impaired LLE Proprioception: Impaired Detail Proprioception Impaired Details: Impaired LUE;Impaired RLE;Impaired LLE Coordination Gross Motor Movements are Fluid and Coordinated: No Fine Motor Movements are Fluid and Coordinated: No Coordination and Movement Description: Severe ataxia, extensor tone in the BLE at times, poor trunk control Motor  Motor Motor: Ataxia;Hemiplegia Motor - Skilled Clinical Observations: Ataxia, L hemi Mobility  Bed Mobility Bed Mobility: Supine to Sit;Sit to Supine Supine to Sit: Supervision/Verbal cueing Sit to Supine: Supervision/Verbal cueing Transfers Sit to Stand: Minimal Assistance - Patient > 75% Stand to Sit: Minimal Assistance - Patient > 75%  Trunk/Postural Assessment   Cervical Assessment Cervical Assessment: Exceptions to [facility] (large head turn R) Thoracic Assessment Thoracic Assessment: Within Functional Limits Lumbar Assessment Lumbar Assessment: Exceptions to [facility] (large posterior pelvic tilt) Postural Control Postural Control: Deficits on evaluation Trunk Control: decreased, poor proprioception and ataxia. Sits in large posterior tilt to compensate Righting Reactions: delayed and inadequate  Balance Balance Balance Assessed: Yes Static Sitting Balance Static Sitting - Balance Support: Feet supported Static Sitting - Level of Assistance: 5: Stand by assistance Dynamic  Sitting Balance Dynamic Sitting - Balance Support: Feet supported Dynamic Sitting - Level of Assistance: 5: Stand by assistance;4: Min assist Static Standing Balance Static Standing - Balance Support: Right upper extremity supported Static Standing - Level of Assistance: 4: Min assist Static Standing - Comment/# of Minutes: With steady UE support (counter or grab bar) he is CGA, with HHA min-mod A Dynamic Standing Balance Dynamic Standing - Balance Support: Right upper extremity supported Dynamic Standing - Level of Assistance: 3: Mod assist Extremity/Trunk Assessment RUE Assessment RUE Assessment: Within Functional Limits LUE Assessment LUE Assessment: Exceptions to [facility] Active Range of Motion (AROM) Comments: Full AROM General Strength Comments: Tone improved, limb remains with impaired sensation,ataxic, and poor proprioception   Nena VEAR Moats 08/20/2024, 10:40 AM

## 2024-08-20 NOTE — Progress Notes (Signed)
 Physical Therapy Session Note  Patient Details  Name: Joshua Harmon MRN: [id number] Date of Birth: [birth year]  Today's Date: 08/20/2024 PT Individual Time: 8581-8467 PT Individual Time Calculation (min): 74 min   Short Term Goals: Week 2:  PT Short Term Goal 1 (Week 2): Pt will complete bed mobility with min A PT Short Term Goal 2 (Week 2): Pt will perform transfers with min A PT Short Term Goal 3 (Week 2): Pt will perform gait x 25' with mod+2  Skilled Therapeutic Interventions/Progress Updates:     Pt received seated in [facility] and agrees to therapy. No complaint of pain. Pt's girlfriend Andres present for family education. WC transport to gym for time management.  To improve coordination and muscle activation, NMR performed w/ seated heel slides w/ pillow cases, seated SAQs. Pt required tactile cues and AAROM on several repetitions of heel slides due to weakness/poor activation of L knee flexors. R knee flexors notably weak, but no tactile cue to achieve ROM for exercise. Pt also performed standing marching to improve reciprocal activation for carryover into gait. Given HHA +2 for safety. Pt ambulated around nurses station w/ same assist, demonstrating improved step length B and upright posture. W/ fatigue, pt gait mechanics declined into scissoring, inconsistent step length/foot placement, and trunk leans.   Pt then transported via [facility] outside for car transfer training in pt's personal [employer]. Pt attempts transfer the way [I] always do it, and is unable to successfully transfer into car, despite attempting for >10 minutes. PT provides maxA throughout for safety and to prevent fall, and attempting to cue pt, but with pt demonstrating minimal receptiveness to feedback or cueing. PT utilizes calming techniques and eventually is able to communicate effectively with pt, and pt completes transfer with maxA overall, with PT providing heavy assistance for safety, sequencing, and management of  BLEs L>R. Pt tends to exhibit significant extensor tone and impaired motor planning, impairing safety and ability to perform successful transfers. Pt's girlfriend Andres provides assistance for pt's to transfer out of car, and then attempts to assist pt back into car, but pt is unable to complete, again attempting premorbid strategies and techniques rather than listening to therapist or girlfriend. PT replaces girlfriend an pt is eventually abl eto complete additional transfer with maxA. Pt exhibits significant frustration with therapist and family members, and says that he does not need our help. PT allows pt to express frustration and provides gentle calming techniques during WC transport back inside. PT also educates girlfriend and pt's daughter on need for a shorter car and more assistance during transfer due to safety concerns. Pt left seated with all needs within reach   Therapy Documentation Precautions:  Precautions Precautions: Fall Recall of Precautions/Restrictions: Impaired Precaution/Restrictions Comments: Poor awareness, L lean, ataxic Restrictions Weight Bearing Restrictions Per Provider Order: No    Therapy/Group: Individual Therapy  [name], PT, DPT 08/20/2024, 4:25 PM

## 2024-08-20 NOTE — Progress Notes (Signed)
 Nurse called Therisa PEAK, at 23:04 reporting a unwitnessed fall that occurred at 21:30, patient was found laying on the floor in the bathroom.  Nurse will continue to assess and order given  CT without contrast, spoke with charge nurse Randine RN, she will follow up on the above events that occurred. She verbalizes understanding.

## 2024-08-20 NOTE — Progress Notes (Addendum)
 Speech Language Pathology Daily Session Note  Patient Details  Name: Joshua Harmon MRN: [id number] Date of Birth: 1961/08/18  Today's Date: 08/20/2024 SLP Individual Time: 1000-1100 SLP Individual Time Calculation (min): 60 min  Short Term Goals: Week 3: SLP Short Term Goal 1 (Week 3): STGs = LTGs d/t ELOS  Skilled Therapeutic Interventions:   SLP conducted skilled therapy session targeting L visual inattention and cognition goals. SLP utilized mildly complex grocery list task to target L attention, visual scanning, and short term/working memory. Patient required mod cues and increased processing time to locate items in left visual field. Additionally, he required min to mod cues for immediate and >15sec interval recall of items. SLP facilitated mildly complex task to target working memory, problem solving, and sustained attention. Patient required min to mod cues for working memory and problem solving during functional monetary task. He required increased processing time to calculate bill/coin amounts. Patient min to mod for sustained attention. Sustained attention decreased as environmental distracters increased. Patient then completed mild to moderately complex task to target problem solving and visuospatial construction ability. Patient required min to mod cues as task complexity increased. Patient was left in room with call bell in reach and alarm set. See d/c summary for more info.   Pain   None  Therapy/Group: Individual Therapy  Dozier Alken 08/20/2024, 12:39 PM

## 2024-08-20 NOTE — Progress Notes (Incomplete)
 Inpatient Rehabilitation Discharge Medication Review by a Pharmacist  A complete drug regimen review was completed for this patient to identify any potential clinically significant medication issues.  High Risk Drug Classes Is patient taking? Indication by Medication  Antipsychotic No   Anticoagulant No   Antibiotic No   Opioid No   Antiplatelet No   Hypoglycemics/insulin  No   Vasoactive Medication No   Chemotherapy No   Other Yes Baclofen  - spasticity Dexamethasone  - cerebral edema due to brain mass Magonate - supplement Methylphenidate  - attention/ lethargy/ initiation Pantoprazole  - Reflux  Senna-docusate - laxative  PRNs: Acetaminophen  - mild pain, fever >/= 101 F Fioricet - headache, moderate pain Trazodone  - sleep     Type of Medication Issue Identified Description of Issue Recommendation(s)  Drug Interaction(s) (clinically significant)     Duplicate Therapy     Allergy     No Medication Administration End Date     Incorrect Dose     Additional Drug Therapy Needed     Significant med changes from prior encounter (inform family/care partners about these prior to discharge). No meds prior to admit to inpatient until; all are new. Communicate changes with patient/family prior to discharge.  Other       Clinically significant medication issues were identified that warrant physician communication and completion of prescribed/recommended actions by midnight of the next day:  No  Name of provider notified for urgent issues identified:   Provider Method of Notification:   Pharmacist comments:  - no steroid taper until radiation treatments begin per neurosurgery  - will follow up discharge med list  Time spent performing this drug regimen review (minutes):  15 minutes  Joshua Harmon, RPh 08/20/2024 4:14 PM

## 2024-08-20 NOTE — Progress Notes (Signed)
 PROGRESS NOTE   Subjective/Complaints: No events overnight.  No acute complaints.  Laying in bed, looking forward to discharge tomorrow. Vitals stable.  No labs this a.m.    08/20/2024    9:24 PM 08/20/2024    7:41 PM 08/20/2024    2:00 PM  Vitals with BMI  Systolic 130 130 871  Diastolic 99 99 102  Pulse 85 100 105    P.o. intakes appropriate  Continent of bladder  Last BM 9-28  ROS: Patient denies chills, rash, sore throat, blurred vision, dizziness, nausea, vomiting, diarrhea, cough, shortness of breath or chest pain, joint or back/neck pain,   or mood change.  Denies new motor or sensory changes.  Objective:   No results found.  No results for input(s): WBC, HGB, HCT, PLT in the last 72 hours.   Recent Labs    08/18/24 0502  NA 134*  K 4.5  CL 99  CO2 25  GLUCOSE 120*  BUN 16  CREATININE 0.77  CALCIUM 8.7*       Intake/Output Summary (Last 24 hours) at 08/20/2024 2254 Last data filed at 08/20/2024 1902 Gross per 24 hour  Intake 708 ml  Output 700 ml  Net 8 ml        Physical Exam: Vital Signs Blood pressure (!) 130/99, pulse 85, temperature 98.6 F (37 C), temperature source Oral, resp. rate 17, height 6' 1 (1.854 m), weight 69.5 kg, SpO2 95%.  Constitutional: No distress .  Awake, alert.  Reclining in bed. HEENT: right crani site CDI.  EOMI, oral membranes moist Neck: Some right sided tone, mostly supple,.  No JVD. Cardiovascular: Tachycardic, no murmurs rubs or gallops. Respiratory/Chest: Clear to auscultation bilaterally, nonlabored breathing GI/Abdomen: BS +, non-tender, non-distended, soft Ext: no clubbing, cyanosis, or edema Psych: pleasant and cooperative  Skin: C/D/I. No apparent lesions. .  Well-approximated.  Right crani site well-approximated, healing.  Neurologic exam:   Pt alert and oriented x 3. Right gaze preference./Left hemineglect--ongoing Some moderate  difficulty with attention and insight-continues to improve  MMT: RUE and RLE grossly 5/5 prox to distal--unchanged LUE 5-/5 LUE and LLE .  --Complicated by hemineglect  Sensation slightly altered to light touch in left upper extremity DTR's 1+ on the right, 2+ on the left Significant ataxia in left upper extremity, mild in left lower and right lower extremity  tone: MAS 2 left elbow flexor, 1 shoulder adductor,1  knee flexor 1 knee extensor .--Unchanged Head remains preferentially position to the right, but can bring to midline    Assessment/Plan: 1. Functional deficits which require 3+ hours per day of interdisciplinary therapy in a comprehensive inpatient rehab setting. Physiatrist is providing close team supervision and 24 hour management of active medical problems listed below. Physiatrist and rehab team continue to assess barriers to discharge/monitor patient progress toward functional and medical goals  Care Tool:  Bathing    Body parts bathed by patient: Left arm, Chest, Abdomen, Front perineal area, Right upper leg, Face, Right arm, Buttocks, Left lower leg, Right lower leg, Left upper leg   Body parts bathed by helper: Right lower leg, Left lower leg Body parts n/a: Right arm   Bathing assist  Assist Level: Minimal Assistance - Patient > 75%     Upper Body Dressing/Undressing Upper body dressing   What is the patient wearing?: Pull over shirt    Upper body assist Assist Level: Minimal Assistance - Patient > 75%    Lower Body Dressing/Undressing Lower body dressing      What is the patient wearing?: Underwear/pull up, Pants     Lower body assist Assist for lower body dressing: Moderate Assistance - Patient 50 - 74%     Toileting Toileting    Toileting assist Assist for toileting: Moderate Assistance - Patient 50 - 74%     Transfers Chair/bed transfer  Transfers assist     Chair/bed transfer assist level: Moderate Assistance - Patient 50 - 74%      Locomotion Ambulation   Ambulation assist   Ambulation activity did not occur: Safety/medical concerns (2/2 strong posture deficit and fatigue)  Assist level: Moderate Assistance - Patient 50 - 74% Assistive device: Walker-rolling Max distance: 180'   Walk 10 feet activity   Assist  Walk 10 feet activity did not occur: Safety/medical concerns (2/2 strong posture deficit and fatigue)  Assist level: Moderate Assistance - Patient - 50 - 74% Assistive device: Walker-rolling   Walk 50 feet activity   Assist Walk 50 feet with 2 turns activity did not occur: Safety/medical concerns (2/2 strong posture deficit and fatigue)  Assist level: Moderate Assistance - Patient - 50 - 74% Assistive device: Walker-rolling    Walk 150 feet activity   Assist Walk 150 feet activity did not occur: Safety/medical concerns (2/2 strong posture deficit and fatigue)  Assist level: Moderate Assistance - Patient - 50 - 74% Assistive device: Walker-rolling    Walk 10 feet on uneven surface  activity   Assist Walk 10 feet on uneven surfaces activity did not occur: Safety/medical concerns (2/2 strong posture deficit and fatigue)         Wheelchair     Assist Is the patient using a wheelchair?: Yes Type of Wheelchair: Manual    Wheelchair assist level: Dependent - Patient 0% Max wheelchair distance: 362ft    Wheelchair 50 feet with 2 turns activity    Assist        Assist Level: Dependent - Patient 0%   Wheelchair 150 feet activity     Assist      Assist Level: Dependent - Patient 0%   Blood pressure (!) 130/99, pulse 85, temperature 98.6 F (37 C), temperature source Oral, resp. rate 17, height 6' 1 (1.854 m), weight 69.5 kg, SpO2 95%.  Medical Problem List and Plan: 1. 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 Janjua 9/8             -patient may  shower             -ELOS/Goals: 12-14 days, supervision to  min assist with self-care and mobility, mod I to supervision with cognition --  DC 9/30 to appointment  -Continue CIR therapies including PT, OT, and SLP     - 9/16: On/off confusion for night shift; telesitter started yesterday. Poor proprioception in LUE and LLE; difficulty with L sided attention. Max A LBD, Mod-Max A transfers, making very good progress. Mod A for bed mobility and transfers, walked 50 ft with Min A x2. L ataxia. Perseverative. Mod-severe issues with memory and problem solving. L oral pocketing.    - Will need note for driving restrictions  -0-81: Dr. Eward NP will  follow-up with patient , they will be meeting with him at 1 PM this coming Tuesday.  Unsure of plan at this time.  May need to facilitate early discharge if chemo/radiation therapy required urgently--discussed with family 9/19--appointment changed to 9-30 at 2 PM, with plans to discharge to appointment per Dr. Buckley  -- Teams tomorrow a.m., will need to discuss adjustment of goals to accommodate timeline for discharge   - 9/23: Motor planning and proprioceptive deficits - gets into unsafe positions with transfers and turns. SLP L inattention limiting cog - goals Min-Mod A.   9-29: Continues to be very impulsive and resistant to adaptive strategies; attempted car transfer with family today which went very poorly.  Team to discuss with girlfriend in AM, very concerned for continued unsafe practices at discharge.  However, per Dr. Buckley, wound continues to decline without cancer treatment and thus extended rehab stay is not in the patient's best interest.    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  This makes him a very high risk for falls, especially from low surfaces which require max assist for him to power up to stand.  Patient would benefit greatly from a  hospital bed to allow for bed elevation for raising to a standing position, in addition to side rails to prevent him from impulsively falling out of bed at nighttime.  2.  Antithrombotics: -DVT/anticoagulation:  Mechanical:  Antiembolism stockings, knee (TED hose) Bilateral lower extremities Sequential compression devices, below knee Bilateral lower extremities             -antiplatelet therapy: N/A  3. Pain Management: Tylenol , Fioricet as needed  - no pain endorsed  4. Mood/Behavior/Sleep: LCSW to follow for evaluation and support when available.              -antipsychotic agents: N/A   - 9/16: Started on telesitter overnight for impulsivity; will start sleep log, continue trazodone  50 mg at bedtime PRN--sleep log appropriate  5. Neuropsych/cognition: This patient is not capable of making decisions on his own behalf.   - 9/16: start ritalin  5 mg BID for attention and lethargy--continue trial, arousal much improved but concentration remains difficult  9-19: Per patient's fianc, seen considerable improvements.  Ongoing severe deficits per therapy note; concurrent continue current regimen  9-22: Remains very poor insight, no appreciable improvements.  Will DC Ritalin  for trial.  9-24: Resume Ritalin  due to apparent worse hemineglect with therapies today, worse attention--much improved 9-26  9-29: Patient remains very impulsive, frustrated.  Cognitively is improving, seems partially from for poor insight but also partially from personality.  Will be challenging for home discharge, discussed with patient and family in AM.  6. Skin/Wound Care: Routine pressure-relief measures             -Per neurosurgery staple removal planned for this week (POD #10 9/18)--confirmed with Dr. Rosslyn, order placed to remove staples 9-19  7. Fluids/Electrolytes/Nutrition: Monitor intake and output and routine follow-up labs in a.m.              - on regular/ thins diet             - GERD: continue PPI  8.   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 Janjua on 9/12 decrease  to 2 mg q8h indefinitely until radiation treatment begins - 9-19: IDH testing unable to be performed due to insufficient sample; per Dr. Buckley, will not alter or delay treatment, will send out  9.  Impaired glucose tolerance: Secondary to steroids CBGs have been below 180 has not required coverage.              - CBG monitoring was every 4 hours now 2x daily --has been stable, continue to hold SSI   - 9/15: CBG remain between 100 and 150; can stop CBGs    10.  Constipation: pt had bm yesterday -senokot-s 1 tab at bedtime -continue miralax  prn Last bowel movement 9-28    11.  Leukocytosis.  likely secondary to Decadron . check urine and chest x-ray today to screen for secondary causes.  Low threshold for blood cultures and broad-spectrum antibiotics if he develops vital instability.  - 9-14: Urinalysis clear, chest x-ray with no acute process.  Vitally stable, afebrile.  Trend  9/15 sl increase to 15k today. Pt afebrile, no s/s infection. UA/CXR as above  9-22: Remains slightly elevated on Decadron  2 mg every 8; afebrile, no signs of infection, monitor.  Further taper per oncology Dr. Buckley.  12.  Right neck tone/spasticity.  Start baclofen  5 mg 3 times daily.  Not currently bothersome, but want to prevent creasing and skin breakdown if possible.  - 9-14: Head positioning much better today, likely more related to hemineglect.  Move baclofen  to 3 times daily as needed.  9/15 pt fairly neutral today--left inattention is biggest factor  -18: Range of motion, inattention improving.  9-23: Starting to develop some spasticity in the left elbow.  Not using PRNs, not bothersome at this time.  Can range to neutral easily.  Continue to monitor  continue baclofen --does seem to have mild improvement in tone  13.  Hyponatremia.  Mild,  133, but downtrending.  Will get blood and urine studies today to evaluate for SIADH; may also be related to ongoing glucocorticoids.  Placed fluid restriction.  - 9/24: Patient refused labs yesterday; est serum osm 278. 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 Continue fluid restriction.  Discussed this with him today  Na reviewed and is 134--improved, continue to monitor on discharge  LOS: 17 days A FACE TO FACE EVALUATION WAS PERFORMED  Joesph JAYSON Likes 08/20/2024, 10:54 PM

## 2024-08-21 ENCOUNTER — Inpatient Hospital Stay (HOSPITAL_COMMUNITY)

## 2024-08-21 ENCOUNTER — Telehealth: Payer: Self-pay

## 2024-08-21 ENCOUNTER — Other Ambulatory Visit (HOSPITAL_COMMUNITY): Payer: Self-pay

## 2024-08-21 ENCOUNTER — Inpatient Hospital Stay: Attending: Internal Medicine | Admitting: Internal Medicine

## 2024-08-21 ENCOUNTER — Telehealth: Payer: Self-pay | Admitting: Pharmacist

## 2024-08-21 ENCOUNTER — Telehealth: Payer: Self-pay | Admitting: *Deleted

## 2024-08-21 VITALS — BP 117/87 | HR 95 | Temp 98.4°F | Resp 18

## 2024-08-21 DIAGNOSIS — C719 Malignant neoplasm of brain, unspecified: Secondary | ICD-10-CM

## 2024-08-21 DIAGNOSIS — C711 Malignant neoplasm of frontal lobe: Secondary | ICD-10-CM | POA: Insufficient documentation

## 2024-08-21 MED ORDER — BUTALBITAL-APAP-CAFFEINE 50-325-40 MG PO TABS
2.0000 | ORAL_TABLET | ORAL | 0 refills | Status: DC | PRN
  Filled 2024-08-21: qty 14, 2d supply, fill #0

## 2024-08-21 MED ORDER — ACETAMINOPHEN 325 MG PO TABS: 650.0000 mg | ORAL_TABLET | Freq: Four times a day (QID) | ORAL | Status: AC | PRN

## 2024-08-21 MED ORDER — TEMOZOLOMIDE 140 MG PO CAPS: 75.0000 mg/m2/d | ORAL_CAPSULE | Freq: Every day | ORAL | 0 refills | Status: DC

## 2024-08-21 MED ORDER — TRAZODONE HCL 50 MG PO TABS
50.0000 mg | ORAL_TABLET | Freq: Every evening | ORAL | 0 refills | Status: AC | PRN
  Filled 2024-08-21: qty 30, 30d supply, fill #0

## 2024-08-21 MED ORDER — SENNOSIDES-DOCUSATE SODIUM 8.6-50 MG PO TABS
1.0000 | ORAL_TABLET | Freq: Every evening | ORAL | 0 refills | Status: AC | PRN
  Filled 2024-08-21: qty 30, 30d supply, fill #0

## 2024-08-21 MED ORDER — ONDANSETRON HCL 8 MG PO TABS
8.0000 mg | ORAL_TABLET | Freq: Three times a day (TID) | ORAL | 1 refills | Status: DC | PRN
  Filled 2024-08-21: qty 30, 10d supply, fill #0

## 2024-08-21 MED ORDER — MAGNESIUM GLUCONATE 500 (27 MG) MG PO TABS
250.0000 mg | ORAL_TABLET | Freq: Every day | ORAL | 0 refills | Status: DC
  Filled 2024-08-21: qty 30, 60d supply, fill #0

## 2024-08-21 MED ORDER — PANTOPRAZOLE SODIUM 40 MG PO TBEC
40.0000 mg | DELAYED_RELEASE_TABLET | Freq: Every day | ORAL | 0 refills | Status: DC
  Filled 2024-08-21: qty 30, 30d supply, fill #0

## 2024-08-21 MED ORDER — DEXAMETHASONE 2 MG PO TABS: 2.0000 mg | ORAL_TABLET | Freq: Two times a day (BID) | ORAL | Status: DC

## 2024-08-21 MED ORDER — BACLOFEN 5 MG PO TABS
5.0000 mg | ORAL_TABLET | Freq: Two times a day (BID) | ORAL | 0 refills | Status: DC
  Filled 2024-08-21: qty 60, 30d supply, fill #0

## 2024-08-21 MED ORDER — DEXAMETHASONE 4 MG PO TABS: 2.0000 mg | ORAL_TABLET | Freq: Three times a day (TID) | ORAL | 0 refills | Status: DC

## 2024-08-21 MED ORDER — METHYLPHENIDATE HCL 5 MG PO TABS
5.0000 mg | ORAL_TABLET | Freq: Two times a day (BID) | ORAL | 0 refills | Status: DC
  Filled 2024-08-21: qty 60, 30d supply, fill #0

## 2024-08-21 MED ORDER — POLYETHYLENE GLYCOL 3350 17 GM/SCOOP PO POWD
17.0000 g | Freq: Every day | ORAL | 0 refills | Status: AC | PRN
  Filled 2024-08-21: qty 238, 14d supply, fill #0

## 2024-08-21 NOTE — Telephone Encounter (Signed)
 Molecular Profiling Requisition faxed to [facility], [phone].  Fax confirmation received.

## 2024-08-21 NOTE — Telephone Encounter (Addendum)
 Oral Oncology Pharmacist Encounter  Received new prescription for Temodar (temozolomide) for the treatment of glioblastoma in conjunction with radiation, planned duration 42 days.  CBC w/ Diff and CMP from 08/13/24 assessed, no relevant lab abnormalities requiring baseline dose adjustment required at this time. Prescription dose and frequency assessed for appropriateness.  Current medication list in Epic reviewed, no relevant/significant DDIs with Temodar identified.  Evaluated chart and no patient barriers to medication adherence noted.   Patient agreement for treatment documented in MD note on 08/21/24.  Patient's insurance requires temozolomide be filled through [facility] - prescription has been redirected for dispensing.   Oral Oncology Clinic will continue to follow for insurance authorization, copayment issues, initial counseling and start date.  Joshua Harmon, PharmD, BCPS, BCOP Hematology/Oncology Clinical Pharmacist [phone] 08/21/2024 4:06 PM

## 2024-08-21 NOTE — Progress Notes (Addendum)
 Speech Language Pathology Discharge Summary  Patient Details  Name: Joshua Harmon MRN: [id number] Date of Birth: 03-10-1961  Date of Discharge from SLP service:August 20, 2024  Patient has met 3 of 3 long term goals.  Patient to discharge at overall Mod;Max level.  Reasons goals not met: n/a   Clinical Impression/Discharge Summary:  Slow progress noted this stay given severity of deficits, lack of insight, and mental flexibility deficits. He continues to present w/ moderate problem solving, recall, and attention deficits. Profound mental flexibility and awareness deficits remain. These will continue to negatively impact his safety in the home environment and further limit his overall progress. Pt/family education complete, including restrictions upon d/c and safety concerns re to cognition. He will require 24/7 supervision upon d/c and would ideally benefit from SNF dicharge vs home given severity of deficits, however, pt/family requesting home d/c at this time. Recommend continued ST upon d/c to target cognition, maximize pt safety/independence, and reduce caregiver burden.    Care Partner:  Caregiver Able to Provide Assistance: Yes  Type of Caregiver Assistance: Physical;Cognitive  Recommendation:  24 hour supervision/assistance;Home Health SLP  Rationale for SLP Follow Up: Maximize cognitive function and independence;Reduce caregiver burden   Equipment: none   Reasons for discharge: Discharged from hospital   Patient/Family Agrees with Progress Made and Goals Achieved: Yes    Joshua Harmon 08/21/2024, 7:56 AM

## 2024-08-21 NOTE — Progress Notes (Signed)
 Physical Therapy Discharge Summary  Patient Details  Name: Joshua Harmon MRN: [id number] Date of Birth: June 11, 1961  Date of Discharge from PT service:August 21, 2024  Patient has met 0 of 7 long term goals due to increasing level of assistance required for functional mobility, with increased ataxia and motor planning impairment in Lt hemibody.  Patient to discharge at an ambulatory level ModA +2.  Patient's care partner requires assistance to provide the necessary physical and cognitive assistance at discharge. Pt discharging earlier than originally planned due to initiation of chemo treatment, and PT does not feel that pt is at a safe level for return home, instead recommending that pt continue with physical therapy in acute setting, if possible.    Reasons goals not met: Pt made progress toward functional mobility goals during first reporting period, but has had decline in overall mobility as DC has approached, with increasing ataxia and motor planning impairment in Lt hemibody, decreased safety awareness, and increased resistance to intervention.   Recommendation:  Patient will benefit from ongoing skilled PT services in acute, inpatient, or subacute setting to continue to advance safe functional mobility, address ongoing impairments in Lt hemibody coordination and motor planning with resulting deficits in bed mobility, balance, transfers, and minimize fall risk.  Equipment: 18 x 18 manual WC  Reasons for discharge: discharge from hospital  Patient/family agrees with progress made and goals achieved: Yes  PT Discharge Precautions/Restrictions Precautions Precautions: Fall Precaution/Restrictions Comments: Poor awareness, L lean, ataxic Restrictions Weight Bearing Restrictions Per Provider Order: No Pain Interference Pain Interference Pain Effect on Sleep: 1. Rarely or not at all Pain Interference with Therapy Activities: 1. Rarely or not at all Pain Interference with  Day-to-Day Activities: 1. Rarely or not at all Vision/Perception  Vision - History Ability to See in Adequate Light: 0 Adequate Vision - Assessment Alignment/Gaze Preference: Head tilt;Head turned Perception Perception: Impaired Preception Impairment Details: Inattention/Neglect;Spatial orientation Perception-Other Comments: L inattention with very poor motor planning Praxis Praxis: Impaired Praxis Impairment Details: Limb apraxia;Ideomotor;Ideation;Motor planning;Organization  Cognition Overall Cognitive Status: Impaired/Different from baseline Arousal/Alertness: Awake/alert Orientation Level: Oriented to person;Oriented to place;Oriented to situation;Disoriented to time Awareness: Impaired Awareness Impairment: Intellectual impairment Problem Solving: Impaired Organizing: Impaired Self Monitoring: Impaired Self Correcting: Impaired Self Correcting Impairment: Verbal basic;Functional basic Behaviors: Poor frustration tolerance Safety/Judgment: Impaired Comments: pt continues to present w/ no awareness and impulsivity Sensation Sensation Light Touch: Impaired Detail Light Touch Impaired Details: Impaired LUE;Impaired LLE Proprioception Impaired Details: Impaired LUE;Impaired RLE;Impaired LLE Coordination Gross Motor Movements are Fluid and Coordinated: No Fine Motor Movements are Fluid and Coordinated: No Coordination and Movement Description: Severe ataxia, extensor tone in the BLE at times, poor trunk control Heel Shin Test: BLE impaired Motor  Motor Motor: Ataxia;Hemiplegia Motor - Skilled Clinical Observations: Ataxia, L hemi  Mobility Bed Mobility Bed Mobility: Supine to Sit;Sit to Supine Supine to Sit: Supervision/Verbal cueing Sit to Supine: Supervision/Verbal cueing Transfers Transfers: Sit to Stand;Stand to Sit;Stand Pivot Transfers Sit to Stand: Minimal Assistance - Patient > 75% Stand to Sit: Minimal Assistance - Patient > 75% Stand Pivot Transfers:  Moderate Assistance - Patient 50 - 74% Stand Pivot Transfer Details: Tactile cues for posture;Tactile cues for placement;Tactile cues for weight shifting;Verbal cues for sequencing;Manual facilitation for weight shifting Transfer (Assistive device): None Locomotion  Gait Ambulation: Yes Gait Assistance: 2 Helpers Gait Distance (Feet): 175 Feet Assistive device: None Gait Assistance Details: Tactile cues for weight shifting;Tactile cues for sequencing;Verbal cues for technique;Verbal cues for gait pattern Gait Gait:  Yes Gait Pattern: Impaired Gait Pattern: Ataxic Stairs / Additional Locomotion Stairs: No Wheelchair Mobility Wheelchair Mobility: Yes Wheelchair Assistance: Dependent - Patient 0% Wheelchair Parts Management: Needs assistance Distance: 150'  Trunk/Postural Assessment  Cervical Assessment Cervical Assessment: Exceptions to [facility] (Rt cervical rotation) Thoracic Assessment Thoracic Assessment: Within Functional Limits Lumbar Assessment Lumbar Assessment: Exceptions to [facility] (singificant posterior pelvic tilt) Postural Control Postural Control: Deficits on evaluation Trunk Control: decreased, poor proprioception and ataxia. Sits in large posterior tilt to compensate Righting Reactions: delayed and inadequate  Balance Balance Balance Assessed: Yes Static Sitting Balance Static Sitting - Balance Support: Feet supported Static Sitting - Level of Assistance: 5: Stand by assistance Dynamic Sitting Balance Dynamic Sitting - Balance Support: Feet supported Dynamic Sitting - Level of Assistance: 5: Stand by assistance;4: Min assist Static Standing Balance Static Standing - Balance Support: Right upper extremity supported Static Standing - Level of Assistance: 4: Min assist Dynamic Standing Balance Dynamic Standing - Balance Support: Right upper extremity supported Dynamic Standing - Level of Assistance: 3: Mod assist Extremity Assessment  RLE Assessment RLE Assessment:  Within Functional Limits LLE Assessment General Strength Comments: Grossly at least 3/5, but severely ataxic   [name], PT, DPT 08/21/2024, 4:16 PM

## 2024-08-21 NOTE — Progress Notes (Signed)
 Patient ID: Joshua Harmon, male   DOB: 09/24/1961, 63 y.o.   MRN: [id number]  SW received updates from [facility] reporting at capacity and unable to accept referral.   SW received updates from MD reporting pt will need ambulance transport to his appointment due to transfers being unsafe.   1025- SW spoke with [place] with [facility] transportation to appt today. Pick up scheduled for 1:15pm. SW informed medical team.   [facility] able to accept referral for HHPT/OT/SLP/SN/SW.   1208- [facility] for [facility] to inform on above plan about transportation needs for discharge home after appointment.   1210- SW made efforts to speak with nursing but no answer. SW will continue to make efforts to coordinate his care needs.   1238-SW spoke with pt s/o [name] to inform on above about HHA, FMLA forms received, and to ensure that she asks about transportation arrangement to home after appointment.   1253-SW spoke with RN Dagoberto with Dr. Buckley to inform on above. SW shared discharge plan and need for transportation coordination once his appointment is finished. SW spoke with Devere SW present to discuss above. SW confirmed d/c plan is for home and the reason hospital transportation to home is needed. Would like this SW to scheduled transportation. This SW informed medical necessity form will need to be completed for discharge to home.  1307- SW scheduled transpotation to home to with [facility] ambulance for pick up at 3pm for discharge to home.   [name] Jude, MSW, LCSW Office: [phone] Cell: [phone] Fax: [phone]

## 2024-08-21 NOTE — Telephone Encounter (Signed)
 Oral Oncology Patient Advocate Encounter  After completing a benefits investigation, prior authorization for temozolomide (TEMODAR) 140 MG capsule  is not required at this time through [facility].  Patient must fill at [facility].   Lucie Lamer, CPhT [place]  [facility] Pharmacy Technician Patient Advocate Specialist II THERESSA [place] Phone: [phone]  Fax: [phone] [email or web address]

## 2024-08-21 NOTE — Progress Notes (Signed)
 Off the floor for  CT Head.

## 2024-08-21 NOTE — Plan of Care (Signed)
  Problem: Consults Goal: RH GENERAL PATIENT EDUCATION Description: See Patient Education module for education specifics. Outcome: Progressing   Problem: RH SKIN INTEGRITY Goal: RH STG SKIN FREE OF INFECTION/BREAKDOWN Description: Manage skin free of infection with supervision- min assistance  Outcome: Progressing   Problem: RH PAIN MANAGEMENT Goal: RH STG PAIN MANAGED AT OR BELOW PT'S PAIN GOAL Description: < 4 w/ prns Outcome: Progressing

## 2024-08-21 NOTE — Plan of Care (Signed)
  Problem: RH Problem Solving Goal: LTG Patient will demonstrate problem solving for (SLP) Description: LTG:  Patient will demonstrate problem solving for basic/complex daily situations with cues  (SLP) Outcome: Completed/Met   Problem: RH Memory Goal: LTG Patient will demonstrate ability for day to day (SLP) Description: LTG:   Patient will demonstrate ability for day to day recall/carryover during cognitive/linguistic activities with assist  (SLP) Outcome: Completed/Met   Problem: RH Attention Goal: LTG Patient will demonstrate this level of attention during functional activites (SLP) Description: LTG:  Patient will will demonstrate this level of attention during functional activites (SLP) Outcome: Completed/Met

## 2024-08-21 NOTE — Progress Notes (Signed)
   08/20/24 2124  What Happened  Was fall witnessed? No  Was patient injured? No  Patient found in bathroom  Found by Staff-comment Constantine RN)  Stated prior activity other (comment) (patent was assisted to the bathroom  then left unsupervised)  Provider Notification  Provider Name/Title Fidela Ned  Date Provider Notified 08/20/24  Time Provider Notified 2314  Method of Notification Call  Notification Reason Fall  Provider response See new orders;Other (Comment) (CT Head without contrast)  Date of Provider Response 08/20/24  Time of Provider Response 2315  Follow Up  Family notified Yes - comment (attempted to call girlfriend  [phone] twice no answe, r)  Time family notified 2325  Adult Fall Risk Assessment  Risk Factor Category (scoring not indicated) High fall risk per protocol (document High fall risk)  Age 63  Fall History: Fall within 6 months prior to admission 0  Elimination; Bowel and/or Urine Incontinence 0  Elimination; Bowel and/or Urine Urgency/Frequency 0  Medications: includes PCA/Opiates, Anti-convulsants, Anti-hypertensives, Diuretics, Hypnotics, Laxatives, Sedatives, and Psychotropics 3  Patient Care Equipment 1  Mobility-Assistance 2  Mobility-Gait 2  Mobility-Sensory Deficit 0  Altered awareness of immediate physical environment 0  Impulsiveness 2  Lack of understanding of one's physical/cognitive limitations 0  Total Score 11  Patient Fall Risk Level High fall risk  Adult Fall Risk Interventions  Required Bundle Interventions *See Row Information* High fall risk  Additional Interventions Use of appropriate toileting equipment (bedpan, BSC, etc.);HeadStart bed sensor (education/return demonstration)  Screening for Fall Injury Risk (To be completed on HIGH fall risk patients) - Assessing Need for Floor Mats  Risk For Fall Injury- Criteria for Floor Mats Noncompliant with safety precautions  Will Implement Floor Mats Yes  Vitals  Temp 98.6 F (37 C)   Temp Source Oral  BP (!) 130/99  MAP (mmHg) 108  BP Location Left Arm  BP Method Automatic  Patient Position (if appropriate) Lying  Pulse Rate 85  Pulse Rate Source Dinamap  Resp 17  Oxygen Therapy  SpO2 95 %  O2 Device Room Air  Pain Assessment  Pain Scale 0-10  Pain Score 0  PCA/Epidural/Spinal Assessment  Respiratory Pattern Unlabored;Regular  Neurological  Neuro (WDL) X  Level of Consciousness Alert  Orientation Level Oriented X4  Cognition Follows commands;Impulsive  Speech Clear  R Pupil Size (mm) 4  R Pupil Shape Round  R Pupil Reaction Brisk  L Pupil Size (mm) 4  L Pupil Shape Round  L Pupil Reaction Brisk  R Hand Grip Strong  L Hand Grip Moderate  RUE Motor Response Purposeful movement  RUE Sensation Full sensation  RUE Motor Strength 5  LUE Motor Response Purposeful movement  LUE Sensation Full sensation  LUE Motor Strength 4  RLE Motor Response Purposeful movement  RLE Sensation Full sensation  RLE Motor Strength 4  LLE Motor Response Purposeful movement  LLE Sensation Full sensation  LLE Motor Strength 4  Glasgow Coma Scale  Eye Opening 4  Best Verbal Response (NON-intubated) 5  Best Motor Response 6  Glasgow Coma Scale Score 15  Musculoskeletal  Musculoskeletal (WDL) X  Assistive Device [place];Wheelchair  Generalized Weakness Yes  Integumentary  Integumentary (WDL) WDL  Skin Color Appropriate for ethnicity  Skin Condition Dry  Skin Integrity Other (Comment) (surgical wound)  Abrasion Location Leg  Skin Turgor Non-tenting

## 2024-08-21 NOTE — Progress Notes (Signed)
 PROGRESS NOTE   Subjective/Complaints:  Patient had a fall overnight, states he was left in the bathroom by nursing and did not remember to call them to come and help them.  Was found down on the floor, states he fell on his butt, CT head negative.  Denies any other complaints or concerns.  Vitals remained stable  Significant concerns by the team for unsafe discharge given significant impulsivity with evaluations yesterday, paired with poor awareness of ataxia.  Recommended to patient and wife discharged to oncology appointment to initiate treatment, then going to the ER at [facility] for possible admission given unsafe to return home.  Patient's wife states that she wishes to bring him home anyways for trial, understanding he is at significant risk of falls and requires max a +2 assist at this time, which she cannot provide.   ROS: Patient denies chills, rash, sore throat, blurred vision, dizziness, nausea, vomiting, diarrhea, cough, shortness of breath or chest pain, joint or back/neck pain,   or mood change.  Denies new motor or sensory changes.  Objective:   CT HEAD WO CONTRAST ( ) Result Date: 08/21/2024 CLINICAL DATA:  Initial evaluation for un witnessed fall. EXAM: CT HEAD WITHOUT CONTRAST TECHNIQUE: Contiguous axial images were obtained from the base of the skull through the vertex without intravenous contrast. RADIATION DOSE REDUCTION: This exam was performed according to the departmental dose-optimization program which includes automated exposure control, adjustment of the mA and/or kV according to patient size and/or use of iterative reconstruction technique. COMPARISON:  CT from 07/31/2024. FINDINGS: Brain: Patient's known right cerebral mass involving the right frontal parietal region again seen, grossly similar in size as compared to recent exams. Previously seen post biopsy hemorrhage has largely resolved. Surrounding edema  is relatively similar. No other acute intracranial hemorrhage. No acute large vessel territory infarct. No other mass lesion or mass effect. No significant midline shift. No extra-axial fluid collection. Vascular: No abnormal hyperdense vessel. Skull: Scalp soft tissues demonstrate no acute finding. Prior right frontal burr hole craniotomy noted. Calvarium otherwise intact. Sinuses/Orbits: Globes orbital soft tissues demonstrate no acute finding. Remote posttraumatic defect noted at the right lamina papyracea. Paranasal sinuses are largely clear. No significant mastoid effusion. Other: None. IMPRESSION: 1. Relatively stable size of patient's known right cerebral mass/GBM, with interval resolution of previously seen post biopsy hemorrhage. Surrounding mass effect is relatively stable. No significant midline shift. 2. No other new acute intracranial abnormality. Electronically Signed   By: Morene Hoard M.D.   On: 08/21/2024 01:12    No results for input(s): WBC, HGB, HCT, PLT in the last 72 hours.   No results for input(s): NA, K, CL, CO2, GLUCOSE, BUN, CREATININE, CALCIUM in the last 72 hours.      Intake/Output Summary (Last 24 hours) at 08/21/2024 1640 Last data filed at 08/21/2024 1056 Gross per 24 hour  Intake 476 ml  Output 1100 ml  Net -624 ml        Physical Exam: Vital Signs Blood pressure 114/79, pulse 62, temperature 98 F (36.7 C), temperature source Oral, resp. rate 18, height 6' 1 (1.854 m), weight 69.5 kg, SpO2 97%.  Constitutional: No distress .  Awake, alert.  Reclining in bed. HEENT: right crani site CDI.  EOMI, oral membranes moist Neck: Some right sided tone, mostly supple,.  No JVD. Cardiovascular: Tachycardic, no murmurs rubs or gallops. Respiratory/Chest: Clear to auscultation bilaterally, nonlabored breathing GI/Abdomen: BS +, non-tender, non-distended, soft Ext: no clubbing, cyanosis, or edema Psych: pleasant and cooperative   Skin: C/D/I. No apparent lesions. .  Well-approximated.  Right crani site well-approximated, healing.  Neurologic exam:   Pt alert and oriented x 3. Right gaze preference./Left hemineglect--ongoing Some moderate difficulty with attention and insight-continues to improve  MMT: RUE and RLE grossly 5/5 prox to distal--unchanged LUE 5-/5 LUE and LLE .  --Complicated by hemineglect  Sensation slightly altered to light touch in left upper extremity DTR's 1+ on the right, 2+ on the left Significant ataxia in left upper extremity, mild in left lower and right lower extremity  tone: MAS 2 left elbow flexor, 1 shoulder adductor,1  knee flexor 1 knee extensor .--Unchanged Head remains preferentially position to the right, but can bring to midline  Physical exam unchanged from the above on reexamination 08/21/24     Assessment/Plan: 1. Functional deficits which require 3+ hours per day of interdisciplinary therapy in a comprehensive inpatient rehab setting. Physiatrist is providing close team supervision and 24 hour management of active medical problems listed below. Physiatrist and rehab team continue to assess barriers to discharge/monitor patient progress toward functional and medical goals  Care Tool:  Bathing    Body parts bathed by patient: Left arm, Chest, Abdomen, Front perineal area, Right upper leg, Face, Right arm, Buttocks, Left lower leg, Right lower leg, Left upper leg   Body parts bathed by helper: Right lower leg, Left lower leg Body parts n/a: Right arm   Bathing assist Assist Level: Minimal Assistance - Patient > 75%     Upper Body Dressing/Undressing Upper body dressing   What is the patient wearing?: Pull over shirt    Upper body assist Assist Level: Minimal Assistance - Patient > 75%    Lower Body Dressing/Undressing Lower body dressing      What is the patient wearing?: Underwear/pull up, Pants     Lower body assist Assist for lower body dressing: Moderate  Assistance - Patient 50 - 74%     Toileting Toileting    Toileting assist Assist for toileting: Moderate Assistance - Patient 50 - 74%     Transfers Chair/bed transfer  Transfers assist     Chair/bed transfer assist level: Moderate Assistance - Patient 50 - 74%     Locomotion Ambulation   Ambulation assist   Ambulation activity did not occur: Safety/medical concerns (2/2 strong posture deficit and fatigue)  Assist level: 2 helpers Assistive device: Walker-rolling Max distance: 175'   Walk 10 feet activity   Assist  Walk 10 feet activity did not occur: Safety/medical concerns (2/2 strong posture deficit and fatigue)  Assist level: 2 helpers Assistive device: Hand held assist   Walk 50 feet activity   Assist Walk 50 feet with 2 turns activity did not occur: Safety/medical concerns (2/2 strong posture deficit and fatigue)  Assist level: 2 helpers Assistive device: Hand held assist    Walk 150 feet activity   Assist Walk 150 feet activity did not occur: Safety/medical concerns (2/2 strong posture deficit and fatigue)  Assist level: 2 helpers Assistive device: Hand held assist    Walk 10 feet on uneven surface  activity   Assist Walk 10 feet on uneven surfaces activity did not occur:  Safety/medical concerns         Wheelchair     Assist Is the patient using a wheelchair?: Yes Type of Wheelchair: Manual    Wheelchair assist level: Dependent - Patient 0% Max wheelchair distance: 342ft    Wheelchair 50 feet with 2 turns activity    Assist        Assist Level: Dependent - Patient 0%   Wheelchair 150 feet activity     Assist      Assist Level: Dependent - Patient 0%   Blood pressure 114/79, pulse 62, temperature 98 F (36.7 C), temperature source Oral, resp. rate 18, height 6' 1 (1.854 m), weight 69.5 kg, SpO2 97%.  Medical Problem List and Plan: 1. 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 Janjua 9/8             -patient may  shower             -ELOS/Goals: 12-14 days, supervision to min assist with self-care and mobility, mod I to supervision with cognition --  DC 9/30 to appointment  -Continue CIR therapies including PT, OT, and SLP     - 9/16: On/off confusion for night shift; telesitter started yesterday. Poor proprioception in LUE and LLE; difficulty with L sided attention. Max A LBD, Mod-Max A transfers, making very good progress. Mod A for bed mobility and transfers, walked 50 ft with Min A x2. L ataxia. Perseverative. Mod-severe issues with memory and problem solving. L oral pocketing.    - Will need note for driving restrictions  -0-81: Dr. Eward NP will follow-up with patient , they will be meeting with him at 1 PM this coming Tuesday.  Unsure of plan at this time.  May need to facilitate early discharge if chemo/radiation therapy required urgently--discussed with family 9/19--appointment changed to 9-30 at 2 PM, with plans to discharge to appointment per Dr. Buckley  -- Teams tomorrow a.m., will need to discuss adjustment of goals to accommodate timeline for discharge   - 9/23: Motor planning and proprioceptive deficits - gets into unsafe positions with transfers and turns. SLP L inattention limiting cog - goals Min-Mod A.   9-29: Continues to be very impulsive and resistant to adaptive strategies; attempted car transfer with family today which went very poorly.  Team to discuss with girlfriend in AM, very concerned for continued unsafe practices at discharge.  However, per Dr. Buckley, wound continues to decline without cancer treatment and thus extended rehab stay is not in the patient's best interest.    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  This makes him a very high risk for falls,  especially from low surfaces which require max assist for him to power up to stand.  Patient would benefit greatly from a hospital bed to allow for bed elevation for raising to a standing position, in addition to side rails to prevent him from impulsively falling out of bed at nighttime.  The patient is medically ready for discharge to home and will not need follow-up with [facility]. In addition, they will need to follow up with their PCP, Neurosurgery.  He remains unsafe for home discharge from a PT/OT standpoint, however family understands risks and wishes to proceed with trial discharge home today.    2.  Antithrombotics: -DVT/anticoagulation:  Mechanical:  Antiembolism stockings, knee (TED hose) Bilateral lower extremities Sequential compression devices, below knee Bilateral lower extremities             -antiplatelet therapy: N/A  3. Pain Management: Tylenol , Fioricet as needed  - no pain endorsed  4. Mood/Behavior/Sleep: LCSW to follow for evaluation and support when available.              -antipsychotic agents: N/A   - 9/16: Started on telesitter overnight for impulsivity; will start sleep log, continue trazodone  50 mg at bedtime PRN--sleep log appropriate  5. Neuropsych/cognition: This patient is not capable of making decisions on his own behalf.   - 9/16: start ritalin  5 mg BID for attention and lethargy--continue trial, arousal much improved but concentration remains difficult  9-19: Per patient's fianc, seen considerable improvements.  Ongoing severe deficits per therapy note; concurrent continue current regimen  9-22: Remains very poor insight, no appreciable improvements.  Will DC Ritalin  for trial.  9-24: Resume Ritalin  due to apparent worse hemineglect with therapies today, worse attention--much improved 9-26  9-29: Patient remains very impulsive, frustrated.  Cognitively is improving, seems partially from for poor insight but also partially from personality.  Will be challenging  for home discharge, discussed with patient and family in AM.  6. Skin/Wound Care: Routine pressure-relief measures             -Per neurosurgery staple removal planned for this week (POD #10 9/18)--confirmed with Dr. Rosslyn, order placed to remove staples 9-19  7. Fluids/Electrolytes/Nutrition: Monitor intake and output and routine follow-up labs in a.m.              - on regular/ thins diet             - GERD: continue PPI  8.  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 Janjua on 9/12 decrease to 2 mg q8h indefinitely until radiation treatment begins - 9-19: IDH testing unable to be performed due to insufficient sample; per Dr. Buckley, will not alter or delay treatment, will send out  9.  Impaired glucose tolerance: Secondary to steroids CBGs have been below 180 has not required coverage.              - CBG monitoring was every 4 hours now 2x daily --has been stable, continue to hold SSI   - 9/15: CBG remain between 100 and 150; can stop CBGs    10.  Constipation: pt had bm yesterday -senokot-s 1 tab at bedtime -continue miralax  prn Last bowel movement 9-28    11.  Leukocytosis.  likely secondary to Decadron . check urine and chest x-ray today to screen for secondary causes.  Low threshold for blood cultures and broad-spectrum antibiotics if he develops vital instability.  - 9-14: Urinalysis clear, chest x-ray with no acute process.  Vitally stable, afebrile.  Trend  9/15 sl increase to 15k today. Pt afebrile, no s/s infection. UA/CXR as above  9-22: Remains slightly elevated on Decadron  2 mg every 8; afebrile, no signs of infection, monitor.  Further taper per oncology Dr. Buckley.  12.  Right neck tone/spasticity.  Start baclofen  5 mg 3 times daily.  Not currently bothersome, but want to prevent creasing and skin breakdown if possible.  - 9-14: Head positioning much better today,  likely more related to hemineglect.  Move baclofen  to 3 times daily as needed.  9/15 pt fairly neutral today--left  inattention is biggest factor  -18: Range of motion, inattention improving.  9-23: Starting to develop some spasticity in the left elbow.  Not using PRNs, not bothersome at this time.  Can range to neutral easily.  Continue to monitor  continue baclofen --does seem to have mild improvement in tone  13.  Hyponatremia.  Mild, 133, but downtrending.  Will get blood and urine studies today to evaluate for SIADH; may also be related to ongoing glucocorticoids.  Placed fluid restriction.  - 9/24: Patient refused labs yesterday; est serum osm 278. 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 Continue fluid restriction.  Discussed this with him today  Na reviewed and is 134--improved, continue to monitor on discharge  LOS: 18 days A FACE TO FACE EVALUATION WAS PERFORMED  Joesph JAYSON Likes 08/21/2024, 4:40 PM

## 2024-08-21 NOTE — Progress Notes (Signed)

## 2024-08-21 NOTE — Progress Notes (Signed)
 [facility] [address]  [place], [place] [address] [phone]   New Patient Evaluation  Date of Service: 08/21/24 Patient Name: Joshua Harmon Patient MRN: [id number] Patient DOB: 1961-02-12 Provider: Arthea MARLA Manns, MD  Identifying Statement:  Joshua Harmon is a 63 y.o. male with right frontal glioblastoma who presents for initial consultation and evaluation.    Referring Provider: Rosslyn Dino HERO, MD [address] [place] [address] [place],  [place] [address]  Oncologic History 07/30/24: Stereotactic biopsy with Dr. Rosslyn; path is grade 4 glioma IDH pending  Biomarkers:  MGMT Unknown.  IDH 1/2 Unknown.  EGFR Unknown  TERT Unknown   History of Present Illness: The patient's records from the referring physician were obtained and reviewed and the patient interviewed to confirm this HPI.  Bejamin Hackbart Tatsch presented to neurologic attention this month with complaint of progressive left sided weakness.  His leg was affected more than the arm, limiting his ability to walk on his own.  CNS imaging demonstrated enhancing mass within right frontal lobe consistent with primary brain tumor.  He underwent biopsy on 07/30/24 with Dr. Rosslyn; path demonstrated high grade glioma.  In the interim he has been in rehab strengthening his left side.  He is doing some walking with a walker, and his left arm strength is good now.  Dosing decadron  2mg  three times per day.  No seizures or headaches.   Medications: Current Outpatient Medications on File Prior to Visit  Medication Sig Dispense Refill   acetaminophen  (TYLENOL ) 325 MG tablet Take 2 tablets (650 mg total) by mouth every 6 (six) hours as needed for mild pain (pain score 1-3) or fever (or Fever >/= 101).     Baclofen  5 MG TABS Take 1 tablet (5 mg total) by mouth 2 (two) times daily. 60 tablet 0   butalbital -acetaminophen -caffeine  (FIORICET) 50-325-40 MG tablet Take 2 tablets by mouth every 4 (four)  hours as needed for headache (moderate pain). 14 tablet 0   dexamethasone  (DECADRON ) 4 MG tablet Take 0.5 tablets (2 mg total) by mouth every 8 (eight) hours. 45 tablet 0   [START ON 08/22/2024] magnesium gluconate (MAGONATE) 500 (27 Mg) MG TABS tablet Take 0.5 tablets (250 mg total) by mouth daily. 30 tablet 0   [START ON 08/22/2024] methylphenidate  (RITALIN ) 5 MG tablet Take 1 tablet (5 mg total) by mouth 2 (two) times daily with breakfast and lunch. 60 tablet 0   pantoprazole  (PROTONIX ) 40 MG tablet Take 1 tablet (40 mg total) by mouth daily. 30 tablet 0   polyethylene glycol powder (GLYCOLAX /MIRALAX ) 17 GM/SCOOP powder Dissolve 1 capful (17g) in 4-8 ounces of liquid and take by mouth daily as needed for moderate constipation. 238 g 0   senna-docusate (SENOKOT-S) 8.6-50 MG tablet Take 1 tablet by mouth at bedtime as needed for mild constipation. 30 tablet 0   traZODone  (DESYREL ) 50 MG tablet Take 1 tablet (50 mg total) by mouth at bedtime as needed for sleep. 30 tablet 0   No current facility-administered medications on file prior to visit.    Allergies: No Known Allergies Past Medical History:  Past Medical History:  Diagnosis Date   GERD (gastroesophageal reflux disease)    Past Surgical History:  Past Surgical History:  Procedure Laterality Date   APPLICATION OF CRANIAL NAVIGATION Right 07/30/2024   Procedure: COMPUTER-ASSISTED NAVIGATION, FOR CRANIAL PROCEDURE;  Surgeon: Rosslyn Dino HERO, MD;  Location: [facility] OR;  Service: Neurosurgery;  Laterality: Right;  CRANIAL NAVIGATION   LEG SURGERY Bilateral as a child   STERIOTACTIC STIMULATOR INSERTION Right 07/30/2024   Procedure: RIGHT STEREOTACTIC BIOPSY;  Surgeon: Rosslyn Dino HERO, MD;  Location: [facility] OR;  Service: Neurosurgery;  Laterality: Right;  RIGHT STERIOTACTIC BRAIN BIOPSY   XI ROBOTIC ASSISTED INGUINAL HERNIA REPAIR WITH MESH Right 02/19/2020   Procedure: XI ROBOTIC ASSISTED Right INGUINAL HERNIA REPAIR WITH MESH;  Surgeon: Jordis Laneta FALCON, MD;  Location: [facility] ORS;  Service: General;  Laterality: Right;   Social History:  Social History   Socioeconomic History   Marital status: Married    Spouse name: Not on file   Number of children: Not on file   Years of education: Not on file   Highest education level: Not on file  Occupational History   Not on file  Tobacco Use   Smoking status: Every Day    Current packs/day: 0.25    Types: Cigarettes   Smokeless tobacco: Never  Vaping Use   Vaping status: Never Used  Substance and Sexual Activity   Alcohol use: Not Currently    Alcohol/week: 10.0 standard drinks of alcohol    Types: 10 Cans of beer per week   Drug use: Not Currently   Sexual activity: Yes    Partners: Female  Other Topics Concern   Not on file  Social History Narrative   Not on file   Social Drivers of Health   Financial Resource Strain: Medium Risk (05/07/2024)   Received from [facility]   Overall Financial Resource Strain (CARDIA)    Difficulty of Paying Living Expenses: Somewhat hard  Food Insecurity: No Food Insecurity (07/27/2024)   Hunger Vital Sign    Worried About Running Out of Food in the Last Year: Never true    Ran Out of Food in the Last Year: Never true  Recent Concern: Food Insecurity - Food Insecurity Present (05/07/2024)   Received from [facility]   Hunger Vital Sign    Within the past 12 months, you worried that your food would run out before you got the money to buy more.: Sometimes true    Ran Out of Food in the Last Year: Not on file  Transportation Needs: No Transportation Needs (07/27/2024)   PRAPARE - Administrator, Civil Service (Medical): No    Lack of Transportation (Non-Medical): No  Recent Concern: Transportation Needs - Unmet Transportation Needs (05/07/2024)   Received from [facility] - Transportation    In the past 12 months, has lack of transportation kept you from medical appointments  or from getting medications?: No    Lack of Transportation (Non-Medical): Yes  Physical Activity: Not on file  Stress: Not on file  Social Connections: Unknown (07/25/2024)   Social Connection and Isolation Panel    Frequency of Communication with Friends and Family: Patient declined    Frequency of Social Gatherings with Friends and Family: Patient declined    Attends Religious Services: Patient declined    Database administrator or Organizations: Patient declined    Attends Banker Meetings: Patient declined    Marital Status: Living with partner  Intimate Partner Violence: Not At Risk (07/27/2024)   Humiliation, Afraid, Rape, and Kick questionnaire    Fear of Current or Ex-Partner: No    Emotionally Abused: No    Physically Abused: No    Sexually Abused: No   Family History:  Family History  Family  history unknown: Yes    Review of Systems: Constitutional: Doesn't report fevers, chills or abnormal weight loss Eyes: Doesn't report blurriness of vision Ears, nose, mouth, throat, and face: Doesn't report sore throat Respiratory: Doesn't report cough, dyspnea or wheezes Cardiovascular: Doesn't report palpitation, chest discomfort  Gastrointestinal:  Doesn't report nausea, constipation, diarrhea GU: Doesn't report incontinence Skin: Doesn't report skin rashes Neurological: Per HPI Musculoskeletal: Doesn't report joint pain Behavioral/Psych: Doesn't report anxiety  Physical Exam: Vitals:   08/21/24 1411  BP: 117/87  Pulse: 95  Resp: 18  Temp: 98.4 F (36.9 C)  SpO2: 95%   KPS: 70. General: Alert, cooperative, pleasant, in no acute distress Head: Normal EENT: No conjunctival injection or scleral icterus.  Lungs: Resp effort normal Cardiac: Regular rate Abdomen: Non-distended abdomen Skin: No rashes cyanosis or petechiae. Extremities: No clubbing or edema  Neurologic Exam: Mental Status: Awake, alert, attentive to examiner. Oriented to self and  environment. Language is fluent with intact comprehension.  Cranial Nerves: Visual acuity is grossly normal. Visual fields are full. Extra-ocular movements intact. No ptosis. Face is symmetric Motor: Tone and bulk are normal. Power is 4/5 in right leg, 4+/5 in right arm. Reflexes are symmetric, no pathologic reflexes present.  Sensory: Intact to light touch Gait: Deferred   Labs: I have reviewed the data as listed    Component Value Date/Time   NA 134 (L) 08/18/2024 0502   K 4.5 08/18/2024 0502   CL 99 08/18/2024 0502   CO2 25 08/18/2024 0502   GLUCOSE 120 (H) 08/18/2024 0502   BUN 16 08/18/2024 0502   CREATININE 0.77 08/18/2024 0502   CALCIUM 8.7 (L) 08/18/2024 0502   PROT 6.3 (L) 08/13/2024 1010   ALBUMIN 3.1 (L) 08/13/2024 1010   AST 22 08/13/2024 1010   ALT 48 (H) 08/13/2024 1010   ALKPHOS 57 08/13/2024 1010   BILITOT 0.7 08/13/2024 1010   GFRNONAA >60 08/18/2024 0502   GFRAA >60 01/29/2020 1418   Lab Results  Component Value Date   WBC 16.9 (H) 08/13/2024   NEUTROABS 14.3 (H) 08/13/2024   HGB 15.9 08/13/2024   HCT 47.6 08/13/2024   MCV 91.9 08/13/2024   PLT 344 08/13/2024    Imaging:  CT HEAD WO CONTRAST ( ) Result Date: 08/21/2024 CLINICAL DATA:  Initial evaluation for un witnessed fall. EXAM: CT HEAD WITHOUT CONTRAST TECHNIQUE: Contiguous axial images were obtained from the base of the skull through the vertex without intravenous contrast. RADIATION DOSE REDUCTION: This exam was performed according to the departmental dose-optimization program which includes automated exposure control, adjustment of the mA and/or kV according to patient size and/or use of iterative reconstruction technique. COMPARISON:  CT from 07/31/2024. FINDINGS: Brain: Patient's known right cerebral mass involving the right frontal parietal region again seen, grossly similar in size as compared to recent exams. Previously seen post biopsy hemorrhage has largely resolved. Surrounding edema is  relatively similar. No other acute intracranial hemorrhage. No acute large vessel territory infarct. No other mass lesion or mass effect. No significant midline shift. No extra-axial fluid collection. Vascular: No abnormal hyperdense vessel. Skull: Scalp soft tissues demonstrate no acute finding. Prior right frontal burr hole craniotomy noted. Calvarium otherwise intact. Sinuses/Orbits: Globes orbital soft tissues demonstrate no acute finding. Remote posttraumatic defect noted at the right lamina papyracea. Paranasal sinuses are largely clear. No significant mastoid effusion. Other: None. IMPRESSION: 1. Relatively stable size of patient's known right cerebral mass/GBM, with interval resolution of previously seen post biopsy hemorrhage. Surrounding mass effect is  relatively stable. No significant midline shift. 2. No other new acute intracranial abnormality. Electronically Signed   By: Morene Hoard M.D.   On: 08/21/2024 01:12   DG Chest 2 View Result Date: 08/04/2024 EXAM: 2 VIEW(S) XRAY OF THE CHEST 08/04/2024 12:15:00 PM COMPARISON: None available. CLINICAL HISTORY: Pt order states leukocytosis. FINDINGS: LUNGS AND PLEURA: No focal pulmonary opacity. No pulmonary edema. No pleural effusion. No pneumothorax. HEART AND MEDIASTINUM: No acute abnormality of the cardiac and mediastinal silhouettes. Hiatal hernia. BONES AND SOFT TISSUES: No acute osseous abnormality. IMPRESSION: 1. No acute process. 2. Hiatal hernia. Electronically signed by: Katheleen Faes MD 08/04/2024 04:02 PM EDT RP Workstation: [id number]   CT HEAD WO CONTRAST Result Date: 07/31/2024 CLINICAL DATA:  Initial postoperative evaluation. EXAM: CT HEAD WITHOUT CONTRAST TECHNIQUE: Contiguous axial images were obtained from the base of the skull through the vertex without intravenous contrast. RADIATION DOSE REDUCTION: This exam was performed according to the departmental dose-optimization program which includes automated exposure control,  adjustment of the mA and/or kV according to patient size and/or use of iterative reconstruction technique. COMPARISON:  Prior MRI from 07/25/2024. FINDINGS: Brain: Postoperative changes from interval right frontal burr hole craniotomy for presumed biopsy of previously identified right cerebral mass. Small amount of postoperative pneumocephalus overlies the right cerebral convexity. Small volume hemorrhage seen along the biopsy tract at the level of the right frontal operculum. Hemorrhage within the biopsy cavity/mass itself measures 1.8 x 1.4 x 1.8 cm (series 2, image 20). The underlying mass itself is otherwise grossly stable. Associated regional mass effect with trace right-to-left shift at the septum pellucidum, not significantly changed. No visible intraventricular hemorrhage. No other acute intracranial hemorrhage. No acute large vessel territory infarct. No other mass lesion or mass effect. No hydrocephalus or extra-axial fluid collection. Vascular: No abnormal hyperdense vessel. Scattered calcified atherosclerosis present at the skull base. Skull: Post craniotomy changes at the right frontal scalp. Skin staples in place. No adverse features. Sinuses/Orbits: Globes orbital soft tissues demonstrate no acute finding. Remote posttraumatic defect noted at the right lamina papyracea. Paranasal sinuses are largely clear. No mastoid effusion. Other: None. IMPRESSION: 1. Postoperative changes from interval right frontal burr hole craniotomy for biopsy of patient's known right cerebral mass. Small volume postoperative hemorrhage along the biopsy tract and within the resection cavity itself as detailed above. No other complicating features. 2. No other new acute intracranial abnormality. Electronically Signed   By: Morene Hoard M.D.   On: 07/31/2024 02:01   CT CHEST ABDOMEN PELVIS W CONTRAST Result Date: 07/25/2024 CLINICAL DATA:  Brain mass, assess for metastatic disease. EXAM: CT CHEST, ABDOMEN, AND PELVIS  WITH CONTRAST TECHNIQUE: Multidetector CT imaging of the chest, abdomen and pelvis was performed following the standard protocol during bolus administration of intravenous contrast. RADIATION DOSE REDUCTION: This exam was performed according to the departmental dose-optimization program which includes automated exposure control, adjustment of the mA and/or kV according to patient size and/or use of iterative reconstruction technique. CONTRAST:  OMNIPAQUE  IOHEXOL  300 MG/ML  SOLN COMPARISON:  Abdominopelvic CT 01/29/2020 FINDINGS: CT CHEST FINDINGS Cardiovascular: Normal heart size. No pericardial effusion. Minimal aortic atherosclerosis without aneurysm. Mediastinum/Nodes: No mediastinal or hilar adenopathy. Small hiatal hernia. No esophageal wall thickening. No suspicious thyroid nodule. Lungs/Pleura: Minimal emphysema. No pulmonary nodule or mass. Mild dependent atelectasis. No pleural effusion or thickening. The trachea and central airways are clear. Musculoskeletal: No evidence of lytic or blastic osseous lesion. Upper thoracic scoliosis. No chest wall soft tissue abnormalities. CT ABDOMEN  PELVIS FINDINGS Hepatobiliary: No focal liver abnormality is seen. No gallstones, gallbladder wall thickening, or biliary dilatation. Pancreas: No evidence of pancreatic mass. No ductal dilatation or inflammation. Spleen: Normal in size without focal abnormality. Adrenals/Urinary Tract: No adrenal nodule. No renal mass or renal calculi. No hydronephrosis. No bladder wall thickening or evidence of bladder mass. Stomach/Bowel: Small hiatal hernia. There is no gastric wall thickening. No evidence of small bowel mass, obstruction or inflammation. Moderate volume of stool in the colon. No obvious colonic mass. Distal descending and sigmoid colonic diverticulosis without diverticulitis. Vascular/Lymphatic: Normal caliber abdominal aorta with mild atherosclerosis. Portal vein is patent. No suspicious lymphadenopathy.  Reproductive: Prostate is unremarkable. Other: Right inguinal hernia repair. No ascites. No omental thickening. No subcutaneous lesion. Musculoskeletal: Peripherally sclerotic lucency within the left iliac bone is unchanged from 2021 and considered benign. No suspicious bone lesion. Multilevel degenerative change in the spine. 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 IMPRESSION: 1. No evidence of primary malignancy or metastatic disease in the chest, abdomen, or pelvis. 2. Small hiatal hernia. Colonic diverticulosis without diverticulitis. 3. 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 Aortic Atherosclerosis (ICD10-I70.0) and Emphysema (ICD10-J43.9). Electronically Signed   By: Andrea Gasman M.D.   On: 07/25/2024 23:08   MR Cervical Spine W and Wo Contrast Result Date: 07/25/2024 CLINICAL DATA:  Provided history: Ataxia, nontraumatic, cervical pathology suspected. EXAM: MRI CERVICAL SPINE WITHOUT AND WITH CONTRAST TECHNIQUE: Multiplanar and multiecho pulse sequences of the cervical spine, to include the craniocervical junction and cervicothoracic junction, were obtained without and with intravenous contrast. CONTRAST:  6mL GADAVIST  GADOBUTROL  1 MMOL/ML IV SOLN COMPARISON:  None. FINDINGS: Alignment: Levocurvature of the cervical spine. Nonspecific straightening of the expected cervical lordosis. 4 mm C3-C4 grade 1 anterolisthesis. Slight C4-C5 grade 1 anterolisthesis. 5 mm C7-T1 grade 1 anterolisthesis. Vertebrae: Multilevel degenerative endplate irregularity. Mild degenerative endplate edema at R5-R4, C5-C6 and C6-C7. Small hemangioma within the C3 vertebral body. Facet ankylosis on the left at C2-C3 and on the left at C7-T1. Multifocal marrow edema and enhancement within the posterior elements, likely degenerative and related to facet arthropathy Cord: No signal abnormality identified within the cervical spinal cord. No pathologic spinal cord enhancement. Multilevel  spinal cord flattening as described below. Posterior Fossa, vertebral arteries, paraspinal tissues: Posterior fossa assessed on same-day brain MRI. Flow voids preserved within visible portions of the cervical vertebral arteries. No paraspinal mass or collection. Nonspecific ill-defined edema and enhancement within the paraspinal soft tissues on the right at the C4-C7 levels. Disc levels: Multilevel disc degeneration, greatest at C4-C5, C5-C6 and C6-C7 (moderate-to-advanced at these levels). Also of note, disc degeneration is moderate at C7-T1. Developmentally narrow cervical spinal canal due to short pedicles. C2-C3: Facet ankylosis on the left. No significant disc herniation or stenosis. C3-C4: Grade 1 anterolisthesis. Uncovertebral hypertrophy on the right. Advanced facet arthropathy on the right. Mild ligamentum flavum thickening. Mild spinal canal stenosis. Severe right neural foraminal narrowing. C4-C5: Grade 1 anterolisthesis. Posterior disc osteophyte complex with bilateral disc osteophyte ridge/uncinate hypertrophy. Moderate facet arthropathy (greater on the left). Ligamentum flavum thickening. Severe spinal canal stenosis with spinal cord flattening. Severe bilateral neural foraminal narrowing. C5-C6: Posterior disc osteophyte complex with bilateral disc osteophyte ridge/uncinate hypertrophy. Facet arthropathy (greater on the right and moderate-to-advanced on the right). Mild ligamentum flavum thickening. Mild spinal canal stenosis. Moderate-to-severe bilateral neural foraminal narrowing (greater on the left). C6-C7: Posterior disc osteophyte complex with bilateral disc osteophyte ridge/uncinate hypertrophy. Mild facet arthropathy. No significant spinal  canal stenosis. Bilateral neural foraminal narrowing (mild-to-moderate right, moderate-to-severe left). C7-T1: Grade 1 anterolisthesis. Shallow disc bulge. Moderate facet arthropathy on the right. Facet ankylosis on the left. Mild ligamentum flavum  thickening. Mild spinal canal stenosis. The disc bulge slightly flattens the ventral aspect of the spinal cord. Severe bilateral neural foraminal narrowing. IMPRESSION: 1. Cervical spondylosis as outlined within the body of the report. 2. At C4-C5, there is multifactorial severe spinal canal stenosis (with spinal cord flattening). Severe bilateral neural foraminal narrowing. 3. No more than mild spinal canal stenosis at the remaining levels. 4. Additional sites of foraminal stenosis, greatest on the right at C3-C4 (severe), bilaterally at C5-C6 (moderate-to-severe), on the left at C6-C7 (moderate-to-severe) and bilaterally at C7-T1 (severe). 5. Disc degeneration is greatest at C4-C5, C5-C6 and C6-C7 (moderate-to-advanced in severity with mild degenerative endplate edema at these levels). 6. Grade 1 anterolisthesis at C3-C4, C4-C5 and C7-T1. 7. Levocurvature of the cervical spine. 8. Multifocal marrow edema and enhancement within the posterior elements, likely degenerative and related to facet arthropathy. 9. Facet ankylosis on the left at C2-C3 and on the left at C7-T1. 10. Nonspecific edema and enhancement within the right paraspinal soft tissues at the C4-C7 levels. Electronically Signed   By: Rockey Childs D.O.   On: 07/25/2024 19:32   MR THORACIC SPINE W WO CONTRAST Result Date: 07/25/2024 EXAM: MRI THORACIC SPINE WITH AND WITHOUT INTRAVENOUS CONTRAST 07/25/2024 05:42:39 PM TECHNIQUE: Multiplanar multisequence MRI of the thoracic spine was performed with and without the administration of intravenous contrast. COMPARISON: None available. CLINICAL HISTORY: Bone lesion, thoracic spine, incidental. Patient reports increasing weakness in his legs, multiple falls, and stiffness in his legs. History of polio with prior multiple surgeries. Prescribed gabapentin  with no significant help. FINDINGS: BONES AND ALIGNMENT: Mild thoracic levoscoliosis. Normal vertebral body heights. Bone marrow signal is unremarkable. No  abnormal enhancement. SPINAL CORD: Normal spinal cord volume. Normal spinal cord signal. SOFT TISSUES: Unremarkable. DEGENERATIVE CHANGES: Minimal degenerative disc disease. No spinal canal stenosis or neural foraminal narrowing. IMPRESSION: 1. No spinal canal stenosis or neural foraminal narrowing in the thoracic spine. 2. Mild thoracic levoscoliosis. Electronically signed by: Franky Stanford MD 07/25/2024 07:13 PM EDT RP Workstation: [id number]   MR Brain W and Wo Contrast Result Date: 07/25/2024 EXAM: MRI BRAIN WITH AND WITHOUT CONTRAST 07/25/2024 05:42:39 PM TECHNIQUE: Multiplanar multisequence MRI of the head/brain was performed with and without the administration of intravenous contrast. COMPARISON: CT head 06/06/2024 CLINICAL HISTORY: Headache, increasing frequency or severity. Patient reports increasing weakness in his legs and multiple falls. History of polio with prior multiple surgeries. FINDINGS: BRAIN AND VENTRICLES: There is a peripheral enhancing mass centered in the right frontoparietal white matter involving the centrum semiovale and extending into the periventricular white matter and corona radiata. The mass measures 4.5 x 4.5 x 2.9 cm. The mass abuts the ependymal surface of the right lateral ventricle with associated mass effect on the ventricle. Mild diffusion restriction. There is associated susceptibility along the periphery of the mass. Surrounding T2/FLAIR hyperintensity suggestive of peritumoral edema which involves the posterior aspect of the right centrum semiovale and the right periventricular white matter. Additional T2/FLAIR hyperintensity in the periventricular and subcortical white matter suggestive of chronic microvascular ischemic changes. There is mild cerebral volume loss. No significant midline shift. No evidence of acute infarct. ORBITS: No acute abnormality. SINUSES: Mild mucosal thickening in the left maxillary sinus. Chronic deformity of the right lamina papyracea. BONES AND  SOFT TISSUES: Normal bone marrow signal and enhancement. No  acute soft tissue abnormality. IMPRESSION: 1. Peripheral enhancing mass in the right frontoparietal lobes measuring 4.5 x 4.5 x 2.9 cm, with mild associated peritumoral edema. The mass abuts the ependymal surface of the right lateral ventricle, causing mass effect without significant midline shift. 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 2. Additional T2/FLAIR hyperintensity in the periventricular and subcortical white matter, suggestive of chronic microvascular ischemic changes. 3. No acute infarct. Electronically signed by: Donnice Mania MD 07/25/2024 06:59 PM EDT RP Workstation: [id number]   CT HEAD WO CONTRAST ( ) Result Date: 07/25/2024 CLINICAL DATA:  Provided history: Headache, new onset. EXAM: CT HEAD WITHOUT CONTRAST TECHNIQUE: Contiguous axial images were obtained from the base of the skull through the vertex without intravenous contrast. RADIATION DOSE REDUCTION: This exam was performed according to the departmental dose-optimization program which includes automated exposure control, adjustment of the mA and/or kV according to patient size and/or use of iterative reconstruction technique. COMPARISON:  Head CT 05/07/2024. FINDINGS: Brain: Since the head CT of 05/07/2024, interval increase in size of a lesion centered within the right frontoparietal white matter, now measuring 4.9 x 3.7 cm (for instance as seen on series 5, image 23) (series 2, image 20). The lesion also appears to extend to involve the callosal body (for instance as seen on series 4, image 36). This is most suspicious for a mass. Centrally, the lesion is hypodense. There is ill-defined hyperdensity along the which could reflect hypercellularity, mineralization and/or associated hemorrhage. Progressive mass effect with partial effacement of the right lateral ventricle. No midline shift. No demarcated cortical infarct. No extra-axial fluid  collection. Vascular: No hyperdense vessel.  Atherosclerotic calcifications. Skull: No calvarial fracture or aggressive osseous lesion. Visible sinuses/orbits: No mass or acute finding within the imaged orbits. Chronic medially displaced fracture deformity of the right lamina papyracea. Mild mucosal thickening within the right frontal sinus inferiorly. IMPRESSION: 4.9 x 3.7 cm lesion centered within the right frontoparietal white matter, as described and increased in size since the head CT of 05/07/2024. This is most suspicious for a mass (such as a high-grade primary CNS neoplasm, metastatic lesion or lymphoma). However, a brain MRI (with and without contrast) is recommended for further characterization. Progressive mass effect with partial effacement of the right lateral ventricle. No midline shift. Ill-defined hyperdensity at the periphery of the lesion which could reflect hypercellularity, mineralization and/or hemorrhage. Electronically Signed   By: Rockey Childs D.O.   On: 07/25/2024 16:20    Pathology: SURGICAL PATHOLOGY  * THIS IS AN ADDENDUM REPORT *  CASE: [id number]  PATIENT: Benyamin Mcnelly  Surgical Pathology Report  *Addendum *   Reason for Addendum #1:  Additional clinical/test information   Clinical History: brain mass (cm)   FINAL MICROSCOPIC DIAGNOSIS:   A. BRAIN TUMOR, BIOPSY:       High-grade glioma, WHO grade 4.   B. BRAIN TUMOR, BIOPSY:       High-grade glioma, WHO grade 4.   COMMENT:   Histological sections show predominant necrotic tissue with limited  viable neural tissue showing hypercellular glial proliferation with  vascular proliferation consistent with high-grade glioma. The main  differential diagnosis includes astrocytoma, IDH-mutant and  glioblastoma, IDH-wild type. IDH mutation test will be performed for  further classification.   The case was peer-reviewed by Dr. Reed who agrees with the  diagnosis. Dr. Janjua was notified via Epic massaging  at 9:15 am on  08/01/2024.    Assessment/Plan Glioblastoma (HCC)  We appreciate the opportunity to participate in  the care of Zyier C Cajuste.  He presents with clinical and radiographic syndrome consistent with grade 4 glioma, likely glioblastoma.  IDH status was indeterminate from molecular testing due to lack of tissue.  We had an extensive conversation with him and his family regarding pathology, prognosis, and available treatment pathways.    We ultimately recommended proceeding with course of intensity modulated radiation therapy and concurrent daily Temozolomide.  Radiation will be administered Mon-Fri over 6 weeks, Temodar will be dosed at 75mg /m2 to be given daily over 42 days.  We reviewed side effects of temodar, including fatigue, nausea/vomiting, constipation, and cytopenias.  Informed consent was verbally obtained at bedside to proceed with oral chemotherapy.  Chemotherapy should be held for the following:  ANC less than 1,000  Platelets less than 100,000  LFT or creatinine greater than 2x ULN  If clinical concerns/contraindications develop  Every 2 weeks during radiation, labs will be checked accompanied by a clinical evaluation in the brain tumor clinic.  Decadron  should be reduced to 2mg  twice per day if tolerated.  Screening for potential clinical trials was performed and discussed using eligibility criteria for active protocols at [facility], loco-regional tertiary centers, as well as national database available on GroundTransfer.at.    The patient is not a candidate for a research protocol at this time due to patient preference.   We also discussed and patient consented for additional tumor profiling and sequencing through [facility].  Advanced tumor profiling could help identify actionable mutation for targeted therapy and lead to direct clinical benefit.     We spent twenty additional minutes teaching regarding the natural history, biology, and historical  experience in the treatment of brain tumors. We then discussed in detail the current recommendations for therapy focusing on the mode of administration, mechanism of action, anticipated toxicities, and quality of life issues associated with this plan. We also provided teaching sheets for the patient to take home as an additional resource.  All questions were answered. The patient knows to call the clinic with any problems, questions or concerns. No barriers to learning were detected.  The total time spent in the encounter was 60 minutes and more than 50% was on counseling and review of test results   Arthea MARLA Manns, MD Medical Director of Neuro-Oncology [facility] [place] 08/21/24 3:36 PM

## 2024-08-22 ENCOUNTER — Telehealth: Payer: Self-pay | Admitting: Radiation Therapy

## 2024-08-22 ENCOUNTER — Other Ambulatory Visit: Payer: Self-pay | Admitting: Radiation Therapy

## 2024-08-22 ENCOUNTER — Other Ambulatory Visit: Payer: Self-pay

## 2024-08-22 ENCOUNTER — Inpatient Hospital Stay: Attending: Internal Medicine | Admitting: Licensed Clinical Social Worker

## 2024-08-22 DIAGNOSIS — C719 Malignant neoplasm of brain, unspecified: Secondary | ICD-10-CM

## 2024-08-22 NOTE — Progress Notes (Signed)
 Location/Histology of Brain Tumor:  Right Frontal Glioblastoma  Patient presented with symptoms of:   08/21/2024 Vaslow, MD Progressive left sided weakness. His leg is more affected than his arm limiting his ability to walk on his own.   Past or anticipated interventions, if any, per neurosurgery:  Craniotomy  Past or anticipated interventions, if any, per medical oncology:  08/21/2024 Buckley, MD   Dose of Decadron , if applicable: 2 mg three times per day  Recent neurologic symptoms, if any:  Seizures: None Headaches: None Nausea: None Dizziness/ataxia: None Difficulty with hand coordination: Yes Focal numbness/weakness: Yes Visual deficits/changes: None Confusion/Memory deficits: None  Painful bone metastases at present, if any: None  SAFETY ISSUES: Prior radiation? None Pacemaker/ICD? None Possible current pregnancy? None Is the patient on methotrexate? None  Additional Complaints / other details: None

## 2024-08-22 NOTE — Progress Notes (Signed)
 [facility] Clinical Social Work  Initial Assessment   Joshua Harmon is a 63 y.o. year old male contacted pt's significant other by phone. Clinical Social Work was referred by medical provider for assessment of psychosocial needs.   SDOH (Social Determinants of Health) assessments performed: Yes   SDOH Screenings   Food Insecurity: No Food Insecurity (07/27/2024)  Recent Concern: Food Insecurity - Food Insecurity Present (05/07/2024)   Received from [facility]  Housing: Low Risk  (07/27/2024)  Recent Concern: Housing - High Risk (05/07/2024)   Received from [facility]  Transportation Needs: No Transportation Needs (07/27/2024)  Recent Concern: Transportation Needs - Unmet Transportation Needs (05/07/2024)   Received from [facility]  Utilities: Not At Risk (07/27/2024)  Recent Concern: Utilities - At Risk (05/07/2024)   Received from [facility]  Financial Resource Strain: Medium Risk (05/07/2024)   Received from [facility]  Social Connections: Unknown (07/25/2024)  Tobacco Use: High Risk (07/30/2024)    PHQ 2/9:     No data to display           Distress Screen completed: No     No data to display            Family/Social Information:  Housing Arrangement: patient lives with his significant other, Joshua Harmon.  Pt was recently hospitalized due to left sided hemiparisis and found to have a brain tumor.  Pt was discharged to acute rehab and discharged home yesterday w/ DME and a home care referral. Per Joshua Harmon she wanted to bring pt home for at least a little bit as she felt he would benefit from being home and out of the hospital environment.  Joshua Harmon is uncertain at this time if she will be able to provide the level of care pt requires, but would like to try.   Family members/support persons in your life? Per Boby, she has a daughter that is able to assist as needed, but support is  limited. Transportation concerns: yes, pt currently requires stretcher transport due to left sided hemiparisis.  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  Per Joshua Harmon paperwork was submitted yesterday for short term disability benefits, but she does not know yet if he will qualify.  Income source: Employment Financial concerns: Yes, current concerns Type of concern: Utilities and Rent/ mortgage Food access concerns: no Religious or spiritual practice: Not known Advanced directives: No Services Currently in place:  home care and DME was delivered from acute rehab  Coping/ Adjustment to diagnosis: Patient understands treatment plan and what happens next? yes Concerns about diagnosis and/or treatment: Losing my job and/or losing income, Overwhelmed by information, Afraid of cancer, How will I care for myself, and Quality of life Patient reported stressors: Finances, Transportation, Anxiety/ nervousness, and Adjusting to my illness Hopes and/or priorities: pt's priority is to start treatment w/ the hope of positive results. Patient enjoys not addressed Current coping skills/ strengths: Motivation for treatment/growth  and Supportive family/friends     SUMMARY: Current SDOH Barriers:  Financial constraints related to loss of income, Limited social support, and Transportation  Clinical Social Work Clinical Goal(s):  Explore community resource options for unmet needs related to:  Corporate treasurer  and Transportation  Interventions: Discussed common feeling and emotions when being diagnosed with cancer, and the importance of support during treatment Informed patient of the support team roles and support services at [facility] Provided CSW contact information  and encouraged patient to call with any questions or concerns Pt has been referred to transportation by radiation RN.  CSW encouraged Joshua Harmon to assist pt w/ applying for SNAP benefits to qualify for the  [employer].  Joshua Harmon informed of the availability of supportive counseling. Brain tumor assessments deferred at this time due to complications w/ transportation and pt's ability to participate.   Follow Up Plan: CSW will f/u w/ Joshua Harmon in 1 week to check in.  Patient verbalizes understanding of plan: Yes    Devere JONELLE Manna, LCSW Clinical Social Worker [facility]

## 2024-08-22 NOTE — Telephone Encounter (Signed)
 I spoke with the patient and his significant other about the upcoming simulation appointment with Dr. Izell on 10/10. They are aware of the telephone consult with Dr. Izell on 10/3 and look forward to getting things started for his treatment.   A referral has also been placed for social work, requesting extra support and assistance with transportation to his appointments.   Joshua Harmon R.T.(R)(T) Radiation Special Procedures Lead

## 2024-08-22 NOTE — Progress Notes (Signed)
 Inpatient Rehabilitation Care Coordinator Discharge Note   Patient Details  Name: Joshua Harmon MRN: [id number] Date of Birth: [birth year]   Discharge location: D/c to home with s/o Joshua Harmon  Length of Stay: 17 days  Discharge activity level: ambulatory level ModA +2.  Patient's care partner requires assistance to provide the necessary physical and cognitive assistance at discharge.  Home/community participation: Limited  Patient response un:Yzjouy Literacy - How often do you need to have someone help you when you read instructions, pamphlets, or other written material from your doctor or pharmacy?: Rarely  Patient response un:Dnrpjo Isolation - How often do you feel lonely or isolated from those around you?: Never  Services provided included: RD, MD, PT, SLP, RN, TR, SW, Neuropsych, Pharmacy, CM, OT  Financial Services:  Field seismologist Utilized: Barrister's clerk  Choices offered to/list presented to: patient s/o  Follow-up services arranged:  Home Health, DME, Patient/Family has no preference for HH/DME agencies Home Health Agency: [facility] for HHPT/OT/SLP/SN/SW    DME : [facility] for hosiptal bed, w/c, and 3in1 BSC    Patient response to transportation need: Is the patient able to respond to transportation needs?: Yes In the past 12 months, has lack of transportation kept you from medical appointments or from getting medications?: No In the past 12 months, has lack of transportation kept you from meetings, work, or from getting things needed for daily living?: No   Patient/Family verbalized understanding of follow-up arrangements:  Yes  Individual responsible for coordination of the follow-up plan: contact pt s/o Joshua Harmon  Confirmed correct DME delivered: Joshua Harmon Joshua Harmon Joshua Harmon 08/22/2024    Comments (or additional information):fam edu completed  Summary of Stay    Date/Time Discharge Planning CSW  08/13/24 1457 Pt will d/c to home with s/o as primary  caregiver. SW will confirm there are no barriers to discharge. AAC  08/07/24 0940 Pt will d/c to home with s/o as primary caregiver. SW will confirm there are no barriers to discharge. AAC       Joshua Harmon Joshua Harmon

## 2024-08-23 NOTE — Progress Notes (Signed)
 Radiation Oncology         [phone] ________________________________  Initial Outpatient Consultation  Name: BRONTE KROPF MRN: [id number]  Date: 08/24/2024  DOB: 1961/05/28  RR:Sjqpmnc, Parris LABOR, MD  Rosslyn Dino HERO, MD   REFERRING PHYSICIAN: Rosslyn Dino HERO, MD  DIAGNOSIS: No diagnosis found.   Cancer Staging  No matching staging information was found for the patient.  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 male with a PMH of polio in his youth with bilateral LE surgeries. He presents today for discuss the role of radiation therapy in management of his recently diagnosed glioblastoma.   He initially presented to the ED on 05/07/24 with 3 days of bilateral leg weakness, some tingling and numbness in his lower extremities, and poor balance. A CT of the head was subsequently performed in the ED which revealed a rounded 1.7 cm mass-like hyperdensity along the frontal horn of the lateral ventricle. Further work-up consisting of a brain MRI was recommended at that time, however the patient opted for discharge AMA due to financial constraints.   He returned to the ED On 06/10/24 with worsening ongoing stiffness of his lower extremities which he attributed to his history of polio. Given that he denied any progression of his bilateral leg weakness, numbness, and poor balance (relating to his prior hospital encounter and CT findings), he was started on gabapentin  and discharged with a referral to PT placed.   He later presented to the [facility] ED on 07/25/24 with c/o headaches, leg stiffness, unsteadiness, and several weeks of increased fall episodes. A CT of the head was performed in the ED which showed a significant interval increase in size of the mass-like lesion within the right frontoparietal white matter, measuring 4.9 cm,  resulting in progressive mass effect with partial effacement of the right lateral ventricle but no evidence of midline shift. An ill-defined hyper density at the periphery of the lesion was also demonstrated; possibly reflecting hypercellularity, mineralization and/or hemorrhage.  He was accordingly admitted for further management and underwent an MRI of the brain upon admission which showed: the peripheral enhancing mass in the right frontoparietal lobe measuring 4.5 x 4.5 x 2.9 cm, with mild associated peritumoral edema, abutting the ependymal surface of the right lateral ventricle, and resulting in mass effect without significant midline shift. An additional T2/FLAIR hyperintensity in the periventricular and subcortical white matter was also demonstrated, suggestive of chronic microvascular ischemic changes.  Additional imaging performed upon admission (on 07/25/24) included:  -- MRI of the cervical spine which showed: multifactorial spinal and foraminal canal stenosis throughout much of the cervical spine, with disc degeneration greatest at C4-5 and from C7-T1. -- MRI of the thoracic spine showed no evidence of spinal canal stenosis or neural foraminal narrowing in the thoracic spine.  -- CT CAP with contrast thankfully showed no evidence of primary malignancy or metastatic disease in the chest, abdomen, or pelvis.  He was evaluated by neurosurgery and promptly transferred to [facility] on 07/26/24 for further work-up and management. He was then evaluated by Dr. Janjua and opted to proceed with biopsies x2 of the brain tumor on 07/30/24. Pathology showed findings consistent with WHO grade 4 high-grade glioma. (Of note: Molecular tests on IDH1 mutation and IDH1 mutation were performed at [facility]. Testing did not detect a IDH1 or IDH2 mutation, favoring the diagnosis of glioblastoma,  IDH-wild type). His hospital course otherwise consisted of post-op care and he was started on decadron .  A post-operative  head CT was performed on 07/31/24 which showed post-op changes s/p interval right frontal burr hole craniotomy for biopsy of the known right cerebral mass. A small volume postoperative hemorrhage along the biopsy tract and within the resection cavity itself was also demonstrated. Imaging otherwise showed no other complicating features or acute intracranial findings.   Given his impaired functional ability (particularly on his left side), he was discharged to IP rehab on 08/03/24.   In most recent history, he was seen in consultation by Dr. Buckley on 08/21/24. Dr. Buckley has recommended intensity modulated radiation therapy with concurrent daily Temozolomide which we will discuss in detail together today. He will also continue to take his decadron  at 2 mg twice daily based on Dr. Eward recommendations.   Other pertinent imaging performed thus far includes a head CT on 08/21/24 (indicated by an unwitnessed fall episode) which showed no acute findings intracranial findings. The right cerebral mass (consistent with GBM) was also demonstrated and appeared stable in size with no significant midline shift present. Surrounding mass effect also appeared stable on this study.    PREVIOUS RADIATION THERAPY: No  PAST MEDICAL HISTORY:  has a past medical history of GERD (gastroesophageal reflux disease).    PAST SURGICAL HISTORY: Past Surgical History:  Procedure Laterality Date   APPLICATION OF CRANIAL NAVIGATION Right 07/30/2024   Procedure: COMPUTER-ASSISTED NAVIGATION, FOR CRANIAL PROCEDURE;  Surgeon: Rosslyn Dino HERO, MD;  Location: [facility] OR;  Service: Neurosurgery;  Laterality: Right;  CRANIAL NAVIGATION   LEG SURGERY Bilateral as a child   STERIOTACTIC STIMULATOR INSERTION Right 07/30/2024   Procedure: RIGHT STEREOTACTIC BIOPSY;  Surgeon: Rosslyn Dino HERO, MD;  Location[facility];  Service: Neurosurgery;  Laterality: Right;  RIGHT STERIOTACTIC BRAIN BIOPSY   XI ROBOTIC ASSISTED INGUINAL HERNIA REPAIR WITH  MESH Right 02/19/2020   Procedure: XI ROBOTIC ASSISTED Right INGUINAL HERNIA REPAIR WITH MESH;  Surgeon: Jordis Laneta FALCON, MD;  Location: [facility] ORS;  Service: General;  Laterality: Right;    FAMILY HISTORY: Family history is unknown by patient.  SOCIAL HISTORY:  reports that he has been smoking cigarettes. He has never used smokeless tobacco. He reports that he does not currently use alcohol after a past usage of about 10.0 standard drinks of alcohol per week. He reports that he does not currently use drugs.  ALLERGIES: Patient has no known allergies.  MEDICATIONS:  Current Outpatient Medications  Medication Sig Dispense Refill   acetaminophen  (TYLENOL ) 325 MG tablet Take 2 tablets (650 mg total) by mouth every 6 (six) hours as needed for mild pain (pain score 1-3) or fever (or Fever >/= 101).     Baclofen  5 MG TABS Take 1 tablet (5 mg total) by mouth 2 (two) times daily. 60 tablet 0   butalbital -acetaminophen -caffeine  (FIORICET) 50-325-40 MG tablet Take 2 tablets by mouth every 4 (four) hours as needed for headache (moderate pain). 14 tablet 0   dexamethasone  (DECADRON ) 2 MG tablet Take 1 tablet (2 mg total) by mouth 2 (two) times daily.     magnesium gluconate (MAGONATE) 500 (27 Mg) MG TABS tablet Take 0.5 tablets (250 mg total) by mouth daily. 30 tablet 0   methylphenidate  (RITALIN ) 5 MG tablet Take 1 tablet (5 mg total) by mouth 2 (two) times daily with breakfast and lunch. 60 tablet 0   [START ON 08/27/2024] ondansetron  (ZOFRAN ) 8 MG tablet Take 1 tablet (8  mg total) by mouth every 8 (eight) hours as needed for nausea or vomiting. May take 30-60 minutes prior to Temodar administration if nausea/vomiting occurs as needed. 30 tablet 1   pantoprazole  (PROTONIX ) 40 MG tablet Take 1 tablet (40 mg total) by mouth daily. 30 tablet 0   polyethylene glycol powder (GLYCOLAX /MIRALAX ) 17 GM/SCOOP powder Dissolve 1 capful (17g) in 4-8 ounces of liquid and take by mouth daily as needed for moderate  constipation. 238 g 0   senna-docusate (SENOKOT-S) 8.6-50 MG tablet Take 1 tablet by mouth at bedtime as needed for mild constipation. 30 tablet 0   [START ON 08/27/2024] temozolomide (TEMODAR) 140 MG capsule Take 1 capsule (140 mg total) by mouth daily. May take on an empty stomach to decrease nausea & vomiting. 42 capsule 0   traZODone  (DESYREL ) 50 MG tablet Take 1 tablet (50 mg total) by mouth at bedtime as needed for sleep. 30 tablet 0   No current facility-administered medications for this encounter.    REVIEW OF SYSTEMS:  Notable for that above.   PHYSICAL EXAM:  vitals were not taken for this visit.   General: Alert and oriented, in no acute distress *** HEENT: Head is normocephalic. Extraocular movements are intact. Oropharynx is clear. Neck: Neck is supple, no palpable cervical or supraclavicular lymphadenopathy. Heart: Regular in rate and rhythm with no murmurs, rubs, or gallops. Chest: Clear to auscultation bilaterally, with no rhonchi, wheezes, or rales. Abdomen: Soft, nontender, nondistended, with no rigidity or guarding. Extremities: No cyanosis or edema. Lymphatics: see Neck Exam Skin: No concerning lesions. Musculoskeletal: symmetric strength and muscle tone throughout. Neurologic: Cranial nerves II through XII are grossly intact. No obvious focalities. Speech is fluent. Coordination is intact. Psychiatric: Judgment and insight are intact. Affect is appropriate.   ECOG = ***  0 - Asymptomatic (Fully active, able to carry on all predisease activities without restriction)  1 - Symptomatic but completely ambulatory (Restricted in physically strenuous activity but ambulatory and able to carry out work of a light or sedentary nature. For example, light housework, office work)  2 - Symptomatic, <50% in bed during the day (Ambulatory and capable of all self care but unable to carry out any work activities. Up and about more than 50% of waking hours)  3 - Symptomatic, >50% in  bed, but not bedbound (Capable of only limited self-care, confined to bed or chair 50% or more of waking hours)  4 - Bedbound (Completely disabled. Cannot carry on any self-care. Totally confined to bed or chair)  5 - Death   Raylene MM, Creech RH, Tormey DC, et al. [phone]). Toxicity and response criteria of the [facility]. Am. DOROTHA Bridges. Oncol. 5 (6): 649-55   LABORATORY DATA:  Lab Results  Component Value Date   WBC 16.9 (H) 08/13/2024   HGB 15.9 08/13/2024   HCT 47.6 08/13/2024   MCV 91.9 08/13/2024   PLT 344 08/13/2024   CMP     Component Value Date/Time   NA 134 (L) 08/18/2024 0502   K 4.5 08/18/2024 0502   CL 99 08/18/2024 0502   CO2 25 08/18/2024 0502   GLUCOSE 120 (H) 08/18/2024 0502   BUN 16 08/18/2024 0502   CREATININE 0.77 08/18/2024 0502   CALCIUM 8.7 (L) 08/18/2024 0502   PROT 6.3 (L) 08/13/2024 1010   ALBUMIN 3.1 (L) 08/13/2024 1010   AST 22 08/13/2024 1010   ALT 48 (H) 08/13/2024 1010   ALKPHOS 57 08/13/2024 1010   BILITOT 0.7  08/13/2024 1010   GFRNONAA >60 08/18/2024 0502         RADIOGRAPHY: CT HEAD WO CONTRAST ( ) Result Date: 08/21/2024 CLINICAL DATA:  Initial evaluation for un witnessed fall. EXAM: CT HEAD WITHOUT CONTRAST TECHNIQUE: Contiguous axial images were obtained from the base of the skull through the vertex without intravenous contrast. RADIATION DOSE REDUCTION: This exam was performed according to the departmental dose-optimization program which includes automated exposure control, adjustment of the mA and/or kV according to patient size and/or use of iterative reconstruction technique. COMPARISON:  CT from 07/31/2024. FINDINGS: Brain: Patient's known right cerebral mass involving the right frontal parietal region again seen, grossly similar in size as compared to recent exams. Previously seen post biopsy hemorrhage has largely resolved. Surrounding edema is relatively similar. No other acute intracranial hemorrhage. No  acute large vessel territory infarct. No other mass lesion or mass effect. No significant midline shift. No extra-axial fluid collection. Vascular: No abnormal hyperdense vessel. Skull: Scalp soft tissues demonstrate no acute finding. Prior right frontal burr hole craniotomy noted. Calvarium otherwise intact. Sinuses/Orbits: Globes orbital soft tissues demonstrate no acute finding. Remote posttraumatic defect noted at the right lamina papyracea. Paranasal sinuses are largely clear. No significant mastoid effusion. Other: None. IMPRESSION: 1. Relatively stable size of patient's known right cerebral mass/GBM, with interval resolution of previously seen post biopsy hemorrhage. Surrounding mass effect is relatively stable. No significant midline shift. 2. No other new acute intracranial abnormality. Electronically Signed   By: Morene Hoard M.D.   On: 08/21/2024 01:12   DG Chest 2 View Result Date: 08/04/2024 EXAM: 2 VIEW(S) XRAY OF THE CHEST 08/04/2024 12:15:00 PM COMPARISON: None available. CLINICAL HISTORY: Pt order states leukocytosis. FINDINGS: LUNGS AND PLEURA: No focal pulmonary opacity. No pulmonary edema. No pleural effusion. No pneumothorax. HEART AND MEDIASTINUM: No acute abnormality of the cardiac and mediastinal silhouettes. Hiatal hernia. BONES AND SOFT TISSUES: No acute osseous abnormality. IMPRESSION: 1. No acute process. 2. Hiatal hernia. Electronically signed by: Katheleen Faes MD 08/04/2024 04:02 PM EDT RP Workstation: [id number]   CT HEAD WO CONTRAST Result Date: 07/31/2024 CLINICAL DATA:  Initial postoperative evaluation. EXAM: CT HEAD WITHOUT CONTRAST TECHNIQUE: Contiguous axial images were obtained from the base of the skull through the vertex without intravenous contrast. RADIATION DOSE REDUCTION: This exam was performed according to the departmental dose-optimization program which includes automated exposure control, adjustment of the mA and/or kV according to patient size and/or use  of iterative reconstruction technique. COMPARISON:  Prior MRI from 07/25/2024. FINDINGS: Brain: Postoperative changes from interval right frontal burr hole craniotomy for presumed biopsy of previously identified right cerebral mass. Small amount of postoperative pneumocephalus overlies the right cerebral convexity. Small volume hemorrhage seen along the biopsy tract at the level of the right frontal operculum. Hemorrhage within the biopsy cavity/mass itself measures 1.8 x 1.4 x 1.8 cm (series 2, image 20). The underlying mass itself is otherwise grossly stable. Associated regional mass effect with trace right-to-left shift at the septum pellucidum, not significantly changed. No visible intraventricular hemorrhage. No other acute intracranial hemorrhage. No acute large vessel territory infarct. No other mass lesion or mass effect. No hydrocephalus or extra-axial fluid collection. Vascular: No abnormal hyperdense vessel. Scattered calcified atherosclerosis present at the skull base. Skull: Post craniotomy changes at the right frontal scalp. Skin staples in place. No adverse features. Sinuses/Orbits: Globes orbital soft tissues demonstrate no acute finding. Remote posttraumatic defect noted at the right lamina papyracea. Paranasal sinuses are largely clear. No mastoid effusion. Other: None.  IMPRESSION: 1. Postoperative changes from interval right frontal burr hole craniotomy for biopsy of patient's known right cerebral mass. Small volume postoperative hemorrhage along the biopsy tract and within the resection cavity itself as detailed above. No other complicating features. 2. No other new acute intracranial abnormality. Electronically Signed   By: Morene Hoard M.D.   On: 07/31/2024 02:01   CT CHEST ABDOMEN PELVIS W CONTRAST Result Date: 07/25/2024 CLINICAL DATA:  Brain mass, assess for metastatic disease. EXAM: CT CHEST, ABDOMEN, AND PELVIS WITH CONTRAST TECHNIQUE: Multidetector CT imaging of the chest,  abdomen and pelvis was performed following the standard protocol during bolus administration of intravenous contrast. RADIATION DOSE REDUCTION: This exam was performed according to the departmental dose-optimization program which includes automated exposure control, adjustment of the mA and/or kV according to patient size and/or use of iterative reconstruction technique. CONTRAST:  OMNIPAQUE  IOHEXOL  300 MG/ML  SOLN COMPARISON:  Abdominopelvic CT 01/29/2020 FINDINGS: CT CHEST FINDINGS Cardiovascular: Normal heart size. No pericardial effusion. Minimal aortic atherosclerosis without aneurysm. Mediastinum/Nodes: No mediastinal or hilar adenopathy. Small hiatal hernia. No esophageal wall thickening. No suspicious thyroid nodule. Lungs/Pleura: Minimal emphysema. No pulmonary nodule or mass. Mild dependent atelectasis. No pleural effusion or thickening. The trachea and central airways are clear. Musculoskeletal: No evidence of lytic or blastic osseous lesion. Upper thoracic scoliosis. No chest wall soft tissue abnormalities. CT ABDOMEN PELVIS FINDINGS Hepatobiliary: No focal liver abnormality is seen. No gallstones, gallbladder wall thickening, or biliary dilatation. Pancreas: No evidence of pancreatic mass. No ductal dilatation or inflammation. Spleen: Normal in size without focal abnormality. Adrenals/Urinary Tract: No adrenal nodule. No renal mass or renal calculi. No hydronephrosis. No bladder wall thickening or evidence of bladder mass. Stomach/Bowel: Small hiatal hernia. There is no gastric wall thickening. No evidence of small bowel mass, obstruction or inflammation. Moderate volume of stool in the colon. No obvious colonic mass. Distal descending and sigmoid colonic diverticulosis without diverticulitis. Vascular/Lymphatic: Normal caliber abdominal aorta with mild atherosclerosis. Portal vein is patent. No suspicious lymphadenopathy. Reproductive: Prostate is unremarkable. Other: Right inguinal hernia  repair. No ascites. No omental thickening. No subcutaneous lesion. Musculoskeletal: Peripherally sclerotic lucency within the left iliac bone is unchanged from 2021 and considered benign. No suspicious bone lesion. Multilevel degenerative change in the spine. 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 IMPRESSION: 1. No evidence of primary malignancy or metastatic disease in the chest, abdomen, or pelvis. 2. Small hiatal hernia. Colonic diverticulosis without diverticulitis. 3. 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 Aortic Atherosclerosis (ICD10-I70.0) and Emphysema (ICD10-J43.9). Electronically Signed   By: Andrea Gasman M.D.   On: 07/25/2024 23:08   MR Cervical Spine W and Wo Contrast Result Date: 07/25/2024 CLINICAL DATA:  Provided history: Ataxia, nontraumatic, cervical pathology suspected. EXAM: MRI CERVICAL SPINE WITHOUT AND WITH CONTRAST TECHNIQUE: Multiplanar and multiecho pulse sequences of the cervical spine, to include the craniocervical junction and cervicothoracic junction, were obtained without and with intravenous contrast. CONTRAST:  6mL GADAVIST  GADOBUTROL  1 MMOL/ML IV SOLN COMPARISON:  None. FINDINGS: Alignment: Levocurvature of the cervical spine. Nonspecific straightening of the expected cervical lordosis. 4 mm C3-C4 grade 1 anterolisthesis. Slight C4-C5 grade 1 anterolisthesis. 5 mm C7-T1 grade 1 anterolisthesis. Vertebrae: Multilevel degenerative endplate irregularity. Mild degenerative endplate edema at R5-R4, C5-C6 and C6-C7. Small hemangioma within the C3 vertebral body. Facet ankylosis on the left at C2-C3 and on the left at C7-T1. Multifocal marrow edema and enhancement within the posterior elements, likely degenerative and related to facet  arthropathy Cord: No signal abnormality identified within the cervical spinal cord. No pathologic spinal cord enhancement. Multilevel spinal cord flattening as described below. Posterior Fossa, vertebral  arteries, paraspinal tissues: Posterior fossa assessed on same-day brain MRI. Flow voids preserved within visible portions of the cervical vertebral arteries. No paraspinal mass or collection. Nonspecific ill-defined edema and enhancement within the paraspinal soft tissues on the right at the C4-C7 levels. Disc levels: Multilevel disc degeneration, greatest at C4-C5, C5-C6 and C6-C7 (moderate-to-advanced at these levels). Also of note, disc degeneration is moderate at C7-T1. Developmentally narrow cervical spinal canal due to short pedicles. C2-C3: Facet ankylosis on the left. No significant disc herniation or stenosis. C3-C4: Grade 1 anterolisthesis. Uncovertebral hypertrophy on the right. Advanced facet arthropathy on the right. Mild ligamentum flavum thickening. Mild spinal canal stenosis. Severe right neural foraminal narrowing. C4-C5: Grade 1 anterolisthesis. Posterior disc osteophyte complex with bilateral disc osteophyte ridge/uncinate hypertrophy. Moderate facet arthropathy (greater on the left). Ligamentum flavum thickening. Severe spinal canal stenosis with spinal cord flattening. Severe bilateral neural foraminal narrowing. C5-C6: Posterior disc osteophyte complex with bilateral disc osteophyte ridge/uncinate hypertrophy. Facet arthropathy (greater on the right and moderate-to-advanced on the right). Mild ligamentum flavum thickening. Mild spinal canal stenosis. Moderate-to-severe bilateral neural foraminal narrowing (greater on the left). C6-C7: Posterior disc osteophyte complex with bilateral disc osteophyte ridge/uncinate hypertrophy. Mild facet arthropathy. No significant spinal canal stenosis. Bilateral neural foraminal narrowing (mild-to-moderate right, moderate-to-severe left). C7-T1: Grade 1 anterolisthesis. Shallow disc bulge. Moderate facet arthropathy on the right. Facet ankylosis on the left. Mild ligamentum flavum thickening. Mild spinal canal stenosis. The disc bulge slightly flattens the  ventral aspect of the spinal cord. Severe bilateral neural foraminal narrowing. IMPRESSION: 1. Cervical spondylosis as outlined within the body of the report. 2. At C4-C5, there is multifactorial severe spinal canal stenosis (with spinal cord flattening). Severe bilateral neural foraminal narrowing. 3. No more than mild spinal canal stenosis at the remaining levels. 4. Additional sites of foraminal stenosis, greatest on the right at C3-C4 (severe), bilaterally at C5-C6 (moderate-to-severe), on the left at C6-C7 (moderate-to-severe) and bilaterally at C7-T1 (severe). 5. Disc degeneration is greatest at C4-C5, C5-C6 and C6-C7 (moderate-to-advanced in severity with mild degenerative endplate edema at these levels). 6. Grade 1 anterolisthesis at C3-C4, C4-C5 and C7-T1. 7. Levocurvature of the cervical spine. 8. Multifocal marrow edema and enhancement within the posterior elements, likely degenerative and related to facet arthropathy. 9. Facet ankylosis on the left at C2-C3 and on the left at C7-T1. 10. Nonspecific edema and enhancement within the right paraspinal soft tissues at the C4-C7 levels. Electronically Signed   By: Rockey Childs D.O.   On: 07/25/2024 19:32   MR THORACIC SPINE W WO CONTRAST Result Date: 07/25/2024 EXAM: MRI THORACIC SPINE WITH AND WITHOUT INTRAVENOUS CONTRAST 07/25/2024 05:42:39 PM TECHNIQUE: Multiplanar multisequence MRI of the thoracic spine was performed with and without the administration of intravenous contrast. COMPARISON: None available. CLINICAL HISTORY: Bone lesion, thoracic spine, incidental. Patient reports increasing weakness in his legs, multiple falls, and stiffness in his legs. History of polio with prior multiple surgeries. Prescribed gabapentin  with no significant help. FINDINGS: BONES AND ALIGNMENT: Mild thoracic levoscoliosis. Normal vertebral body heights. Bone marrow signal is unremarkable. No abnormal enhancement. SPINAL CORD: Normal spinal cord volume. Normal spinal cord  signal. SOFT TISSUES: Unremarkable. DEGENERATIVE CHANGES: Minimal degenerative disc disease. No spinal canal stenosis or neural foraminal narrowing. IMPRESSION: 1. No spinal canal stenosis or neural foraminal narrowing in the thoracic spine. 2. Mild thoracic levoscoliosis.  Electronically signed by: Franky Stanford MD 07/25/2024 07:13 PM EDT RP Workstation: [id number]   MR Brain W and Wo Contrast Result Date: 07/25/2024 EXAM: MRI BRAIN WITH AND WITHOUT CONTRAST 07/25/2024 05:42:39 PM TECHNIQUE: Multiplanar multisequence MRI of the head/brain was performed with and without the administration of intravenous contrast. COMPARISON: CT head 06/06/2024 CLINICAL HISTORY: Headache, increasing frequency or severity. Patient reports increasing weakness in his legs and multiple falls. History of polio with prior multiple surgeries. FINDINGS: BRAIN AND VENTRICLES: There is a peripheral enhancing mass centered in the right frontoparietal white matter involving the centrum semiovale and extending into the periventricular white matter and corona radiata. The mass measures 4.5 x 4.5 x 2.9 cm. The mass abuts the ependymal surface of the right lateral ventricle with associated mass effect on the ventricle. Mild diffusion restriction. There is associated susceptibility along the periphery of the mass. Surrounding T2/FLAIR hyperintensity suggestive of peritumoral edema which involves the posterior aspect of the right centrum semiovale and the right periventricular white matter. Additional T2/FLAIR hyperintensity in the periventricular and subcortical white matter suggestive of chronic microvascular ischemic changes. There is mild cerebral volume loss. No significant midline shift. No evidence of acute infarct. ORBITS: No acute abnormality. SINUSES: Mild mucosal thickening in the left maxillary sinus. Chronic deformity of the right lamina papyracea. BONES AND SOFT TISSUES: Normal bone marrow signal and enhancement. No acute soft tissue  abnormality. IMPRESSION: 1. Peripheral enhancing mass in the right frontoparietal lobes measuring 4.5 x 4.5 x 2.9 cm, with mild associated peritumoral edema. The mass abuts the ependymal surface of the right lateral ventricle, causing mass effect without significant midline shift. 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 2. Additional T2/FLAIR hyperintensity in the periventricular and subcortical white matter, suggestive of chronic microvascular ischemic changes. 3. No acute infarct. Electronically signed by: Donnice Mania MD 07/25/2024 06:59 PM EDT RP Workstation: [id number]   CT HEAD WO CONTRAST ( ) Result Date: 07/25/2024 CLINICAL DATA:  Provided history: Headache, new onset. EXAM: CT HEAD WITHOUT CONTRAST TECHNIQUE: Contiguous axial images were obtained from the base of the skull through the vertex without intravenous contrast. RADIATION DOSE REDUCTION: This exam was performed according to the departmental dose-optimization program which includes automated exposure control, adjustment of the mA and/or kV according to patient size and/or use of iterative reconstruction technique. COMPARISON:  Head CT 05/07/2024. FINDINGS: Brain: Since the head CT of 05/07/2024, interval increase in size of a lesion centered within the right frontoparietal white matter, now measuring 4.9 x 3.7 cm (for instance as seen on series 5, image 23) (series 2, image 20). The lesion also appears to extend to involve the callosal body (for instance as seen on series 4, image 36). This is most suspicious for a mass. Centrally, the lesion is hypodense. There is ill-defined hyperdensity along the which could reflect hypercellularity, mineralization and/or associated hemorrhage. Progressive mass effect with partial effacement of the right lateral ventricle. No midline shift. No demarcated cortical infarct. No extra-axial fluid collection. Vascular: No hyperdense vessel.  Atherosclerotic calcifications. Skull:  No calvarial fracture or aggressive osseous lesion. Visible sinuses/orbits: No mass or acute finding within the imaged orbits. Chronic medially displaced fracture deformity of the right lamina papyracea. Mild mucosal thickening within the right frontal sinus inferiorly. IMPRESSION: 4.9 x 3.7 cm lesion centered within the right frontoparietal white matter, as described and increased in size since the head CT of 05/07/2024. This is most suspicious for a mass (such as a high-grade primary CNS neoplasm, metastatic lesion or  lymphoma). However, a brain MRI (with and without contrast) is recommended for further characterization. Progressive mass effect with partial effacement of the right lateral ventricle. No midline shift. Ill-defined hyperdensity at the periphery of the lesion which could reflect hypercellularity, mineralization and/or hemorrhage. Electronically Signed   By: Rockey Childs D.O.   On: 07/25/2024 16:20      IMPRESSION/PLAN:***    On date of service, in total, I spent *** minutes on this encounter. Patient was seen in person.   __________________________________________   Lauraine Golden, MD  This document serves as a record of services personally performed by Lauraine Golden, MD. It was created on her behalf by Dorthy Fuse, a trained medical scribe. The creation of this record is based on the scribe's personal observations and the provider's statements to them. This document has been checked and approved by the attending provider.

## 2024-08-24 ENCOUNTER — Other Ambulatory Visit: Payer: Self-pay

## 2024-08-24 ENCOUNTER — Encounter: Payer: Self-pay | Admitting: Radiation Oncology

## 2024-08-24 ENCOUNTER — Telehealth: Payer: Self-pay | Admitting: Radiation Therapy

## 2024-08-24 ENCOUNTER — Telehealth: Payer: Self-pay | Admitting: *Deleted

## 2024-08-24 ENCOUNTER — Inpatient Hospital Stay: Admitting: Licensed Clinical Social Worker

## 2024-08-24 ENCOUNTER — Ambulatory Visit
Admission: RE | Admit: 2024-08-24 | Discharge: 2024-08-24 | Disposition: A | Discharge status: Home / Self Care | Source: Ambulatory Visit | Attending: Radiation Oncology | Admitting: Radiation Oncology

## 2024-08-24 ENCOUNTER — Other Ambulatory Visit (HOSPITAL_COMMUNITY): Payer: Self-pay

## 2024-08-24 ENCOUNTER — Telehealth: Payer: Self-pay

## 2024-08-24 DIAGNOSIS — C711 Malignant neoplasm of frontal lobe: Secondary | ICD-10-CM | POA: Insufficient documentation

## 2024-08-24 DIAGNOSIS — C719 Malignant neoplasm of brain, unspecified: Secondary | ICD-10-CM

## 2024-08-24 NOTE — Telephone Encounter (Signed)
 Oral Oncology Patient Advocate Encounter   Received notification that prior authorization for temozolomide (TEMODAR) 140 MG capsule  is required.   PA submitted on 08/24/24 Key [id number] Status is pending     Lucie Lamer, CPhT [place]  [facility] Specialty Pharmacy Services Pharmacy Technician Patient Advocate Specialist II [facility] [place] Phone: [phone]  Fax: [phone] [email or web address]

## 2024-08-24 NOTE — Telephone Encounter (Signed)
 Received vm message from pt's significant other., [name] [name]. Message was very broken up and difficult to understand her request.  TCT her. No answer but was able to leave vm message for her to call back to [phone]

## 2024-08-24 NOTE — Progress Notes (Signed)
 [facility] CSW Progress Note  Clinical Child psychotherapist contacted caregiver by phone to follow-up on financial concerns.    Interventions: Per pt's significant other, their rent was due on October 1st.  Pt has not worked in several weeks and they do not have the funds to pay rent.  CSW submitted an application for the [facility] grant on behalf of pt for the amount of $1,100.  CSW informed pt's significant other that a copy of the lease would needed to be submitted in addition to the application.  Pt's significant other verbalized understanding, stating she will email a copy of the lease.  CSW to forward lease once it is received.        Follow Up Plan:  Patient will contact CSW with any support or resource needs    Joshua JONELLE Manna, LCSW Clinical Social Worker [facility]

## 2024-08-24 NOTE — Telephone Encounter (Signed)
 I was asked by Dr. Izell to check on the outpatient PT referral for this patient. He has not yet heard from them to set up treatment and is anxious to get started.  I called [facility], where the referral was sent at discharge. They have checked the system and reached out to [facility], a liaison with [facility]. He has now been accepted as a patient and their team will reach out to him this afternoon for scheduling.    [facility] Address: [address] [place], [place], [place] [address] Phone: [phone]    Devere Perch R.T(R)(T) Radiation Special Procedures Lead

## 2024-08-27 ENCOUNTER — Inpatient Hospital Stay: Admitting: Licensed Clinical Social Worker

## 2024-08-27 ENCOUNTER — Ambulatory Visit

## 2024-08-27 DIAGNOSIS — Z8612 Personal history of poliomyelitis: Secondary | ICD-10-CM | POA: Insufficient documentation

## 2024-08-27 DIAGNOSIS — C719 Malignant neoplasm of brain, unspecified: Secondary | ICD-10-CM

## 2024-08-27 NOTE — Progress Notes (Signed)
 [facility] CSW Progress Note   Interventions: CSW received confirmation from [facility] that they have approved 1 month's payment of rent for pt.  [facility] requesting a copy of pt's lease in order to send payment.  CSW reached pt's significant other by phone, informing of the above.  Once received, pt's lease will be submitted to [facility].        Follow Up Plan:  Patient will contact CSW with any support or resource needs    Devere JONELLE Manna, LCSW Clinical Social Worker [facility]

## 2024-08-27 NOTE — Telephone Encounter (Signed)
 Oral Oncology Patient Advocate Encounter  Prior Authorization for  temozolomide (TEMODAR) 140 MG capsule  has been approved.    PA# [id number] Effective dates: 08/24/24 through 08/24/25  Patient must fill at [facility].   Lucie Lamer, CPhT [place]  [facility] Specialty Pharmacy Services Pharmacy Technician Patient Advocate Specialist II [place] Phone: [phone]  Fax: [phone] [email or web address]

## 2024-08-27 NOTE — Progress Notes (Unsigned)
 New Patient Visit   Physician: Tiea Manninen A Frisco Cordts, MD  Patient: Joshua Harmon   DOB: 02/09/1961   63 y.o. Male  MRN: [id number] Visit Date: 08/27/2024   No chief complaint on file.  Subjective  Joshua Harmon is a 63 y.o. male who presents today as a new patient to establish care.   HPI  Discussed the use of AI scribe software for clinical note transcription with the patient, who gave verbal consent to proceed.  History of Present Illness             ASSESSMENT & PLAN  Encounter Diagnoses  Name Primary?  . Glioblastoma (HCC) Yes    No orders of the defined types were placed in this encounter.   Assessment and Plan                  Objective  There were no vitals taken for this visit. {Insert last BP/Wt (optional):23777}{See vitals history (optional):1}    Review of Systems  Constitutional:  Negative for chills, fever and weight loss.  Eyes:  Negative for blurred vision. h Respiratory:  Negative for cough and shortness of breath.   Cardiovascular:  Negative for chest pain and palpitations.  Skin:  Negative for rash.  Psychiatric/Behavioral:  Negative for depression. The patient is not nervous/anxious.      Physical Exam Physical Exam Vitals reviewed.  Constitutional:      Appearance: Normal appearance. Well-developed with normal weight.  HENT:     Head: Normocephalic and atraumatic.  Normal mucous membranes, no oral lesions Eyes:     Pupils: Pupils are equal, round, and reactive to light.  Neck:     Thyroid: No thyroid mass or thyromegaly.  Cardiovascular:     Rate and Rhythm: Normal rate and regular rhythm. Normal heart sounds. Normal peripheral pulses Pulmonary:     Normal breath sounds with normal effort Abdominal:   Abdomen is soft, without tenderness or noted hepatosplenomegaly Musculoskeletal:        General: No swelling or edema  Lymphadenopathy:     Cervical: No cervical adenopathy.  Skin:    General: Skin is warm and dry  without noticeable rash. Neurological:     General: No focal deficit present.  Psychiatric:        Mood and Affect: Mood, behavior and cognition normal   Past Medical History:  Diagnosis Date  . GERD (gastroesophageal reflux disease)    Past Surgical History:  Procedure Laterality Date  . APPLICATION OF CRANIAL NAVIGATION Right 07/30/2024   Procedure: COMPUTER-ASSISTED NAVIGATION, FOR CRANIAL PROCEDURE;  Surgeon: Rosslyn Dino HERO, MD;  Location: [facility] OR;  Service: Neurosurgery;  Laterality: Right;  CRANIAL NAVIGATION  . LEG SURGERY Bilateral as a child  . STERIOTACTIC STIMULATOR INSERTION Right 07/30/2024   Procedure: RIGHT STEREOTACTIC BIOPSY;  Surgeon: Rosslyn Dino HERO, MD;  Location: [facility] OR;  Service: Neurosurgery;  Laterality: Right;  RIGHT STERIOTACTIC BRAIN BIOPSY  . XI ROBOTIC ASSISTED INGUINAL HERNIA REPAIR WITH MESH Right 02/19/2020   Procedure: XI ROBOTIC ASSISTED Right INGUINAL HERNIA REPAIR WITH MESH;  Surgeon: Jordis Laneta FALCON, MD;  Location: [facility] ORS;  Service: General;  Laterality: Right;   Family Status  Relation Name Status  . Mother  (Not Specified)  . Father  (Not Specified)  . Mat Aunt  (Not Specified)  No partnership data on file   Family History  Problem Relation Age of Onset  . Dementia Mother   . Dementia Father   . Dementia  Maternal Aunt    Social History   Socioeconomic History  . Marital status: Married    Spouse name: Not on file  . Number of children: Not on file  . Years of education: Not on file  . Highest education level: Not on file  Occupational History  . Not on file  Tobacco Use  . Smoking status: Every Day    Current packs/day: 0.25    Types: Cigarettes  . Smokeless tobacco: Never  Vaping Use  . Vaping status: Never Used  Substance and Sexual Activity  . Alcohol use: Not Currently    Alcohol/week: 10.0 standard drinks of alcohol    Types: 10 Cans of beer per week  . Drug use: Not Currently  . Sexual activity: Yes    Partners: Female   Other Topics Concern  . Not on file  Social History Narrative  . Not on file   Social Drivers of Health   Financial Resource Strain: Medium Risk (05/07/2024)   Received from [facility]   Overall Financial Resource Strain (CARDIA)   . Difficulty of Paying Living Expenses: Somewhat hard  Food Insecurity: Food Insecurity Present (08/24/2024)   Hunger Vital Sign   . Worried About Programme researcher, broadcasting/film/video in the Last Year: Never true   . Ran Out of Food in the Last Year: Sometimes true  Transportation Needs: No Transportation Needs (08/24/2024)   PRAPARE - Transportation   . Lack of Transportation (Medical): No   . Lack of Transportation (Non-Medical): No  Physical Activity: Not on file  Stress: Not on file  Social Connections: Unknown (07/25/2024)   Social Connection and Isolation Panel   . Frequency of Communication with Friends and Family: Patient declined   . Frequency of Social Gatherings with Friends and Family: Patient declined   . Attends Religious Services: Patient declined   . Active Member of Clubs or Organizations: Patient declined   . Attends Banker Meetings: Patient declined   . Marital Status: Living with partner   Outpatient Medications Prior to Visit  Medication Sig  . acetaminophen  (TYLENOL ) 325 MG tablet Take 2 tablets (650 mg total) by mouth every 6 (six) hours as needed for mild pain (pain score 1-3) or fever (or Fever >/= 101).  . Baclofen  5 MG TABS Take 1 tablet (5 mg total) by mouth 2 (two) times daily.  . butalbital -acetaminophen -caffeine  (FIORICET) 50-325-40 MG tablet Take 2 tablets by mouth every 4 (four) hours as needed for headache (moderate pain).  . dexamethasone  (DECADRON ) 2 MG tablet Take 1 tablet (2 mg total) by mouth 2 (two) times daily.  . magnesium gluconate (MAGONATE) 500 (27 Mg) MG TABS tablet Take 0.5 tablets (250 mg total) by mouth daily.  . methylphenidate  (RITALIN ) 5 MG tablet Take 1 tablet (5 mg total) by mouth 2  (two) times daily with breakfast and lunch.  . ondansetron  (ZOFRAN ) 8 MG tablet Take 1 tablet (8 mg total) by mouth every 8 (eight) hours as needed for nausea or vomiting. May take 30-60 minutes prior to Temodar administration if nausea/vomiting occurs as needed.  . pantoprazole  (PROTONIX ) 40 MG tablet Take 1 tablet (40 mg total) by mouth daily.  . polyethylene glycol powder (GLYCOLAX /MIRALAX ) 17 GM/SCOOP powder Dissolve 1 capful (17g) in 4-8 ounces of liquid and take by mouth daily as needed for moderate constipation.  . senna-docusate (SENOKOT-S) 8.6-50 MG tablet Take 1 tablet by mouth at bedtime as needed for mild constipation.  . temozolomide (TEMODAR) 140 MG  capsule Take 1 capsule (140 mg total) by mouth daily. May take on an empty stomach to decrease nausea & vomiting.  . traZODone  (DESYREL ) 50 MG tablet Take 1 tablet (50 mg total) by mouth at bedtime as needed for sleep.   No facility-administered medications prior to visit.   No Known Allergies  There is no immunization history for the selected administration types on file for this patient.  Health Maintenance  Topic Date Due  . COVID-19 Vaccine (1) Never done  . Hepatitis C Screening  Never done  . DTaP/Tdap/Td (1 - Tdap) Never done  . Pneumococcal Vaccine: 50+ Years (1 of 2 - PCV) Never done  . Zoster Vaccines- Shingrix (1 of 2) Never done  . Mammogram  Never done  . Colonoscopy  Never done  . Influenza Vaccine  Never done  . HIV Screening  Completed  . Hepatitis B Vaccines 19-59 Average Risk  Aged Out  . HPV VACCINES  Aged Out  . Meningococcal B Vaccine  Aged Out    Patient Care Team: Everlene Parris LABOR, MD as PCP - General (Family Medicine)  Depression Screen    08/24/2024    9:49 AM  PHQ 2/9 Scores  PHQ - 2 Score 1     Parris LABOR Everlene, MD  [facility] [phone] (phone) [phone] (fax)  [facility]

## 2024-08-28 ENCOUNTER — Other Ambulatory Visit: Payer: Self-pay | Admitting: *Deleted

## 2024-08-28 ENCOUNTER — Other Ambulatory Visit: Payer: Self-pay

## 2024-08-28 MED ORDER — DEXAMETHASONE 2 MG PO TABS: 2.0000 mg | ORAL_TABLET | Freq: Two times a day (BID) | ORAL | Status: DC

## 2024-08-28 MED ORDER — DEXAMETHASONE 2 MG PO TABS: 2.0000 mg | ORAL_TABLET | Freq: Two times a day (BID) | ORAL | 2 refills | Status: DC

## 2024-08-28 MED ORDER — ONDANSETRON HCL 8 MG PO TABS: 8.0000 mg | ORAL_TABLET | Freq: Three times a day (TID) | ORAL | 1 refills | Status: DC | PRN

## 2024-08-28 NOTE — Telephone Encounter (Signed)
 Oral Oncology Pharmacist Encounter  Spoke with patient's significant other, [name] [name], regarding temozolomide. Informed Ms. [name] that medication is required to fill through [facility] and will ship to their home. She stated that she had their number and would call them to set up shipment.  Ms. Jeanine listed out all medications patient currently has at home and stated he does not have the dexamethasone  2 mg tablets. After reviewing medication list, it appears medication was no printed, thus did not get e-scribed to their pharmacy. Secure chat sent to Dr. Eward LPN, Rudean Franks, who will send to patient's local [facility] in [place]. Ondansetron  Rx has also been redirected to [facility] in [place] for patient to pick up.   Call cut off at the end, but will follow up with patient and patient's significant other on 08/29/24 to see if [facility] has been set up for shipment.    Asberry Macintosh, PharmD, BCPS, BCOP Hematology/Oncology Clinical Pharmacist [phone] 08/28/2024 10:08 AM

## 2024-08-29 ENCOUNTER — Other Ambulatory Visit (HOSPITAL_COMMUNITY): Payer: Self-pay

## 2024-08-30 ENCOUNTER — Inpatient Hospital Stay: Admitting: Licensed Clinical Social Worker

## 2024-08-30 DIAGNOSIS — C719 Malignant neoplasm of brain, unspecified: Secondary | ICD-10-CM

## 2024-08-31 ENCOUNTER — Ambulatory Visit
Admission: RE | Admit: 2024-08-31 | Discharge: 2024-08-31 | Disposition: A | Discharge status: Home / Self Care | Source: Ambulatory Visit | Attending: Radiation Oncology | Admitting: Radiation Oncology

## 2024-08-31 ENCOUNTER — Ambulatory Visit (INDEPENDENT_AMBULATORY_CARE_PROVIDER_SITE_OTHER): Admitting: Neurosurgery

## 2024-08-31 ENCOUNTER — Encounter: Payer: Self-pay | Admitting: Internal Medicine

## 2024-08-31 ENCOUNTER — Encounter: Payer: Self-pay | Admitting: Neurosurgery

## 2024-08-31 VITALS — BP 121/86 | HR 78 | Temp 98.2°F | Ht 73.0 in | Wt 143.0 lb

## 2024-08-31 DIAGNOSIS — D496 Neoplasm of unspecified behavior of brain: Secondary | ICD-10-CM

## 2024-08-31 DIAGNOSIS — Z510 Encounter for antineoplastic radiation therapy: Secondary | ICD-10-CM | POA: Insufficient documentation

## 2024-08-31 DIAGNOSIS — G9389 Other specified disorders of brain: Secondary | ICD-10-CM

## 2024-08-31 DIAGNOSIS — C711 Malignant neoplasm of frontal lobe: Secondary | ICD-10-CM | POA: Insufficient documentation

## 2024-08-31 NOTE — Progress Notes (Signed)
 63 year old gentleman with a history of a brain tumor for which he underwent a stereotactic brain biopsy.  Postoperatively he was transferred to rehab.  He returns with his daughter today.  He has already been fitted with a mask and is going to be undergoing radiation and oral chemo.  I explained to him and his daughter that from a neurosurgical standpoint, he does not need further treatment as the tumor is not a resectable 1.  I am hopeful that with radiation chemo the best results can be obtained.  His incision has healed well.  We decided to follow-up on an as-needed basis.

## 2024-08-31 NOTE — Progress Notes (Signed)
 [facility] CSW Progress Note  Clinical Child psychotherapist contacted pt's significant other by phone to follow-up on financial concerns.    Interventions: CSW received pt's lease via email.  Pt's lease was forwarded to [facility] who committed to take care of 1 month of rent for pt.  Pt's significant other informed of the above.  Pt's significant other inquired about stretcher transport options from [place] to [place].  Message sent to the transportation co-ordinator to inquire about options.        Follow Up Plan:  CSW will follow-up with patient by phone     Devere JONELLE Manna, LCSW Clinical Social Worker [facility]

## 2024-08-31 NOTE — Telephone Encounter (Signed)
 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   Treatment goal: Palliative  Counseled patient on administration, dosing, side effects, monitoring, drug-food interactions, safe handling, storage, and disposal.  Patient will take Temodar 140mg  capsules, 1 capsule, 140 mg total daily dose, by mouth once daily, may take at bedtime and on an empty stomach to decrease nausea and vomiting.  Patient will take Temodar concurrent with radiation for 42 days straight.  Temodar start date: 09/05/24 PM Radiation start date: 09/06/24  Adverse effects include but are not limited to: nausea, vomiting, GI upset, rash, and fatigue.  Nausea/Vomiting PPX: Prophylactic Zofran  will not be used at initiation of concurrent phase, but will be initiated if nausea develops despite Temodar administration on an empty stomach and at bedtime. If this occurs, patient will take Zofran  8 mg tablet, 1 tablet by mouth 30-60 min prior to Temodar dose to help decrease N/V   Reviewed with patient importance of keeping a medication schedule and plan for any missed doses. No barriers to medication adherence identified.  Medication reconciliation performed and medication/allergy list updated.  Distress thermometer flowsheet: Distress thermometer completed during telephone call and reviewed with patient. Due to score, social work referral has not been sent. Patient is already connected with social work.  Communication and Learning Assessment Primary learner: Patient and patient's significant other Barriers to learning: No barriers Preferred language: English Learning preferences: Listening Reading  All questions answered.  Patient and patient's significant other voiced understanding and appreciation.    Medication education handout and medication calendar placed in mail for patient. Patient knows to call the office with questions or concerns. Oral Chemotherapy Clinic phone number provided to patient.   Asberry Macintosh, PharmD, BCPS, BCOP Hematology/Oncology Clinical Pharmacist [phone] 08/31/2024 3:07 PM

## 2024-09-01 ENCOUNTER — Other Ambulatory Visit (HOSPITAL_COMMUNITY): Payer: Self-pay

## 2024-09-02 ENCOUNTER — Other Ambulatory Visit: Payer: Self-pay

## 2024-09-03 ENCOUNTER — Inpatient Hospital Stay: Admitting: Licensed Clinical Social Worker

## 2024-09-03 ENCOUNTER — Telehealth: Payer: Self-pay | Admitting: Internal Medicine

## 2024-09-03 DIAGNOSIS — C719 Malignant neoplasm of brain, unspecified: Secondary | ICD-10-CM

## 2024-09-03 NOTE — Telephone Encounter (Unsigned)
 Copied from CRM [id number]. Topic: Clinical - Home Health Verbal Orders >> Sep 03, 2024  4:38 PM Nathanel BROCKS wrote: Caller/Agency: [facility], Inocente Rushing Number: [phone] Service Requested: Occupational Therapy Frequency: 1 mth 2  Any new concerns about the patient? No

## 2024-09-03 NOTE — Telephone Encounter (Signed)
 Called and left the patient a voicemail with his next upcoming appointments.

## 2024-09-03 NOTE — Progress Notes (Signed)
 [facility] CSW Progress Note   Interventions: CSW received confirmation from [facility] that a check for pt's rent was put in the mail today.  CSW sent a message to pt's landlord informing of the above.        Follow Up Plan:  Patient will contact CSW with any support or resource needs    Joshua JONELLE Manna, LCSW Clinical Social Worker [facility]

## 2024-09-04 ENCOUNTER — Telehealth: Payer: Self-pay

## 2024-09-04 NOTE — Telephone Encounter (Signed)
 Unable to LM, voicemail was full. Pt copy was mailed to his home address as requested.

## 2024-09-05 DIAGNOSIS — Z510 Encounter for antineoplastic radiation therapy: Secondary | ICD-10-CM | POA: Diagnosis not present

## 2024-09-05 NOTE — Telephone Encounter (Signed)
 Joshua Harmon calling back to follow up. Advised of Dr. Zafirov's message. Joshua Harmon verbalized understanding.

## 2024-09-06 ENCOUNTER — Ambulatory Visit

## 2024-09-06 ENCOUNTER — Ambulatory Visit
Admission: RE | Admit: 2024-09-06 | Discharge: 2024-09-06 | Disposition: A | Discharge status: Home / Self Care | Source: Ambulatory Visit | Attending: Radiation Oncology | Admitting: Radiation Oncology

## 2024-09-06 ENCOUNTER — Inpatient Hospital Stay: Admitting: Licensed Clinical Social Worker

## 2024-09-06 ENCOUNTER — Encounter: Payer: Self-pay | Admitting: Licensed Clinical Social Worker

## 2024-09-06 ENCOUNTER — Other Ambulatory Visit: Payer: Self-pay

## 2024-09-06 DIAGNOSIS — C719 Malignant neoplasm of brain, unspecified: Secondary | ICD-10-CM

## 2024-09-06 DIAGNOSIS — Z510 Encounter for antineoplastic radiation therapy: Secondary | ICD-10-CM | POA: Diagnosis not present

## 2024-09-06 LAB — RAD ONC ARIA SESSION SUMMARY
Course Elapsed Days: 0
Plan Fractions Treated to Date: 1
Plan Prescribed Dose Per Fraction: 2 Gy
Plan Total Fractions Prescribed: 23
Plan Total Prescribed Dose: 46 Gy
Reference Point Dosage Given to Date: 2 Gy
Reference Point Session Dosage Given: 2 Gy
Session Number: 1

## 2024-09-06 NOTE — Progress Notes (Signed)
 [facility] CSW Progress Note  Clinical Child psychotherapist contacted caregiver by phone to follow-up on transportation concerns.    Interventions: RN provided CSW w/ contact information for [facility] who will be able to facilitate stretcher transport for pt.  CSW spoke w/ pt's significant other who was on the phone w/ [employer] at the time of the call.  Pt is all set for stretcher transport on 10/17.  Pt's significant other will see if pt's daughter can try to get pt to today's appointment.  If not she will call to inform the team and cancel.  Radiation team informed of the above.        Follow Up Plan:  Patient will contact CSW with any support or resource needs    Devere JONELLE Manna, LCSW Clinical Social Worker [facility]

## 2024-09-07 ENCOUNTER — Ambulatory Visit
Admission: RE | Admit: 2024-09-07 | Discharge: 2024-09-07 | Disposition: A | Discharge status: Home / Self Care | Source: Ambulatory Visit | Attending: Radiation Oncology | Admitting: Radiation Oncology

## 2024-09-07 ENCOUNTER — Other Ambulatory Visit: Payer: Self-pay

## 2024-09-07 DIAGNOSIS — Z510 Encounter for antineoplastic radiation therapy: Secondary | ICD-10-CM | POA: Diagnosis not present

## 2024-09-07 LAB — RAD ONC ARIA SESSION SUMMARY
Course Elapsed Days: 1
Plan Fractions Treated to Date: 2
Plan Prescribed Dose Per Fraction: 2 Gy
Plan Total Fractions Prescribed: 23
Plan Total Prescribed Dose: 46 Gy
Reference Point Dosage Given to Date: 4 Gy
Reference Point Session Dosage Given: 2 Gy
Session Number: 2

## 2024-09-10 ENCOUNTER — Telehealth: Payer: Self-pay | Admitting: *Deleted

## 2024-09-10 ENCOUNTER — Other Ambulatory Visit: Payer: Self-pay

## 2024-09-10 ENCOUNTER — Ambulatory Visit
Admission: RE | Admit: 2024-09-10 | Discharge: 2024-09-10 | Disposition: A | Source: Ambulatory Visit | Attending: Radiation Oncology

## 2024-09-10 ENCOUNTER — Ambulatory Visit
Admission: RE | Admit: 2024-09-10 | Discharge: 2024-09-10 | Disposition: A | Discharge status: Home / Self Care | Source: Ambulatory Visit | Attending: Radiation Oncology | Admitting: Radiation Oncology

## 2024-09-10 DIAGNOSIS — Z510 Encounter for antineoplastic radiation therapy: Secondary | ICD-10-CM | POA: Diagnosis not present

## 2024-09-10 LAB — RAD ONC ARIA SESSION SUMMARY
Course Elapsed Days: 4
Plan Fractions Treated to Date: 3
Plan Prescribed Dose Per Fraction: 2 Gy
Plan Total Fractions Prescribed: 23
Plan Total Prescribed Dose: 46 Gy
Reference Point Dosage Given to Date: 6 Gy
Reference Point Session Dosage Given: 2 Gy
Session Number: 3

## 2024-09-10 NOTE — Telephone Encounter (Signed)
 Physician order received from [facility], signed by Dr Buckley & returned by fax to [phone].  Fax confirmation received.

## 2024-09-11 ENCOUNTER — Other Ambulatory Visit: Payer: Self-pay

## 2024-09-11 ENCOUNTER — Ambulatory Visit
Admission: RE | Admit: 2024-09-11 | Discharge: 2024-09-11 | Disposition: A | Discharge status: Home / Self Care | Source: Ambulatory Visit | Attending: Radiation Oncology | Admitting: Radiation Oncology

## 2024-09-11 DIAGNOSIS — Z510 Encounter for antineoplastic radiation therapy: Secondary | ICD-10-CM | POA: Diagnosis not present

## 2024-09-11 LAB — RAD ONC ARIA SESSION SUMMARY
Course Elapsed Days: 5
Plan Fractions Treated to Date: 4
Plan Prescribed Dose Per Fraction: 2 Gy
Plan Total Fractions Prescribed: 23
Plan Total Prescribed Dose: 46 Gy
Reference Point Dosage Given to Date: 8 Gy
Reference Point Session Dosage Given: 2 Gy
Session Number: 4

## 2024-09-12 ENCOUNTER — Other Ambulatory Visit: Payer: Self-pay

## 2024-09-12 ENCOUNTER — Ambulatory Visit
Admission: RE | Admit: 2024-09-12 | Discharge: 2024-09-12 | Disposition: A | Discharge status: Home / Self Care | Source: Ambulatory Visit | Attending: Radiation Oncology | Admitting: Radiation Oncology

## 2024-09-12 DIAGNOSIS — Z510 Encounter for antineoplastic radiation therapy: Secondary | ICD-10-CM | POA: Diagnosis not present

## 2024-09-12 LAB — RAD ONC ARIA SESSION SUMMARY
Course Elapsed Days: 6
Plan Fractions Treated to Date: 5
Plan Prescribed Dose Per Fraction: 2 Gy
Plan Total Fractions Prescribed: 23
Plan Total Prescribed Dose: 46 Gy
Reference Point Dosage Given to Date: 10 Gy
Reference Point Session Dosage Given: 2 Gy
Session Number: 5

## 2024-09-13 ENCOUNTER — Ambulatory Visit
Admission: RE | Admit: 2024-09-13 | Discharge: 2024-09-13 | Disposition: A | Discharge status: Home / Self Care | Source: Ambulatory Visit | Attending: Radiation Oncology | Admitting: Radiation Oncology

## 2024-09-13 ENCOUNTER — Inpatient Hospital Stay: Admitting: Licensed Clinical Social Worker

## 2024-09-13 ENCOUNTER — Encounter (HOSPITAL_COMMUNITY): Payer: Self-pay

## 2024-09-13 ENCOUNTER — Other Ambulatory Visit: Payer: Self-pay

## 2024-09-13 DIAGNOSIS — Z510 Encounter for antineoplastic radiation therapy: Secondary | ICD-10-CM | POA: Diagnosis not present

## 2024-09-13 DIAGNOSIS — C719 Malignant neoplasm of brain, unspecified: Secondary | ICD-10-CM

## 2024-09-13 LAB — RAD ONC ARIA SESSION SUMMARY
Course Elapsed Days: 7
Plan Fractions Treated to Date: 6
Plan Prescribed Dose Per Fraction: 2 Gy
Plan Total Fractions Prescribed: 23
Plan Total Prescribed Dose: 46 Gy
Reference Point Dosage Given to Date: 12 Gy
Reference Point Session Dosage Given: 2 Gy
Session Number: 6

## 2024-09-13 NOTE — Progress Notes (Signed)
 [facility] CSW Progress Note    Interventions: CSW received a call from pt's significant other requesting assistance w/ utilities.  Pt reportedly applied for SNAP, but was denied.  CSW encouraged pt's significant other to re-apply as pt has no income at this time and should be approved which would make pt eligible for the [employer].  CSW also sent a referral to [facility] to see if they have any funds available to assist.        Follow Up Plan:  Patient will contact CSW with any support or resource needs    Devere JONELLE Manna, LCSW Clinical Social Worker [facility]

## 2024-09-14 ENCOUNTER — Ambulatory Visit
Admission: RE | Admit: 2024-09-14 | Discharge: 2024-09-14 | Disposition: A | Discharge status: Home / Self Care | Source: Ambulatory Visit | Attending: Radiation Oncology | Admitting: Radiation Oncology

## 2024-09-14 ENCOUNTER — Other Ambulatory Visit: Payer: Self-pay

## 2024-09-14 DIAGNOSIS — Z510 Encounter for antineoplastic radiation therapy: Secondary | ICD-10-CM | POA: Diagnosis not present

## 2024-09-14 LAB — RAD ONC ARIA SESSION SUMMARY
Course Elapsed Days: 8
Plan Fractions Treated to Date: 7
Plan Prescribed Dose Per Fraction: 2 Gy
Plan Total Fractions Prescribed: 23
Plan Total Prescribed Dose: 46 Gy
Reference Point Dosage Given to Date: 14 Gy
Reference Point Session Dosage Given: 2 Gy
Session Number: 7

## 2024-09-17 ENCOUNTER — Ambulatory Visit
Admission: RE | Admit: 2024-09-17 | Discharge: 2024-09-17 | Disposition: A | Discharge status: Home / Self Care | Source: Ambulatory Visit | Attending: Radiation Oncology | Admitting: Radiation Oncology

## 2024-09-17 ENCOUNTER — Other Ambulatory Visit: Payer: Self-pay

## 2024-09-17 ENCOUNTER — Encounter: Payer: Self-pay | Admitting: Licensed Clinical Social Worker

## 2024-09-17 ENCOUNTER — Telehealth: Payer: Self-pay | Admitting: *Deleted

## 2024-09-17 DIAGNOSIS — Z510 Encounter for antineoplastic radiation therapy: Secondary | ICD-10-CM | POA: Diagnosis not present

## 2024-09-17 LAB — RAD ONC ARIA SESSION SUMMARY
Course Elapsed Days: 11
Plan Fractions Treated to Date: 8
Plan Prescribed Dose Per Fraction: 2 Gy
Plan Total Fractions Prescribed: 23
Plan Total Prescribed Dose: 46 Gy
Reference Point Dosage Given to Date: 16 Gy
Reference Point Session Dosage Given: 2 Gy
Session Number: 8

## 2024-09-17 NOTE — Telephone Encounter (Signed)
 Home Health Certification & Plan of Care signed by Dr Buckley & returned to [facility], fax #[phone].  Fax confirmation received.

## 2024-09-18 ENCOUNTER — Ambulatory Visit
Admission: RE | Admit: 2024-09-18 | Discharge: 2024-09-18 | Disposition: A | Source: Ambulatory Visit | Attending: Radiation Oncology

## 2024-09-18 ENCOUNTER — Other Ambulatory Visit: Payer: Self-pay

## 2024-09-18 DIAGNOSIS — Z510 Encounter for antineoplastic radiation therapy: Secondary | ICD-10-CM | POA: Diagnosis not present

## 2024-09-18 LAB — RAD ONC ARIA SESSION SUMMARY
Course Elapsed Days: 12
Plan Fractions Treated to Date: 9
Plan Prescribed Dose Per Fraction: 2 Gy
Plan Total Fractions Prescribed: 23
Plan Total Prescribed Dose: 46 Gy
Reference Point Dosage Given to Date: 18 Gy
Reference Point Session Dosage Given: 1.5127 Gy
Session Number: 9

## 2024-09-19 ENCOUNTER — Other Ambulatory Visit: Payer: Self-pay

## 2024-09-19 ENCOUNTER — Ambulatory Visit
Admission: RE | Admit: 2024-09-19 | Discharge: 2024-09-19 | Disposition: A | Source: Ambulatory Visit | Attending: Radiation Oncology

## 2024-09-19 DIAGNOSIS — Z510 Encounter for antineoplastic radiation therapy: Secondary | ICD-10-CM | POA: Diagnosis not present

## 2024-09-19 LAB — RAD ONC ARIA SESSION SUMMARY
Course Elapsed Days: 13
Plan Fractions Treated to Date: 10
Plan Prescribed Dose Per Fraction: 2 Gy
Plan Total Fractions Prescribed: 23
Plan Total Prescribed Dose: 46 Gy
Reference Point Dosage Given to Date: 20 Gy
Reference Point Session Dosage Given: 2 Gy
Session Number: 10

## 2024-09-20 ENCOUNTER — Other Ambulatory Visit: Payer: Self-pay

## 2024-09-20 ENCOUNTER — Telehealth: Payer: Self-pay | Admitting: *Deleted

## 2024-09-20 ENCOUNTER — Ambulatory Visit
Admission: RE | Admit: 2024-09-20 | Discharge: 2024-09-20 | Disposition: A | Source: Ambulatory Visit | Attending: Radiation Oncology

## 2024-09-20 ENCOUNTER — Inpatient Hospital Stay

## 2024-09-20 ENCOUNTER — Inpatient Hospital Stay: Admitting: Internal Medicine

## 2024-09-20 DIAGNOSIS — Z510 Encounter for antineoplastic radiation therapy: Secondary | ICD-10-CM | POA: Diagnosis not present

## 2024-09-20 LAB — RAD ONC ARIA SESSION SUMMARY
Course Elapsed Days: 14
Plan Fractions Treated to Date: 11
Plan Prescribed Dose Per Fraction: 2 Gy
Plan Total Fractions Prescribed: 23
Plan Total Prescribed Dose: 46 Gy
Reference Point Dosage Given to Date: 22 Gy
Reference Point Session Dosage Given: 2 Gy
Session Number: 11

## 2024-09-20 NOTE — Telephone Encounter (Signed)
 PC to patient's S/O [name], informed her patient missed lab & MD appointment with Dr Buckley today.  She states they were unaware.  Appointments have been rescheduled, he has lab appointment on 09/24/24 at 3:45, Dr Buckley will see him in radiation at 4:00, and he will have radiation at 4:15.  Radiation team is aware, [name] verbalizes understanding.

## 2024-09-21 ENCOUNTER — Ambulatory Visit
Admission: RE | Admit: 2024-09-21 | Discharge: 2024-09-21 | Disposition: A | Discharge status: Home / Self Care | Source: Ambulatory Visit | Attending: Radiation Oncology | Admitting: Radiation Oncology

## 2024-09-21 ENCOUNTER — Other Ambulatory Visit: Payer: Self-pay

## 2024-09-21 DIAGNOSIS — Z510 Encounter for antineoplastic radiation therapy: Secondary | ICD-10-CM | POA: Diagnosis not present

## 2024-09-21 LAB — RAD ONC ARIA SESSION SUMMARY
Course Elapsed Days: 15
Plan Fractions Treated to Date: 12
Plan Prescribed Dose Per Fraction: 2 Gy
Plan Total Fractions Prescribed: 23
Plan Total Prescribed Dose: 46 Gy
Reference Point Dosage Given to Date: 24 Gy
Reference Point Session Dosage Given: 2 Gy
Session Number: 12

## 2024-09-24 ENCOUNTER — Ambulatory Visit
Admission: RE | Admit: 2024-09-24 | Discharge: 2024-09-24 | Disposition: A | Discharge status: Home / Self Care | Source: Ambulatory Visit | Attending: Radiation Oncology | Admitting: Radiation Oncology

## 2024-09-24 ENCOUNTER — Other Ambulatory Visit: Payer: Self-pay

## 2024-09-24 ENCOUNTER — Inpatient Hospital Stay: Attending: Internal Medicine

## 2024-09-24 ENCOUNTER — Ambulatory Visit

## 2024-09-24 ENCOUNTER — Inpatient Hospital Stay: Admitting: Internal Medicine

## 2024-09-24 DIAGNOSIS — C719 Malignant neoplasm of brain, unspecified: Secondary | ICD-10-CM

## 2024-09-24 DIAGNOSIS — C711 Malignant neoplasm of frontal lobe: Secondary | ICD-10-CM | POA: Insufficient documentation

## 2024-09-24 DIAGNOSIS — Z79630 Long term (current) use of alkylating agent: Secondary | ICD-10-CM | POA: Insufficient documentation

## 2024-09-24 DIAGNOSIS — Z7952 Long term (current) use of systemic steroids: Secondary | ICD-10-CM | POA: Insufficient documentation

## 2024-09-24 DIAGNOSIS — Z510 Encounter for antineoplastic radiation therapy: Secondary | ICD-10-CM | POA: Insufficient documentation

## 2024-09-24 DIAGNOSIS — D696 Thrombocytopenia, unspecified: Secondary | ICD-10-CM | POA: Insufficient documentation

## 2024-09-24 DIAGNOSIS — N390 Urinary tract infection, site not specified: Secondary | ICD-10-CM | POA: Insufficient documentation

## 2024-09-24 DIAGNOSIS — R531 Weakness: Secondary | ICD-10-CM | POA: Insufficient documentation

## 2024-09-24 DIAGNOSIS — Z79899 Other long term (current) drug therapy: Secondary | ICD-10-CM | POA: Insufficient documentation

## 2024-09-24 DIAGNOSIS — R319 Hematuria, unspecified: Secondary | ICD-10-CM | POA: Insufficient documentation

## 2024-09-24 DIAGNOSIS — Z7401 Bed confinement status: Secondary | ICD-10-CM | POA: Insufficient documentation

## 2024-09-24 DIAGNOSIS — Z993 Dependence on wheelchair: Secondary | ICD-10-CM | POA: Insufficient documentation

## 2024-09-24 LAB — CMP (CANCER CENTER ONLY)
ALT: 23 U/L (ref 0–44)
AST: 16 U/L (ref 15–41)
Albumin: 3.9 g/dL (ref 3.5–5.0)
Alkaline Phosphatase: 67 U/L (ref 38–126)
Anion gap: 5 (ref 5–15)
BUN: 16 mg/dL (ref 8–23)
CO2: 30 mmol/L (ref 22–32)
Calcium: 9.8 mg/dL (ref 8.9–10.3)
Chloride: 101 mmol/L (ref 98–111)
Creatinine: 0.92 mg/dL (ref 0.61–1.24)
GFR, Estimated: >60 mL/min (ref >60)
Glucose, Bld: 100 mg/dL — ABNORMAL HIGH (ref 70–99)
Potassium: 5 mmol/L (ref 3.5–5.1)
Sodium: 136 mmol/L (ref 135–145)
Total Bilirubin: 0.4 mg/dL (ref 0.0–1.2)
Total Protein: 7.4 g/dL (ref 6.5–8.1)

## 2024-09-24 LAB — RAD ONC ARIA SESSION SUMMARY
Course Elapsed Days: 18
Plan Fractions Treated to Date: 13
Plan Prescribed Dose Per Fraction: 2 Gy
Plan Total Fractions Prescribed: 23
Plan Total Prescribed Dose: 46 Gy
Reference Point Dosage Given to Date: 26 Gy
Reference Point Session Dosage Given: 2 Gy
Session Number: 13

## 2024-09-24 LAB — CBC WITH DIFFERENTIAL (CANCER CENTER ONLY)
Abs Immature Granulocytes: 0.01 K/uL (ref 0.00–0.07)
Basophils Absolute: 0 K/uL (ref 0.0–0.1)
Basophils Relative: 0 %
Eosinophils Absolute: 0.2 K/uL (ref 0.0–0.5)
Eosinophils Relative: 2 %
HCT: 47.7 % (ref 39.0–52.0)
Hemoglobin: 16.4 g/dL (ref 13.0–17.0)
Immature Granulocytes: 0 %
Lymphocytes Relative: 9 %
Lymphs Abs: 0.7 K/uL (ref 0.7–4.0)
MCH: 30.8 pg (ref 26.0–34.0)
MCHC: 34.4 g/dL (ref 30.0–36.0)
MCV: 89.7 fL (ref 80.0–100.0)
Monocytes Absolute: 0.4 K/uL (ref 0.1–1.0)
Monocytes Relative: 6 %
Neutro Abs: 5.7 K/uL (ref 1.7–7.7)
Neutrophils Relative %: 83 %
Platelet Count: 259 K/uL (ref 150–400)
RBC: 5.32 MIL/uL (ref 4.22–5.81)
RDW: 13.2 % (ref 11.5–15.5)
WBC Count: 7 K/uL (ref 4.0–10.5)
nRBC: 0 % (ref 0.0–0.2)

## 2024-09-24 MED ORDER — MAGNESIUM GLUCONATE 500 (27 MG) MG PO TABS: 250.0000 mg | ORAL_TABLET | Freq: Every day | ORAL | 0 refills | Status: AC

## 2024-09-24 MED ORDER — PANTOPRAZOLE SODIUM 40 MG PO TBEC: 40.0000 mg | DELAYED_RELEASE_TABLET | Freq: Every day | ORAL | 0 refills | Status: AC

## 2024-09-24 MED ORDER — DEXAMETHASONE 2 MG PO TABS: 2.0000 mg | ORAL_TABLET | Freq: Every day | ORAL | Status: DC

## 2024-09-24 MED ORDER — TEMOZOLOMIDE 140 MG PO CAPS: 75.0000 mg/m2/d | ORAL_CAPSULE | Freq: Every day | ORAL | 0 refills | Status: DC

## 2024-09-24 NOTE — Progress Notes (Signed)
 [facility] [address]  [place], [place] [address] [phone]   Interval Evaluation  Date of Service: 09/24/24 Patient Name: Joshua Harmon Patient MRN: [id number] Patient DOB: 11/06/1961 Provider: Arthea MARLA Manns, MD  Identifying Statement:  Joshua Harmon is a 63 y.o. male with right frontal glioblastoma who presents for initial consultation and evaluation.    Referring Provider: Everlene Parris LABOR, MD [address] [place],  [place] [address]  Oncologic History 07/30/24: Stereotactic biopsy with Dr. Rosslyn; path is grade 4 glioma IDH NOS  Biomarkers: Insufficient tissue from biopsy for genetic markers or NGS  Interval History: Joshua Harmon presents today for follow up, now in week 3 of radiation and Temodar.  He is tolerating treatment well overall, no significant issues.  He remains in a a wheelchair or bedbound due to left sided weakness.  Decadron  is currently at 2mg  daily.  He is no longer dosing ritalin  or baclofen  because ran out of these medications.  He continues to work with PT on his left side.    H+P (08/21/24) Patient presented to neurologic attention this month with complaint of progressive left sided weakness.  His leg was affected more than the arm, limiting his ability to walk on his own.  CNS imaging demonstrated enhancing mass within right frontal lobe consistent with primary brain tumor.  He underwent biopsy on 07/30/24 with Dr. Rosslyn; path demonstrated high grade glioma.  In the interim he has been in rehab strengthening his left side.  He is doing some walking with a walker, and his left arm strength is good now.  Dosing decadron  2mg  three times per day.  No seizures or headaches.   Medications: Current Outpatient Medications on File Prior to Visit  Medication Sig Dispense Refill   acetaminophen  (TYLENOL ) 325 MG tablet Take 2 tablets (650 mg total) by mouth every 6 (six) hours as needed for mild pain (pain score  1-3) or fever (or Fever >/= 101).     butalbital -acetaminophen -caffeine  (FIORICET) 50-325-40 MG tablet Take 2 tablets by mouth every 4 (four) hours as needed for headache (moderate pain). 14 tablet 0   ondansetron  (ZOFRAN ) 8 MG tablet Take 1 tablet (8 mg total) by mouth every 8 (eight) hours as needed for nausea or vomiting. May take 30-60 minutes prior to Temodar administration if nausea/vomiting occurs as needed. 30 tablet 1   polyethylene glycol powder (GLYCOLAX /MIRALAX ) 17 GM/SCOOP powder Dissolve 1 capful (17g) in 4-8 ounces of liquid and take by mouth daily as needed for moderate constipation. 238 g 0   senna-docusate (SENOKOT-S) 8.6-50 MG tablet Take 1 tablet by mouth at bedtime as needed for mild constipation. 30 tablet 0   temozolomide (TEMODAR) 140 MG capsule Take 1 capsule (140 mg total) by mouth daily. May take on an empty stomach to decrease nausea & vomiting. 42 capsule 0   traZODone  (DESYREL ) 50 MG tablet Take 1 tablet (50 mg total) by mouth at bedtime as needed for sleep. 30 tablet 0   No current facility-administered medications on file prior to visit.    Allergies: No Known Allergies Past Medical History:  Past Medical History:  Diagnosis Date   GERD (gastroesophageal reflux disease)    Past Surgical History:  Past Surgical History:  Procedure Laterality Date   APPLICATION OF CRANIAL NAVIGATION Right 07/30/2024   Procedure: COMPUTER-ASSISTED NAVIGATION, FOR CRANIAL PROCEDURE;  Surgeon: Joshua Dino HERO, MD;  Location: [facility] OR;  Service: Neurosurgery;  Laterality: Right;  CRANIAL NAVIGATION   LEG SURGERY Bilateral as a child   STERIOTACTIC STIMULATOR INSERTION Right 07/30/2024   Procedure: RIGHT STEREOTACTIC BIOPSY;  Surgeon: Joshua Dino HERO, MD;  Location: [facility] OR;  Service: Neurosurgery;  Laterality: Right;  RIGHT STERIOTACTIC BRAIN BIOPSY   XI ROBOTIC ASSISTED INGUINAL HERNIA REPAIR WITH MESH Right 02/19/2020   Procedure: XI ROBOTIC ASSISTED Right INGUINAL HERNIA REPAIR WITH  MESH;  Surgeon: Joshua Laneta FALCON, MD;  Location: [facility] ORS;  Service: General;  Laterality: Right;   Social History:  Social History   Socioeconomic History   Marital status: Married    Spouse name: Not on file   Number of children: 1   Years of education: Not on file   Highest education level: Not on file  Occupational History   Not on file  Tobacco Use   Smoking status: Former    Current packs/day: 0.25    Average packs/day: 0.3 packs/day for 47.8 years (12.0 ttl pk-yrs)    Types: Cigarettes    Start date: 1978   Smokeless tobacco: Never  Vaping Use   Vaping status: Never Used  Substance and Sexual Activity   Alcohol use: Not Currently    Alcohol/week: 10.0 standard drinks of alcohol    Types: 10 Cans of beer per week   Drug use: Not Currently   Sexual activity: Yes    Partners: Female  Other Topics Concern   Not on file  Social History Narrative   Not on file   Social Drivers of Health   Financial Resource Strain: Medium Risk (05/07/2024)   Received from [facility]   Overall Financial Resource Strain (CARDIA)    Difficulty of Paying Living Expenses: Somewhat hard  Food Insecurity: Food Insecurity Present (08/24/2024)   Hunger Vital Sign    Worried About Running Out of Food in the Last Year: Never true    Ran Out of Food in the Last Year: Sometimes true  Transportation Needs: No Transportation Needs (08/24/2024)   PRAPARE - Administrator, Civil Service (Medical): No    Lack of Transportation (Non-Medical): No  Physical Activity: Not on file  Stress: Not on file  Social Connections: Unknown (07/25/2024)   Social Connection and Isolation Panel    Frequency of Communication with Friends and Family: Patient declined    Frequency of Social Gatherings with Friends and Family: Patient declined    Attends Religious Services: Patient declined    Database Administrator or Organizations: Patient declined    Attends Banker Meetings:  Patient declined    Marital Status: Living with partner  Intimate Partner Violence: Not At Risk (08/24/2024)   Humiliation, Afraid, Rape, and Kick questionnaire    Fear of Current or Ex-Partner: No    Emotionally Abused: No    Physically Abused: No    Sexually Abused: No   Family History:  Family History  Problem Relation Age of Onset   Dementia Mother    Dementia Father    Dementia Maternal Aunt     Review of Systems: Constitutional: Doesn't report fevers, chills or abnormal weight loss Eyes: Doesn't report blurriness of vision Ears, nose, mouth, throat, and face: Doesn't report sore throat Respiratory: Doesn't report cough, dyspnea or wheezes Cardiovascular: Doesn't report palpitation, chest discomfort  Gastrointestinal:  Doesn't report nausea, constipation, diarrhea GU: Doesn't report incontinence Skin: Doesn't report skin rashes Neurological: Per HPI Musculoskeletal: Doesn't report joint pain Behavioral/Psych: Doesn't report anxiety  Physical Exam: Wt Readings from Last  3 Encounters:  08/31/24 143 lb (64.9 kg)  08/14/24 153 lb 3.5 oz (69.5 kg)  08/03/24 147 lb 14.9 oz (67.1 kg)   Temp Readings from Last 3 Encounters:  08/31/24 98.2 F (36.8 C) (Oral)  08/21/24 98.4 F (36.9 C)  08/21/24 98 F (36.7 C) (Oral)   BP Readings from Last 3 Encounters:  08/31/24 121/86  08/21/24 117/87  08/21/24 114/79   Pulse Readings from Last 3 Encounters:  08/31/24 78  08/21/24 95  08/21/24 62    KPS: 60. General: Alert, cooperative, pleasant, in no acute distress Head: Normal EENT: No conjunctival injection or scleral icterus.  Lungs: Resp effort normal Cardiac: Regular rate Abdomen: Non-distended abdomen Skin: No rashes cyanosis or petechiae. Extremities: No clubbing or edema  Neurologic Exam: Mental Status: Awake, alert, attentive to examiner. Oriented to self and environment. Language is fluent with intact comprehension.  Cranial Nerves: Visual acuity is grossly  normal. Visual fields are full. Extra-ocular movements intact. No ptosis. Face is symmetric Motor: Tone and bulk are normal. Power is 4/5 in right leg, 4/5 in right arm. Reflexes are symmetric, no pathologic reflexes present.  Sensory: Intact to light touch Gait: Non ambulatory   Labs: I have reviewed the data as listed    Component Value Date/Time   NA 136 09/24/2024 1544   K 5.0 09/24/2024 1544   CL 101 09/24/2024 1544   CO2 30 09/24/2024 1544   GLUCOSE 100 (H) 09/24/2024 1544   BUN 16 09/24/2024 1544   CREATININE 0.92 09/24/2024 1544   CALCIUM 9.8 09/24/2024 1544   PROT 7.4 09/24/2024 1544   ALBUMIN 3.9 09/24/2024 1544   AST 16 09/24/2024 1544   ALT 23 09/24/2024 1544   ALKPHOS 67 09/24/2024 1544   BILITOT 0.4 09/24/2024 1544   GFRNONAA >60 09/24/2024 1544   GFRAA >60 01/29/2020 1418   Lab Results  Component Value Date   WBC 7.0 09/24/2024   NEUTROABS 5.7 09/24/2024   HGB 16.4 09/24/2024   HCT 47.7 09/24/2024   MCV 89.7 09/24/2024   PLT 259 09/24/2024      Assessment/Plan Glioblastoma (HCC) - Plan: temozolomide (TEMODAR) 140 MG capsule  Mercy C Conkel is clinically stable today, now having completed 2 weeks of radiation and Temozolomide.  Labs are within normal limits.  We ultimately recommended continuing with course of intensity modulated radiation therapy and concurrent daily Temozolomide.  Radiation will be administered Mon-Fri over 6 weeks, Temodar will be dosed at 75mg /m2 to be given daily over 42 days.  We reviewed side effects of temodar, including fatigue, nausea/vomiting, constipation, and cytopenias.  Chemotherapy should be held for the following:  ANC less than 1,000  Platelets less than 100,000  LFT or creatinine greater than 2x ULN  If clinical concerns/contraindications develop  Every 2 weeks during radiation, labs will be checked accompanied by a clinical evaluation in the brain tumor clinic.  Decadron  may continue 2mg  daily as this has  helped with appetite; he has experienced some weight loss in the past month.  May discontinue Ritalin , Baclofen , as these medications were given during rehab admission and have less utility at this time.  Ritalin  is a concern with weight/appetite, and he is not complaining of spasticity.    Requested refill of Temozolomide through [facility], forwarded to oral chemotherapy pharmacist as well.  All questions were answered. The patient knows to call the clinic with any problems, questions or concerns. No barriers to learning were detected.  The total time spent in the encounter  was 30 minutes and more than 50% was on counseling and review of test results   Arthea MARLA Manns, MD Medical Director of Neuro-Oncology [facility] at [facility] 09/24/24 4:36 PM

## 2024-09-25 ENCOUNTER — Other Ambulatory Visit: Payer: Self-pay

## 2024-09-25 ENCOUNTER — Ambulatory Visit
Admission: RE | Admit: 2024-09-25 | Discharge: 2024-09-25 | Disposition: A | Source: Ambulatory Visit | Attending: Radiation Oncology

## 2024-09-25 ENCOUNTER — Telehealth: Payer: Self-pay | Admitting: Pharmacist

## 2024-09-25 LAB — RAD ONC ARIA SESSION SUMMARY
Course Elapsed Days: 19
Plan Fractions Treated to Date: 14
Plan Prescribed Dose Per Fraction: 2 Gy
Plan Total Fractions Prescribed: 23
Plan Total Prescribed Dose: 46 Gy
Reference Point Dosage Given to Date: 28 Gy
Reference Point Session Dosage Given: 2 Gy
Session Number: 14

## 2024-09-25 NOTE — Telephone Encounter (Signed)
 Oral Oncology Pharmacist Encounter  Patient needs to call [facility] to refill remaining #21 temozolomide 140 mg capsules to complete full 42 day supply. Per [facility] provider portal, [facility] last attempted to reach out to patient on 09/14/24 to schedule remaining #21 TMZ to be shipped to patient's home. Will inform patient to call [phone] to fill remaining amount.   Joshua Harmon, PharmD, BCPS, BCOP Hematology/Oncology Clinical Pharmacist [phone] 09/25/2024 8:16 AM

## 2024-09-26 ENCOUNTER — Ambulatory Visit

## 2024-09-27 ENCOUNTER — Ambulatory Visit
Admission: RE | Admit: 2024-09-27 | Discharge: 2024-09-27 | Disposition: A | Source: Ambulatory Visit | Attending: Radiation Oncology

## 2024-09-27 ENCOUNTER — Other Ambulatory Visit: Payer: Self-pay

## 2024-09-27 LAB — RAD ONC ARIA SESSION SUMMARY
Course Elapsed Days: 21
Plan Fractions Treated to Date: 15
Plan Prescribed Dose Per Fraction: 2 Gy
Plan Total Fractions Prescribed: 23
Plan Total Prescribed Dose: 46 Gy
Reference Point Dosage Given to Date: 30 Gy
Reference Point Session Dosage Given: 2 Gy
Session Number: 15

## 2024-09-28 ENCOUNTER — Other Ambulatory Visit: Payer: Self-pay

## 2024-09-28 ENCOUNTER — Ambulatory Visit
Admission: RE | Admit: 2024-09-28 | Discharge: 2024-09-28 | Disposition: A | Discharge status: Home / Self Care | Source: Ambulatory Visit | Attending: Radiation Oncology | Admitting: Radiation Oncology

## 2024-09-28 LAB — RAD ONC ARIA SESSION SUMMARY
Course Elapsed Days: 22
Plan Fractions Treated to Date: 16
Plan Prescribed Dose Per Fraction: 2 Gy
Plan Total Fractions Prescribed: 23
Plan Total Prescribed Dose: 46 Gy
Reference Point Dosage Given to Date: 32 Gy
Reference Point Session Dosage Given: 2 Gy
Session Number: 16

## 2024-10-01 ENCOUNTER — Other Ambulatory Visit: Payer: Self-pay

## 2024-10-01 ENCOUNTER — Other Ambulatory Visit: Payer: Self-pay | Admitting: Pharmacist

## 2024-10-01 ENCOUNTER — Ambulatory Visit
Admission: RE | Admit: 2024-10-01 | Discharge: 2024-10-01 | Disposition: A | Discharge status: Home / Self Care | Source: Ambulatory Visit | Attending: Radiation Oncology | Admitting: Radiation Oncology

## 2024-10-01 ENCOUNTER — Other Ambulatory Visit: Payer: Self-pay | Admitting: Internal Medicine

## 2024-10-01 DIAGNOSIS — C719 Malignant neoplasm of brain, unspecified: Secondary | ICD-10-CM

## 2024-10-01 LAB — RAD ONC ARIA SESSION SUMMARY
Course Elapsed Days: 25
Plan Fractions Treated to Date: 17
Plan Prescribed Dose Per Fraction: 2 Gy
Plan Total Fractions Prescribed: 23
Plan Total Prescribed Dose: 46 Gy
Reference Point Dosage Given to Date: 34 Gy
Reference Point Session Dosage Given: 2 Gy
Session Number: 17

## 2024-10-01 NOTE — Progress Notes (Signed)
 Oral Oncology Pharmacist Encounter  Temozolomide refill sent on 10/01/24 for #21 has been canceled as this refill request was incorrectly sent by [facility]. Patient has had full #42 day supply dispensed to them for the TMZ/XRT portion of their treatment (remaining #21 temozolomide capsules were shipped and delivered to patient's home on 09/27/24). Will request [facility] stop sending further refill request at this time.   Joshua Harmon, PharmD, BCPS, BCOP Hematology/Oncology Clinical Pharmacist [phone] 10/01/2024 12:02 PM

## 2024-10-02 ENCOUNTER — Other Ambulatory Visit: Payer: Self-pay

## 2024-10-02 ENCOUNTER — Inpatient Hospital Stay (HOSPITAL_BASED_OUTPATIENT_CLINIC_OR_DEPARTMENT_OTHER): Admitting: Internal Medicine

## 2024-10-02 ENCOUNTER — Inpatient Hospital Stay

## 2024-10-02 ENCOUNTER — Ambulatory Visit
Admission: RE | Admit: 2024-10-02 | Discharge: 2024-10-02 | Disposition: A | Discharge status: Home / Self Care | Source: Ambulatory Visit | Attending: Radiation Oncology | Admitting: Radiation Oncology

## 2024-10-02 VITALS — BP 98/78 | HR 88 | Temp 98.0°F | Resp 17 | Ht 73.0 in | Wt 149.0 lb

## 2024-10-02 DIAGNOSIS — C719 Malignant neoplasm of brain, unspecified: Secondary | ICD-10-CM

## 2024-10-02 LAB — CBC WITH DIFFERENTIAL (CANCER CENTER ONLY)
Abs Immature Granulocytes: 0.02 K/uL (ref 0.00–0.07)
Basophils Absolute: 0 K/uL (ref 0.0–0.1)
Basophils Relative: 0 %
Eosinophils Absolute: 0.5 K/uL (ref 0.0–0.5)
Eosinophils Relative: 11 %
HCT: 47 % (ref 39.0–52.0)
Hemoglobin: 16.3 g/dL (ref 13.0–17.0)
Immature Granulocytes: 0 %
Lymphocytes Relative: 18 %
Lymphs Abs: 0.8 K/uL (ref 0.7–4.0)
MCH: 31.2 pg (ref 26.0–34.0)
MCHC: 34.7 g/dL (ref 30.0–36.0)
MCV: 90 fL (ref 80.0–100.0)
Monocytes Absolute: 0.8 K/uL (ref 0.1–1.0)
Monocytes Relative: 17 %
Neutro Abs: 2.4 K/uL (ref 1.7–7.7)
Neutrophils Relative %: 54 %
Platelet Count: 92 K/uL — ABNORMAL LOW (ref 150–400)
RBC: 5.22 MIL/uL (ref 4.22–5.81)
RDW: 13.4 % (ref 11.5–15.5)
Smear Review: NORMAL
WBC Count: 4.5 K/uL (ref 4.0–10.5)
nRBC: 0 % (ref 0.0–0.2)

## 2024-10-02 LAB — CMP (CANCER CENTER ONLY)
ALT: 16 U/L (ref 0–44)
AST: 13 U/L — ABNORMAL LOW (ref 15–41)
Albumin: 3.6 g/dL (ref 3.5–5.0)
Alkaline Phosphatase: 60 U/L (ref 38–126)
Anion gap: 6 (ref 5–15)
BUN: 13 mg/dL (ref 8–23)
CO2: 30 mmol/L (ref 22–32)
Calcium: 9.5 mg/dL (ref 8.9–10.3)
Chloride: 100 mmol/L (ref 98–111)
Creatinine: 0.89 mg/dL (ref 0.61–1.24)
GFR, Estimated: >60 mL/min (ref >60)
Glucose, Bld: 83 mg/dL (ref 70–99)
Potassium: 4 mmol/L (ref 3.5–5.1)
Sodium: 136 mmol/L (ref 135–145)
Total Bilirubin: 0.6 mg/dL (ref 0.0–1.2)
Total Protein: 7 g/dL (ref 6.5–8.1)

## 2024-10-02 LAB — RAD ONC ARIA SESSION SUMMARY
Course Elapsed Days: 26
Plan Fractions Treated to Date: 18
Plan Prescribed Dose Per Fraction: 2 Gy
Plan Total Fractions Prescribed: 23
Plan Total Prescribed Dose: 46 Gy
Reference Point Dosage Given to Date: 36 Gy
Reference Point Session Dosage Given: 2 Gy
Session Number: 18

## 2024-10-02 NOTE — Progress Notes (Signed)
 [facility] [address]  [place], [place] [address] [phone]   Interval Evaluation  Date of Service: 10/02/24 Patient Name: Joshua Harmon Patient MRN: [id number] Patient DOB: 23-Mar-1961 Provider: Arthea MARLA Manns, MD  Identifying Statement:  Joshua Harmon is a 63 y.o. male with right frontal glioblastoma who presents for initial consultation and evaluation.    Referring Provider: Everlene Parris LABOR, MD [address] [place],  [place] [address]  Oncologic History 07/30/24: Stereotactic biopsy with Dr. Rosslyn; path is grade 4 glioma IDH NOS  Biomarkers: Insufficient tissue from biopsy for genetic markers or NGS  Interval History: [name] presents today for follow up, now in week 4 of radiation and Temodar.  He is tolerating treatment well overall, no reported neurologic changes today.  He remains in a a wheelchair or bedbound due to left sided weakness.  Decadron  is currently at 2mg  daily.  He is no longer dosing ritalin  or baclofen  after visit last week.  He continues to work with PT on his left side.    H+P (08/21/24) Patient presented to neurologic attention this month with complaint of progressive left sided weakness.  His leg was affected more than the arm, limiting his ability to walk on his own.  CNS imaging demonstrated enhancing mass within right frontal lobe consistent with primary brain tumor.  He underwent biopsy on 07/30/24 with Dr. Rosslyn; path demonstrated high grade glioma.  In the interim he has been in rehab strengthening his left side.  He is doing some walking with a walker, and his left arm strength is good now.  Dosing decadron  2mg  three times per day.  No seizures or headaches.   Medications: Current Outpatient Medications on File Prior to Visit  Medication Sig Dispense Refill   acetaminophen  (TYLENOL ) 325 MG tablet Take 2 tablets (650 mg total) by mouth every 6 (six) hours as needed for mild pain (pain score 1-3)  or fever (or Fever >/= 101).     butalbital -acetaminophen -caffeine  (FIORICET) 50-325-40 MG tablet Take 2 tablets by mouth every 4 (four) hours as needed for headache (moderate pain). 14 tablet 0   dexamethasone  (DECADRON ) 2 MG tablet Take 1 tablet (2 mg total) by mouth daily.     magnesium gluconate (MAGONATE) 500 (27 Mg) MG TABS tablet Take 0.5 tablets (250 mg total) by mouth daily. 30 tablet 0   ondansetron  (ZOFRAN ) 8 MG tablet Take 1 tablet (8 mg total) by mouth every 8 (eight) hours as needed for nausea or vomiting. May take 30-60 minutes prior to Temodar administration if nausea/vomiting occurs as needed. 30 tablet 1   pantoprazole  (PROTONIX ) 40 MG tablet Take 1 tablet (40 mg total) by mouth daily. 30 tablet 0   polyethylene glycol powder (GLYCOLAX /MIRALAX ) 17 GM/SCOOP powder Dissolve 1 capful (17g) in 4-8 ounces of liquid and take by mouth daily as needed for moderate constipation. 238 g 0   senna-docusate (SENOKOT-S) 8.6-50 MG tablet Take 1 tablet by mouth at bedtime as needed for mild constipation. 30 tablet 0   traZODone  (DESYREL ) 50 MG tablet Take 1 tablet (50 mg total) by mouth at bedtime as needed for sleep. 30 tablet 0   No current facility-administered medications on file prior to visit.    Allergies: No Known Allergies Past Medical History:  Past Medical History:  Diagnosis Date   GERD (gastroesophageal reflux disease)    Past Surgical History:  Past Surgical History:  Procedure Laterality Date   APPLICATION  OF CRANIAL NAVIGATION Right 07/30/2024   Procedure: COMPUTER-ASSISTED NAVIGATION, FOR CRANIAL PROCEDURE;  Surgeon: Rosslyn Dino HERO, MD;  Location: [facility] OR;  Service: Neurosurgery;  Laterality: Right;  CRANIAL NAVIGATION   LEG SURGERY Bilateral as a child   STERIOTACTIC STIMULATOR INSERTION Right 07/30/2024   Procedure: RIGHT STEREOTACTIC BIOPSY;  Surgeon: Rosslyn Dino HERO, MD;  Location: [facility] OR;  Service: Neurosurgery;  Laterality: Right;  RIGHT STERIOTACTIC BRAIN BIOPSY   XI  ROBOTIC ASSISTED INGUINAL HERNIA REPAIR WITH MESH Right 02/19/2020   Procedure: XI ROBOTIC ASSISTED Right INGUINAL HERNIA REPAIR WITH MESH;  Surgeon: Jordis Laneta FALCON, MD;  Location: [facility] ORS;  Service: General;  Laterality: Right;   Social History:  Social History   Socioeconomic History   Marital status: Married    Spouse name: Not on file   Number of children: 1   Years of education: Not on file   Highest education level: Not on file  Occupational History   Not on file  Tobacco Use   Smoking status: Former    Current packs/day: 0.25    Average packs/day: 0.2 packs/day for 47.9 years (12.0 ttl pk-yrs)    Types: Cigarettes    Start date: 1978   Smokeless tobacco: Never  Vaping Use   Vaping status: Never Used  Substance and Sexual Activity   Alcohol use: Not Currently    Alcohol/week: 10.0 standard drinks of alcohol    Types: 10 Cans of beer per week   Drug use: Not Currently   Sexual activity: Yes    Partners: Female  Other Topics Concern   Not on file  Social History Narrative   Not on file   Social Drivers of Health   Financial Resource Strain: Medium Risk (05/07/2024)   Received from [facility]   Overall Financial Resource Strain (CARDIA)    Difficulty of Paying Living Expenses: Somewhat hard  Food Insecurity: Food Insecurity Present (08/24/2024)   Hunger Vital Sign    Worried About Running Out of Food in the Last Year: Never true    Ran Out of Food in the Last Year: Sometimes true  Transportation Needs: No Transportation Needs (08/24/2024)   PRAPARE - Administrator, Civil Service (Medical): No    Lack of Transportation (Non-Medical): No  Physical Activity: Not on file  Stress: Not on file  Social Connections: Unknown (07/25/2024)   Social Connection and Isolation Panel    Frequency of Communication with Friends and Family: Patient declined    Frequency of Social Gatherings with Friends and Family: Patient declined    Attends Religious  Services: Patient declined    Database Administrator or Organizations: Patient declined    Attends Banker Meetings: Patient declined    Marital Status: Living with partner  Intimate Partner Violence: Not At Risk (08/24/2024)   Humiliation, Afraid, Rape, and Kick questionnaire    Fear of Current or Ex-Partner: No    Emotionally Abused: No    Physically Abused: No    Sexually Abused: No   Family History:  Family History  Problem Relation Age of Onset   Dementia Mother    Dementia Father    Dementia Maternal Aunt     Review of Systems: Constitutional: Doesn't report fevers, chills or abnormal weight loss Eyes: Doesn't report blurriness of vision Ears, nose, mouth, throat, and face: Doesn't report sore throat Respiratory: Doesn't report cough, dyspnea or wheezes Cardiovascular: Doesn't report palpitation, chest discomfort  Gastrointestinal:  Doesn't report nausea,  constipation, diarrhea GU: Doesn't report incontinence Skin: Doesn't report skin rashes Neurological: Per HPI Musculoskeletal: Doesn't report joint pain Behavioral/Psych: Doesn't report anxiety  Physical Exam: Wt Readings from Last 3 Encounters:  10/02/24 149 lb (67.6 kg)  08/31/24 143 lb (64.9 kg)  08/14/24 153 lb 3.5 oz (69.5 kg)   Temp Readings from Last 3 Encounters:  10/02/24 98 F (36.7 C) (Temporal)  08/31/24 98.2 F (36.8 C) (Oral)  08/21/24 98.4 F (36.9 C)   BP Readings from Last 3 Encounters:  10/02/24 98/78  08/31/24 121/86  08/21/24 117/87   Pulse Readings from Last 3 Encounters:  10/02/24 88  08/31/24 78  08/21/24 95    KPS: 60. General: Alert, cooperative, pleasant, in no acute distress Head: Normal EENT: No conjunctival injection or scleral icterus.  Lungs: Resp effort normal Cardiac: Regular rate Abdomen: Non-distended abdomen Skin: No rashes cyanosis or petechiae. Extremities: No clubbing or edema  Neurologic Exam: Mental Status: Awake, alert, attentive to  examiner. Oriented to self and environment. Language is fluent with intact comprehension.  Cranial Nerves: Visual acuity is grossly normal. Visual fields are full. Extra-ocular movements intact. No ptosis. Face is symmetric Motor: Tone and bulk are normal. Power is 3/5 in left leg, 3/5 in left arm. Reflexes are symmetric, no pathologic reflexes present.  Sensory: Intact to light touch Gait: Non ambulatory   Labs: I have reviewed the data as listed    Component Value Date/Time   NA 136 09/24/2024 1544   K 5.0 09/24/2024 1544   CL 101 09/24/2024 1544   CO2 30 09/24/2024 1544   GLUCOSE 100 (H) 09/24/2024 1544   BUN 16 09/24/2024 1544   CREATININE 0.92 09/24/2024 1544   CALCIUM 9.8 09/24/2024 1544   PROT 7.4 09/24/2024 1544   ALBUMIN 3.9 09/24/2024 1544   AST 16 09/24/2024 1544   ALT 23 09/24/2024 1544   ALKPHOS 67 09/24/2024 1544   BILITOT 0.4 09/24/2024 1544   GFRNONAA >60 09/24/2024 1544   GFRAA >60 01/29/2020 1418   Lab Results  Component Value Date   WBC 7.0 09/24/2024   NEUTROABS 5.7 09/24/2024   HGB 16.4 09/24/2024   HCT 47.7 09/24/2024   MCV 89.7 09/24/2024   PLT 259 09/24/2024      Assessment/Plan No diagnosis found.  Boden Stucky Malkowski is clinically stable today, now having completed 4 weeks of radiation and Temozolomide.  Labs are still pending for today.  We ultimately recommended continuing with course of intensity modulated radiation therapy and concurrent daily Temozolomide.  Radiation will be administered Mon-Fri over 6 weeks, Temodar will be dosed at 75mg /m2 to be given daily over 42 days.  We reviewed side effects of temodar, including fatigue, nausea/vomiting, constipation, and cytopenias.  Temozolomide will be held for final 2 weeks due to thrombocytopenia.  This was communicated to his wife, Andres via telephone.  Chemotherapy should be held for the following:  ANC less than 1,000  Platelets less than 100,000  LFT or creatinine greater than 2x  ULN  If clinical concerns/contraindications develop  Every 2 weeks during radiation, labs will be checked accompanied by a clinical evaluation in the brain tumor clinic.  Decadron  may continue 2mg  daily as this has helped with appetite; he has experienced some weight loss in the past month.  Will remain off Ritalin , Baclofen   All questions were answered. The patient knows to call the clinic with any problems, questions or concerns. No barriers to learning were detected.  The total time spent in the  encounter was 30 minutes and more than 50% was on counseling and review of test results   Arthea MARLA Manns, MD Medical Director of Neuro-Oncology [facility] at [facility] 10/02/24 4:02 PM

## 2024-10-03 ENCOUNTER — Ambulatory Visit
Admission: RE | Admit: 2024-10-03 | Discharge: 2024-10-03 | Disposition: A | Discharge status: Home / Self Care | Source: Ambulatory Visit | Attending: Radiation Oncology | Admitting: Radiation Oncology

## 2024-10-03 ENCOUNTER — Other Ambulatory Visit: Payer: Self-pay

## 2024-10-03 LAB — RAD ONC ARIA SESSION SUMMARY
Course Elapsed Days: 27
Plan Fractions Treated to Date: 19
Plan Prescribed Dose Per Fraction: 2 Gy
Plan Total Fractions Prescribed: 23
Plan Total Prescribed Dose: 46 Gy
Reference Point Dosage Given to Date: 38 Gy
Reference Point Session Dosage Given: 2 Gy
Session Number: 19

## 2024-10-04 ENCOUNTER — Other Ambulatory Visit: Payer: Self-pay

## 2024-10-04 ENCOUNTER — Ambulatory Visit
Admission: RE | Admit: 2024-10-04 | Discharge: 2024-10-04 | Disposition: A | Source: Ambulatory Visit | Attending: Radiation Oncology

## 2024-10-04 LAB — RAD ONC ARIA SESSION SUMMARY
Course Elapsed Days: 28
Plan Fractions Treated to Date: 20
Plan Prescribed Dose Per Fraction: 2 Gy
Plan Total Fractions Prescribed: 23
Plan Total Prescribed Dose: 46 Gy
Reference Point Dosage Given to Date: 40 Gy
Reference Point Session Dosage Given: 2 Gy
Session Number: 20

## 2024-10-05 ENCOUNTER — Other Ambulatory Visit: Payer: Self-pay

## 2024-10-05 ENCOUNTER — Ambulatory Visit
Admission: RE | Admit: 2024-10-05 | Discharge: 2024-10-05 | Disposition: A | Discharge status: Home / Self Care | Source: Ambulatory Visit | Attending: Radiation Oncology | Admitting: Radiation Oncology

## 2024-10-05 LAB — RAD ONC ARIA SESSION SUMMARY
Course Elapsed Days: 29
Plan Fractions Treated to Date: 21
Plan Prescribed Dose Per Fraction: 2 Gy
Plan Total Fractions Prescribed: 23
Plan Total Prescribed Dose: 46 Gy
Reference Point Dosage Given to Date: 42 Gy
Reference Point Session Dosage Given: 2 Gy
Session Number: 21

## 2024-10-08 ENCOUNTER — Ambulatory Visit
Admission: RE | Admit: 2024-10-08 | Discharge: 2024-10-08 | Disposition: A | Source: Ambulatory Visit | Attending: Radiation Oncology

## 2024-10-08 ENCOUNTER — Other Ambulatory Visit: Payer: Self-pay

## 2024-10-08 LAB — RAD ONC ARIA SESSION SUMMARY
Course Elapsed Days: 32
Plan Fractions Treated to Date: 22
Plan Prescribed Dose Per Fraction: 2 Gy
Plan Total Fractions Prescribed: 23
Plan Total Prescribed Dose: 46 Gy
Reference Point Dosage Given to Date: 44 Gy
Reference Point Session Dosage Given: 2 Gy
Session Number: 22

## 2024-10-09 ENCOUNTER — Other Ambulatory Visit: Payer: Self-pay

## 2024-10-09 ENCOUNTER — Ambulatory Visit
Admission: RE | Admit: 2024-10-09 | Discharge: 2024-10-09 | Disposition: A | Source: Ambulatory Visit | Attending: Radiation Oncology

## 2024-10-09 DIAGNOSIS — C719 Malignant neoplasm of brain, unspecified: Secondary | ICD-10-CM

## 2024-10-09 LAB — RAD ONC ARIA SESSION SUMMARY
Course Elapsed Days: 33
Plan Fractions Treated to Date: 23
Plan Prescribed Dose Per Fraction: 2 Gy
Plan Total Fractions Prescribed: 23
Plan Total Prescribed Dose: 46 Gy
Reference Point Dosage Given to Date: 46 Gy
Reference Point Session Dosage Given: 2 Gy
Session Number: 23

## 2024-10-10 ENCOUNTER — Ambulatory Visit
Admission: RE | Admit: 2024-10-10 | Discharge: 2024-10-10 | Disposition: A | Discharge status: Home / Self Care | Source: Ambulatory Visit | Attending: Radiation Oncology | Admitting: Radiation Oncology

## 2024-10-10 ENCOUNTER — Inpatient Hospital Stay

## 2024-10-10 ENCOUNTER — Other Ambulatory Visit: Payer: Self-pay

## 2024-10-10 ENCOUNTER — Inpatient Hospital Stay (HOSPITAL_BASED_OUTPATIENT_CLINIC_OR_DEPARTMENT_OTHER): Admitting: Physician Assistant

## 2024-10-10 VITALS — BP 104/77 | HR 92 | Temp 98.9°F | Resp 20

## 2024-10-10 DIAGNOSIS — N390 Urinary tract infection, site not specified: Secondary | ICD-10-CM

## 2024-10-10 DIAGNOSIS — D696 Thrombocytopenia, unspecified: Secondary | ICD-10-CM | POA: Diagnosis not present

## 2024-10-10 DIAGNOSIS — C719 Malignant neoplasm of brain, unspecified: Secondary | ICD-10-CM | POA: Diagnosis not present

## 2024-10-10 DIAGNOSIS — R319 Hematuria, unspecified: Secondary | ICD-10-CM | POA: Diagnosis not present

## 2024-10-10 LAB — URINALYSIS, COMPLETE (UACMP) WITH MICROSCOPIC
Bilirubin Urine: NEGATIVE
Glucose, UA: NEGATIVE mg/dL
Ketones, ur: NEGATIVE mg/dL
Nitrite: NEGATIVE
Protein, ur: 100 mg/dL — AB
RBC / HPF: >50 RBC/hpf (ref 0–5)
Specific Gravity, Urine: 1.019 (ref 1.005–1.030)
WBC, UA: >50 WBC/hpf (ref 0–5)
pH: 8 (ref 5.0–8.0)

## 2024-10-10 LAB — RAD ONC ARIA SESSION SUMMARY
Course Elapsed Days: 34
Plan Fractions Treated to Date: 1
Plan Prescribed Dose Per Fraction: 2 Gy
Plan Total Fractions Prescribed: 7
Plan Total Prescribed Dose: 14 Gy
Reference Point Dosage Given to Date: 2 Gy
Reference Point Session Dosage Given: 2 Gy
Session Number: 24

## 2024-10-10 LAB — CMP (CANCER CENTER ONLY)
ALT: 19 U/L (ref 0–44)
AST: 16 U/L (ref 15–41)
Albumin: 3.9 g/dL (ref 3.5–5.0)
Alkaline Phosphatase: 77 U/L (ref 38–126)
Anion gap: 9 (ref 5–15)
BUN: 13 mg/dL (ref 8–23)
CO2: 30 mmol/L (ref 22–32)
Calcium: 9.9 mg/dL (ref 8.9–10.3)
Chloride: 97 mmol/L — ABNORMAL LOW (ref 98–111)
Creatinine: 0.75 mg/dL (ref 0.61–1.24)
GFR, Estimated: >60 mL/min (ref >60)
Glucose, Bld: 83 mg/dL (ref 70–99)
Potassium: 4.5 mmol/L (ref 3.5–5.1)
Sodium: 137 mmol/L (ref 135–145)
Total Bilirubin: 0.5 mg/dL (ref 0.0–1.2)
Total Protein: 7.5 g/dL (ref 6.5–8.1)

## 2024-10-10 LAB — CBC WITH DIFFERENTIAL (CANCER CENTER ONLY)
Abs Immature Granulocytes: 0.01 K/uL (ref 0.00–0.07)
Basophils Absolute: 0 K/uL (ref 0.0–0.1)
Basophils Relative: 0 %
Eosinophils Absolute: 0.1 K/uL (ref 0.0–0.5)
Eosinophils Relative: 3 %
HCT: 45.9 % (ref 39.0–52.0)
Hemoglobin: 15.8 g/dL (ref 13.0–17.0)
Immature Granulocytes: 0 %
Lymphocytes Relative: 13 %
Lymphs Abs: 0.5 K/uL — ABNORMAL LOW (ref 0.7–4.0)
MCH: 30.9 pg (ref 26.0–34.0)
MCHC: 34.4 g/dL (ref 30.0–36.0)
MCV: 89.8 fL (ref 80.0–100.0)
Monocytes Absolute: 0.3 K/uL (ref 0.1–1.0)
Monocytes Relative: 8 %
Neutro Abs: 3 K/uL (ref 1.7–7.7)
Neutrophils Relative %: 76 %
Platelet Count: 27 K/uL — ABNORMAL LOW (ref 150–400)
RBC: 5.11 MIL/uL (ref 4.22–5.81)
RDW: 13.3 % (ref 11.5–15.5)
WBC Count: 4 K/uL (ref 4.0–10.5)
nRBC: 0 % (ref 0.0–0.2)

## 2024-10-10 MED ORDER — NITROFURANTOIN MONOHYD MACRO 100 MG PO CAPS: 100.0000 mg | ORAL_CAPSULE | Freq: Two times a day (BID) | ORAL | 0 refills | Status: DC

## 2024-10-10 MED ORDER — BUTALBITAL-APAP-CAFFEINE 50-325-40 MG PO TABS: 2.0000 | ORAL_TABLET | ORAL | 0 refills | Status: AC | PRN

## 2024-10-10 NOTE — Progress Notes (Signed)
 Symptom Management Consult Note [facility]    Patient Care Team: Default, Provider, MD as PCP - General    Name / MRN / DOB: Joshua Harmon  [id number]  Jan 01, 1961   Date of visit: 10/10/2024   Chief Complaint/Reason for visit: hematuria   Current Therapy: radiation and Temodar   ASSESSMENT AND PLAN Patient is a 63 y.o. male with oncologic history of right frontal glioblastoma followed by Dr. Buckley.  I have viewed most recent oncology note and lab work.  #Right frontal glioblastoma  - Next appointment with oncologist is 10/16/24 - Will refill Fioricet per patient's request. Having intermittent headaches, no new neuro symptoms.   #Hematuria - UA shows infection today.  Hemoglobin and kidney function is stable. - Patient afebrile, well appearing. Benign abdominal exam and no CVA tenderness. UA provided in clinic is light orange in color. No clots in urinal. - Discussed with patient and significant other that this could be the cause of his hematuria however without additional imaging I cannot rule out any intra-abdominal pathology vs hematoma/hemorrhage/bleed. - Chart review shows patient had CT chest abdomen pelvis on 07/25/2024, this is the most recent imaging of his abdomen.  That scan did not show any renal masses or stones.  No hydronephrosis or bladder issues. - Shared decision making discussion had with patient and significant other. They are agreeable with plan for PO antibiotics for UTI and if no improvement or worsening symptoms they will seek ED evaluation.  Prescription sent to pharmacy for macrobid. I will follow up on urine culture as well.  #Thrombocytopenia - Platelets are low at 27k. This is lower compared to labs x 8 days ago at which time patient was advised to hold Temodar as AE is cytopenias. - I confirmed with significant other that patient is holding Temodar still which she confirmed.  - Patient still prefers to hold off on additional work up  at this time and try antibiotic for UTI.  - Lengthy discussion had regarding bleeding precautions. Will forward results to oncologist to make him aware, he is not in office today.   Strict ED precautions discussed should symptoms worsen.   HEME/ONC HISTORY Oncology History  Glioblastoma (HCC)  07/30/2024 Initial Diagnosis   Glioblastoma (HCC)   08/27/2024 -  Chemotherapy   Patient is on Treatment Plan : BRAIN GLIOBLASTOMA Radiation Therapy With Concurrent Temozolomide 75 mg/m2 Daily Followed By Sequential Maintenance Temozolomide x 6-12 cycles         INTERVAL HISTORY  Discussed the use of AI scribe software for clinical note transcription with the patient, who gave verbal consent to proceed.    Joshua Harmon is a 63 y.o. male with oncologic history as above presenting to [facility] today with chief complaint of hematuria.  Patient presents to clinic alone by transportation service.  Additional history obtained via telephone from significant other [name].   Orie reports that yesterday she noticed patient had blood in his urine.  She describes it as looking like a blood orange.  She also noticed a small blood clot in the urine.  Patient always voids in the urinal.  He did seem uncomfortable as he was near completion of voiding yesterday.  Denies history of similar symptoms when she does not call history of frequent UTIs.  She does note that his appetite has been lower over the last 2 weeks but he has been doing well consuming fluids.  He drinks things like orange juice, Ensure water and she  recently purchased cranberry juice for him to try.  He has not complained of any nausea and has not had any vomiting.  Patient has not reported any back pain. Denies any easy bruising or bleeding gums. Besides hematuria no signs of bleeding per patient.     ROS  All other systems are reviewed and are negative for acute change except as noted in the HPI.    No Known Allergies   Past Medical History:   Diagnosis Date   GERD (gastroesophageal reflux disease)      Past Surgical History:  Procedure Laterality Date   APPLICATION OF CRANIAL NAVIGATION Right 07/30/2024   Procedure: COMPUTER-ASSISTED NAVIGATION, FOR CRANIAL PROCEDURE;  Surgeon: Rosslyn Dino HERO, MD;  Location: [facility] OR;  Service: Neurosurgery;  Laterality: Right;  CRANIAL NAVIGATION   LEG SURGERY Bilateral as a child   STERIOTACTIC STIMULATOR INSERTION Right 07/30/2024   Procedure: RIGHT STEREOTACTIC BIOPSY;  Surgeon: Rosslyn Dino HERO, MD;  Location: [facility] OR;  Service: Neurosurgery;  Laterality: Right;  RIGHT STERIOTACTIC BRAIN BIOPSY   XI ROBOTIC ASSISTED INGUINAL HERNIA REPAIR WITH MESH Right 02/19/2020   Procedure: XI ROBOTIC ASSISTED Right INGUINAL HERNIA REPAIR WITH MESH;  Surgeon: Jordis Laneta FALCON, MD;  Location: [facility] ORS;  Service: General;  Laterality: Right;    Social History   Socioeconomic History   Marital status: Married    Spouse name: Not on file   Number of children: 1   Years of education: Not on file   Highest education level: Not on file  Occupational History   Not on file  Tobacco Use   Smoking status: Former    Current packs/day: 0.25    Average packs/day: 0.2 packs/day for 47.9 years (12.0 ttl pk-yrs)    Types: Cigarettes    Start date: 41   Smokeless tobacco: Never  Vaping Use   Vaping status: Never Used  Substance and Sexual Activity   Alcohol use: Not Currently    Alcohol/week: 10.0 standard drinks of alcohol    Types: 10 Cans of beer per week   Drug use: Not Currently   Sexual activity: Yes    Partners: Female  Other Topics Concern   Not on file  Social History Narrative   Not on file   Social Drivers of Health   Financial Resource Strain: Medium Risk (05/07/2024)   Received from [facility]   Overall Financial Resource Strain (CARDIA)    Difficulty of Paying Living Expenses: Somewhat hard  Food Insecurity: Food Insecurity Present (08/24/2024)   Hunger Vital Sign     Worried About Running Out of Food in the Last Year: Never true    Ran Out of Food in the Last Year: Sometimes true  Transportation Needs: No Transportation Needs (08/24/2024)   PRAPARE - Administrator, Civil Service (Medical): No    Lack of Transportation (Non-Medical): No  Physical Activity: Not on file  Stress: Not on file  Social Connections: Unknown (07/25/2024)   Social Connection and Isolation Panel    Frequency of Communication with Friends and Family: Patient declined    Frequency of Social Gatherings with Friends and Family: Patient declined    Attends Religious Services: Patient declined    Database Administrator or Organizations: Patient declined    Attends Banker Meetings: Patient declined    Marital Status: Living with partner  Intimate Partner Violence: Not At Risk (08/24/2024)   Humiliation, Afraid, Rape, and Kick questionnaire    Fear of  Current or Ex-Partner: No    Emotionally Abused: No    Physically Abused: No    Sexually Abused: No    Family History  Problem Relation Age of Onset   Dementia Mother    Dementia Father    Dementia Maternal Aunt      Current Outpatient Medications:    nitrofurantoin , macrocrystal-monohydrate, (MACROBID ) 100 MG capsule, Take 1 capsule (100 mg total) by mouth 2 (two) times daily for 7 days., Disp: 14 capsule, Rfl: 0   acetaminophen  (TYLENOL ) 325 MG tablet, Take 2 tablets (650 mg total) by mouth every 6 (six) hours as needed for mild pain (pain score 1-3) or fever (or Fever >/= 101)., Disp: , Rfl:    butalbital -acetaminophen -caffeine  (FIORICET ) 50-325-40 MG tablet, Take 2 tablets by mouth every 4 (four) hours as needed for headache (moderate pain)., Disp: 14 tablet, Rfl: 0   dexamethasone  (DECADRON ) 2 MG tablet, Take 1 tablet (2 mg total) by mouth daily., Disp: , Rfl:    magnesium  gluconate (MAGONATE) 500 (27 Mg) MG TABS tablet, Take 0.5 tablets (250 mg total) by mouth daily., Disp: 30 tablet, Rfl: 0    ondansetron  (ZOFRAN ) 8 MG tablet, Take 1 tablet (8 mg total) by mouth every 8 (eight) hours as needed for nausea or vomiting. May take 30-60 minutes prior to Temodar  administration if nausea/vomiting occurs as needed., Disp: 30 tablet, Rfl: 1   pantoprazole  (PROTONIX ) 40 MG tablet, Take 1 tablet (40 mg total) by mouth daily., Disp: 30 tablet, Rfl: 0   polyethylene glycol powder (GLYCOLAX /MIRALAX ) 17 GM/SCOOP powder, Dissolve 1 capful (17g) in 4-8 ounces of liquid and take by mouth daily as needed for moderate constipation., Disp: 238 g, Rfl: 0   senna-docusate (SENOKOT-S) 8.6-50 MG tablet, Take 1 tablet by mouth at bedtime as needed for mild constipation., Disp: 30 tablet, Rfl: 0   traZODone  (DESYREL ) 50 MG tablet, Take 1 tablet (50 mg total) by mouth at bedtime as needed for sleep., Disp: 30 tablet, Rfl: 0  PHYSICAL EXAM ECOG FS:3 - Symptomatic, >50% confined to bed    Vitals:   10/10/24 1046  BP: 104/77  Pulse: 92  Resp: 20  Temp: 98.9 F (37.2 C)  TempSrc: Oral  SpO2: 98%   Physical Exam Vitals and nursing note reviewed.  Constitutional:      Appearance: He is not ill-appearing or toxic-appearing.  HENT:     Head: Normocephalic.  Eyes:     Conjunctiva/sclera: Conjunctivae normal.  Cardiovascular:     Rate and Rhythm: Normal rate and regular rhythm.     Pulses: Normal pulses.     Heart sounds: Normal heart sounds.  Pulmonary:     Effort: Pulmonary effort is normal.     Breath sounds: Normal breath sounds.  Abdominal:     General: There is no distension.     Tenderness: There is no abdominal tenderness. There is no right CVA tenderness, left CVA tenderness or guarding.  Musculoskeletal:     Cervical back: Normal range of motion.  Skin:    General: Skin is warm and dry.  Neurological:     Mental Status: He is alert.        LABORATORY DATA I have reviewed the data as listed    Latest Ref Rng & Units 10/10/2024   12:47 PM 10/02/2024    3:52 PM 09/24/2024    3:44 PM   CBC  WBC 4.0 - 10.5 K/uL 4.0  4.5  7.0   Hemoglobin 13.0 - 17.0 g/dL  15.8  16.3  16.4   Hematocrit 39.0 - 52.0 % 45.9  47.0  47.7   Platelets 150 - 400 K/uL 27  92  259         Latest Ref Rng & Units 10/10/2024   12:47 PM 10/02/2024    3:52 PM 09/24/2024    3:44 PM  CMP  Glucose 70 - 99 mg/dL 83  83  899   BUN 8 - 23 mg/dL 13  13  16    Creatinine 0.61 - 1.24 mg/dL 9.24  9.10  9.07   Sodium 135 - 145 mmol/L 137  136  136   Potassium 3.5 - 5.1 mmol/L 4.5  4.0  5.0   Chloride 98 - 111 mmol/L 97  100  101   CO2 22 - 32 mmol/L 30  30  30    Calcium 8.9 - 10.3 mg/dL 9.9  9.5  9.8   Total Protein 6.5 - 8.1 g/dL 7.5  7.0  7.4   Total Bilirubin 0.0 - 1.2 mg/dL 0.5  0.6  0.4   Alkaline Phos 38 - 126 U/L 77  60  67   AST 15 - 41 U/L 16  13  16    ALT 0 - 44 U/L 19  16  23         RADIOGRAPHIC STUDIES (from last 24 hours if applicable) I have personally reviewed the radiological images as listed and agreed with the findings in the report. No results found.      Visit Diagnosis: 1. Glioblastoma (HCC)   2. Urinary tract infection with hematuria, site unspecified   3. Thrombocytopenia      No orders of the defined types were placed in this encounter.   All questions were answered. The patient knows to call the clinic with any problems, questions or concerns. No barriers to learning was detected.  A total of more than 30 minutes were spent on this encounter with face-to-face time and non-face-to-face time, including preparing to see the patient, ordering tests and/or medications, counseling the patient and coordination of care as outlined above.    Thank you for allowing me to participate in the care of this patient.    Albaraa Swingle E  Walisiewicz, PA-C Department of Hematology/Oncology [facility] at [facility] Phone: [phone]  [phone]    10/10/2024 1:50 PM

## 2024-10-11 ENCOUNTER — Other Ambulatory Visit (HOSPITAL_COMMUNITY): Payer: Self-pay

## 2024-10-11 ENCOUNTER — Ambulatory Visit
Admission: RE | Admit: 2024-10-11 | Discharge: 2024-10-11 | Disposition: A | Source: Ambulatory Visit | Attending: Radiation Oncology

## 2024-10-11 ENCOUNTER — Other Ambulatory Visit: Payer: Self-pay

## 2024-10-11 LAB — RAD ONC ARIA SESSION SUMMARY
Course Elapsed Days: 35
Plan Fractions Treated to Date: 2
Plan Prescribed Dose Per Fraction: 2 Gy
Plan Total Fractions Prescribed: 7
Plan Total Prescribed Dose: 14 Gy
Reference Point Dosage Given to Date: 4 Gy
Reference Point Session Dosage Given: 2 Gy
Session Number: 25

## 2024-10-12 ENCOUNTER — Other Ambulatory Visit: Payer: Self-pay

## 2024-10-12 ENCOUNTER — Other Ambulatory Visit: Payer: Self-pay | Admitting: Physician Assistant

## 2024-10-12 ENCOUNTER — Ambulatory Visit
Admission: RE | Admit: 2024-10-12 | Discharge: 2024-10-12 | Disposition: A | Discharge status: Home / Self Care | Source: Ambulatory Visit | Attending: Radiation Oncology | Admitting: Radiation Oncology

## 2024-10-12 LAB — RAD ONC ARIA SESSION SUMMARY
Course Elapsed Days: 36
Plan Fractions Treated to Date: 3
Plan Prescribed Dose Per Fraction: 2 Gy
Plan Total Fractions Prescribed: 7
Plan Total Prescribed Dose: 14 Gy
Reference Point Dosage Given to Date: 6 Gy
Reference Point Session Dosage Given: 2 Gy
Session Number: 26

## 2024-10-12 LAB — URINE CULTURE: Culture: >=100000 — AB

## 2024-10-12 MED ORDER — CIPROFLOXACIN HCL 500 MG PO TABS: 500.0000 mg | ORAL_TABLET | Freq: Two times a day (BID) | ORAL | 0 refills | Status: AC

## 2024-10-12 NOTE — Progress Notes (Signed)
 Reviewed urine culture from 10/10/24. Patient was prescribed nitrofurantoin  however susceptibilities report shows resistance to that. New prescription sent in for ciprofloxacin .   Attempted to call patient and significant other without success x 3. Patient is scheduled for radiation today. Will ask radiation staff to relay message about new antibiotic/attempt to call significant other again this afternoon.

## 2024-10-15 ENCOUNTER — Ambulatory Visit
Admission: RE | Admit: 2024-10-15 | Discharge: 2024-10-15 | Disposition: A | Source: Ambulatory Visit | Attending: Radiation Oncology

## 2024-10-15 ENCOUNTER — Ambulatory Visit
Admission: RE | Admit: 2024-10-15 | Discharge: 2024-10-15 | Disposition: A | Discharge status: Home / Self Care | Source: Ambulatory Visit | Attending: Radiation Oncology | Admitting: Radiation Oncology

## 2024-10-15 ENCOUNTER — Other Ambulatory Visit: Payer: Self-pay

## 2024-10-15 LAB — RAD ONC ARIA SESSION SUMMARY
Course Elapsed Days: 39
Plan Fractions Treated to Date: 4
Plan Prescribed Dose Per Fraction: 2 Gy
Plan Total Fractions Prescribed: 7
Plan Total Prescribed Dose: 14 Gy
Reference Point Dosage Given to Date: 8 Gy
Reference Point Session Dosage Given: 2 Gy
Session Number: 27

## 2024-10-16 ENCOUNTER — Inpatient Hospital Stay

## 2024-10-16 ENCOUNTER — Encounter: Payer: Self-pay | Admitting: Radiology

## 2024-10-16 ENCOUNTER — Other Ambulatory Visit: Payer: Self-pay

## 2024-10-16 ENCOUNTER — Ambulatory Visit
Admission: RE | Admit: 2024-10-16 | Discharge: 2024-10-16 | Disposition: A | Discharge status: Home / Self Care | Source: Ambulatory Visit | Attending: Radiation Oncology | Admitting: Radiation Oncology

## 2024-10-16 ENCOUNTER — Inpatient Hospital Stay: Admitting: Internal Medicine

## 2024-10-16 VITALS — BP 103/88 | HR 102 | Temp 98.5°F | Resp 18

## 2024-10-16 DIAGNOSIS — C719 Malignant neoplasm of brain, unspecified: Secondary | ICD-10-CM

## 2024-10-16 LAB — RAD ONC ARIA SESSION SUMMARY
Course Elapsed Days: 40
Plan Fractions Treated to Date: 5
Plan Prescribed Dose Per Fraction: 2 Gy
Plan Total Fractions Prescribed: 7
Plan Total Prescribed Dose: 14 Gy
Reference Point Dosage Given to Date: 10 Gy
Reference Point Session Dosage Given: 2 Gy
Session Number: 28

## 2024-10-16 LAB — CMP (CANCER CENTER ONLY)
ALT: 20 U/L (ref 0–44)
AST: 19 U/L (ref 15–41)
Albumin: 3.7 g/dL (ref 3.5–5.0)
Alkaline Phosphatase: 78 U/L (ref 38–126)
Anion gap: 11 (ref 5–15)
BUN: 11 mg/dL (ref 8–23)
CO2: 25 mmol/L (ref 22–32)
Calcium: 9.8 mg/dL (ref 8.9–10.3)
Chloride: 99 mmol/L (ref 98–111)
Creatinine: 0.57 mg/dL — ABNORMAL LOW (ref 0.61–1.24)
GFR, Estimated: >60 mL/min (ref >60)
Glucose, Bld: 100 mg/dL — ABNORMAL HIGH (ref 70–99)
Potassium: 4 mmol/L (ref 3.5–5.1)
Sodium: 135 mmol/L (ref 135–145)
Total Bilirubin: 0.4 mg/dL (ref 0.0–1.2)
Total Protein: 7.7 g/dL (ref 6.5–8.1)

## 2024-10-16 LAB — CBC WITH DIFFERENTIAL (CANCER CENTER ONLY)
Abs Immature Granulocytes: 0.01 K/uL (ref 0.00–0.07)
Basophils Absolute: 0 K/uL (ref 0.0–0.1)
Basophils Relative: 0 %
Eosinophils Absolute: 0.1 K/uL (ref 0.0–0.5)
Eosinophils Relative: 3 %
HCT: 41.5 % (ref 39.0–52.0)
Hemoglobin: 14.6 g/dL (ref 13.0–17.0)
Immature Granulocytes: 0 %
Lymphocytes Relative: 19 %
Lymphs Abs: 0.6 K/uL — ABNORMAL LOW (ref 0.7–4.0)
MCH: 31.3 pg (ref 26.0–34.0)
MCHC: 35.2 g/dL (ref 30.0–36.0)
MCV: 88.9 fL (ref 80.0–100.0)
Monocytes Absolute: 0.3 K/uL (ref 0.1–1.0)
Monocytes Relative: 8 %
Neutro Abs: 2.1 K/uL (ref 1.7–7.7)
Neutrophils Relative %: 70 %
Platelet Count: 58 K/uL — ABNORMAL LOW (ref 150–400)
RBC: 4.67 MIL/uL (ref 4.22–5.81)
RDW: 13.4 % (ref 11.5–15.5)
WBC Count: 3 K/uL — ABNORMAL LOW (ref 4.0–10.5)
nRBC: 0 % (ref 0.0–0.2)

## 2024-10-16 NOTE — Progress Notes (Signed)
 [facility] [address]  [place], [place] [address] [phone]   Interval Evaluation  Date of Service: 10/16/24 Patient Name: Joshua Harmon Patient MRN: [id number] Patient DOB: [birth year] Provider: Arthea MARLA Manns, MD  Identifying Statement:  Joshua Harmon is a 63 y.o. male with right frontal glioblastoma who presents for initial consultation and evaluation.    Referring Provider: Everlene Parris LABOR, MD [address] [place],  [place] [address]  Oncologic History 07/30/24: Stereotactic biopsy with Dr. Rosslyn; path is grade 4 glioma IDH NOS  Biomarkers: Insufficient tissue from biopsy for genetic markers or NGS  Interval History: Joshua Harmon presents today for follow up, now in final week of radiation and Temodar .  He is currently dosing ciprofloxacin  for UTI, started last week. No further clots in his urine.  He is no longer dosing the Temodar  because of the low platelets.  Otherwise no reported neurologic changes today.  He remains in a a wheelchair or bedbound due to left sided weakness.  Decadron  is currently at 2mg  daily.  He continues to work with PT on his left side.    H+P (08/21/24) Patient presented to neurologic attention this month with complaint of progressive left sided weakness.  His leg was affected more than the arm, limiting his ability to walk on his own.  CNS imaging demonstrated enhancing mass within right frontal lobe consistent with primary brain tumor.  He underwent biopsy on 07/30/24 with Dr. Rosslyn; path demonstrated high grade glioma.  In the interim he has been in rehab strengthening his left side.  He is doing some walking with a walker, and his left arm strength is good now.  Dosing decadron  2mg  three times per day.  No seizures or headaches.   Medications: Current Outpatient Medications on File Prior to Visit  Medication Sig Dispense Refill   acetaminophen  (TYLENOL ) 325 MG tablet Take 2 tablets (650 mg total)  by mouth every 6 (six) hours as needed for mild pain (pain score 1-3) or fever (or Fever >/= 101).     butalbital -acetaminophen -caffeine  (FIORICET ) 50-325-40 MG tablet Take 2 tablets by mouth every 4 (four) hours as needed for headache (moderate pain). 14 tablet 0   ciprofloxacin  (CIPRO ) 500 MG tablet Take 1 tablet (500 mg total) by mouth 2 (two) times daily for 5 days. 10 tablet 0   dexamethasone  (DECADRON ) 2 MG tablet Take 1 tablet (2 mg total) by mouth daily.     magnesium  gluconate (MAGONATE) 500 (27 Mg) MG TABS tablet Take 0.5 tablets (250 mg total) by mouth daily. 30 tablet 0   ondansetron  (ZOFRAN ) 8 MG tablet Take 1 tablet (8 mg total) by mouth every 8 (eight) hours as needed for nausea or vomiting. May take 30-60 minutes prior to Temodar  administration if nausea/vomiting occurs as needed. 30 tablet 1   pantoprazole  (PROTONIX ) 40 MG tablet Take 1 tablet (40 mg total) by mouth daily. 30 tablet 0   polyethylene glycol powder (GLYCOLAX /MIRALAX ) 17 GM/SCOOP powder Dissolve 1 capful (17g) in 4-8 ounces of liquid and take by mouth daily as needed for moderate constipation. 238 g 0   senna-docusate (SENOKOT-S) 8.6-50 MG tablet Take 1 tablet by mouth at bedtime as needed for mild constipation. 30 tablet 0   traZODone  (DESYREL ) 50 MG tablet Take 1 tablet (50 mg total) by mouth at bedtime as needed for sleep. 30 tablet 0   No current facility-administered medications on file prior to visit.  Allergies: No Known Allergies Past Medical History:  Past Medical History:  Diagnosis Date   GERD (gastroesophageal reflux disease)    Past Surgical History:  Past Surgical History:  Procedure Laterality Date   APPLICATION OF CRANIAL NAVIGATION Right 07/30/2024   Procedure: COMPUTER-ASSISTED NAVIGATION, FOR CRANIAL PROCEDURE;  Surgeon: Rosslyn Dino HERO, MD;  Location: [facility] OR;  Service: Neurosurgery;  Laterality: Right;  CRANIAL NAVIGATION   LEG SURGERY Bilateral as a child   STERIOTACTIC STIMULATOR  INSERTION Right 07/30/2024   Procedure: RIGHT STEREOTACTIC BIOPSY;  Surgeon: Rosslyn Dino HERO, MD;  Location: [facility] OR;  Service: Neurosurgery;  Laterality: Right;  RIGHT STERIOTACTIC BRAIN BIOPSY   XI ROBOTIC ASSISTED INGUINAL HERNIA REPAIR WITH MESH Right 02/19/2020   Procedure: XI ROBOTIC ASSISTED Right INGUINAL HERNIA REPAIR WITH MESH;  Surgeon: Jordis Laneta FALCON, MD;  Location: [facility] ORS;  Service: General;  Laterality: Right;   Social History:  Social History   Socioeconomic History   Marital status: Married    Spouse name: Not on file   Number of children: 1   Years of education: Not on file   Highest education level: Not on file  Occupational History   Not on file  Tobacco Use   Smoking status: Former    Current packs/day: 0.25    Average packs/day: 0.2 packs/day for 47.9 years (12.0 ttl pk-yrs)    Types: Cigarettes    Start date: 1978   Smokeless tobacco: Never  Vaping Use   Vaping status: Never Used  Substance and Sexual Activity   Alcohol use: Not Currently    Alcohol/week: 10.0 standard drinks of alcohol    Types: 10 Cans of beer per week   Drug use: Not Currently   Sexual activity: Yes    Partners: Female  Other Topics Concern   Not on file  Social History Narrative   Not on file   Social Drivers of Health   Financial Resource Strain: Medium Risk (05/07/2024)   Received from [facility]   Overall Financial Resource Strain (CARDIA)    Difficulty of Paying Living Expenses: Somewhat hard  Food Insecurity: Food Insecurity Present (08/24/2024)   Hunger Vital Sign    Worried About Running Out of Food in the Last Year: Never true    Ran Out of Food in the Last Year: Sometimes true  Transportation Needs: No Transportation Needs (08/24/2024)   PRAPARE - Administrator, Civil Service (Medical): No    Lack of Transportation (Non-Medical): No  Physical Activity: Not on file  Stress: Not on file  Social Connections: Unknown (07/25/2024)   Social  Connection and Isolation Panel    Frequency of Communication with Friends and Family: Patient declined    Frequency of Social Gatherings with Friends and Family: Patient declined    Attends Religious Services: Patient declined    Database Administrator or Organizations: Patient declined    Attends Banker Meetings: Patient declined    Marital Status: Living with partner  Intimate Partner Violence: Not At Risk (08/24/2024)   Humiliation, Afraid, Rape, and Kick questionnaire    Fear of Current or Ex-Partner: No    Emotionally Abused: No    Physically Abused: No    Sexually Abused: No   Family History:  Family History  Problem Relation Age of Onset   Dementia Mother    Dementia Father    Dementia Maternal Aunt     Review of Systems: Constitutional: Doesn't report fevers, chills or abnormal  weight loss Eyes: Doesn't report blurriness of vision Ears, nose, mouth, throat, and face: Doesn't report sore throat Respiratory: Doesn't report cough, dyspnea or wheezes Cardiovascular: Doesn't report palpitation, chest discomfort  Gastrointestinal:  Doesn't report nausea, constipation, diarrhea GU: Doesn't report incontinence Skin: Doesn't report skin rashes Neurological: Per HPI Musculoskeletal: Doesn't report joint pain Behavioral/Psych: Doesn't report anxiety  Physical Exam: Wt Readings from Last 3 Encounters:  10/02/24 149 lb (67.6 kg)  08/31/24 143 lb (64.9 kg)  08/14/24 153 lb 3.5 oz (69.5 kg)   Temp Readings from Last 3 Encounters:  10/10/24 98.9 F (37.2 C) (Oral)  10/02/24 98 F (36.7 C) (Temporal)  08/31/24 98.2 F (36.8 C) (Oral)   BP Readings from Last 3 Encounters:  10/10/24 104/77  10/02/24 98/78  08/31/24 121/86   Pulse Readings from Last 3 Encounters:  10/10/24 92  10/02/24 88  08/31/24 78    KPS: 60. General: Alert, cooperative, pleasant, in no acute distress Head: Normal EENT: No conjunctival injection or scleral icterus.  Lungs: Resp  effort normal Cardiac: Regular rate Abdomen: Non-distended abdomen Skin: No rashes cyanosis or petechiae. Extremities: No clubbing or edema  Neurologic Exam: Mental Status: Awake, alert, attentive to examiner. Oriented to self and environment. Language is fluent with intact comprehension.  Cranial Nerves: Visual acuity is grossly normal. Visual fields are full. Extra-ocular movements intact. No ptosis. Face is symmetric Motor: Tone and bulk are normal. Power is 3/5 in left leg, 3/5 in left arm. Reflexes are symmetric, no pathologic reflexes present.  Sensory: Intact to light touch Gait: Non ambulatory   Labs: I have reviewed the data as listed    Component Value Date/Time   NA 137 10/10/2024 1247   K 4.5 10/10/2024 1247   CL 97 (L) 10/10/2024 1247   CO2 30 10/10/2024 1247   GLUCOSE 83 10/10/2024 1247   BUN 13 10/10/2024 1247   CREATININE 0.75 10/10/2024 1247   CALCIUM 9.9 10/10/2024 1247   PROT 7.5 10/10/2024 1247   ALBUMIN 3.9 10/10/2024 1247   AST 16 10/10/2024 1247   ALT 19 10/10/2024 1247   ALKPHOS 77 10/10/2024 1247   BILITOT 0.5 10/10/2024 1247   GFRNONAA >60 10/10/2024 1247   GFRAA >60 01/29/2020 1418   Lab Results  Component Value Date   WBC 4.0 10/10/2024   NEUTROABS 3.0 10/10/2024   HGB 15.8 10/10/2024   HCT 45.9 10/10/2024   MCV 89.8 10/10/2024   PLT 27 (L) 10/10/2024      Assessment/Plan Glioblastoma (HCC)  Joshua Harmon is clinically stable today, now in final week of radiation and Temozolomide .  Labs demonstrate some improvement in thrombocytopenia, up to 58k from 27k last week.  We ultimately recommended continuing with course of intensity modulated radiation therapy and concurrent daily Temozolomide .  Radiation will be administered Mon-Fri over 6 weeks, Temodar  will be dosed at 75mg /m2 to be given daily over 42 days.  We reviewed side effects of temodar , including fatigue, nausea/vomiting, constipation, and cytopenias.  Temozolomide  will  continue to be held for final treatments due to ongoing thrombocytopenia.    Chemotherapy should be held for the following:  ANC less than 1,000  Platelets less than 100,000  LFT or creatinine greater than 2x ULN  If clinical concerns/contraindications develop  He will finish out the week of ciprofloxacin  as scheduled.   Decadron  may continue 2mg  daily as this has helped with appetite; he has experienced some weight loss in the past month.  Will remain off Ritalin , Baclofen .  We ask that Joshua Harmon return to clinic in 1 months following post-RT brain MRI, or sooner as needed.  All questions were answered. The patient knows to call the clinic with any problems, questions or concerns. No barriers to learning were detected.  The total time spent in the encounter was 30 minutes and more than 50% was on counseling and review of test results   Arthea MARLA Manns, MD Medical Director of Neuro-Oncology [facility] at [facility] 10/16/24 3:39 PM

## 2024-10-16 NOTE — Progress Notes (Addendum)
 Joshua Harmon received his 23 of 30 radiation treatments today. He complains of an approximate 1-week history of 8/10 neck and right shoulder pain. Patient was writhing in pain on physical exam today. Pain is worse when lying flat and on his right shoulder. He denies known trauma however his wife states his pain may have started after being transported (via EMS) to and from his cancer treatments. MRI of the C-spine on 07/25/2024 demonstrated demonstrates significant stenosis, disc generation, and facet ankylosis. He is currently taking Decadron  2 mg, but is not taking other medication for pain.   If patient continues to experience pain prior to his radiation we can give him pain medication in office to help tolerate treatment. I encouraged him to advocate for more comfortable positioning at his nursing home. I will reach out to Dr. Janjua to determine if it is reasonable to further evaluate his MRI findings as a cause of this pain.     Leeroy Due, PA-C

## 2024-10-17 ENCOUNTER — Other Ambulatory Visit: Payer: Self-pay

## 2024-10-17 ENCOUNTER — Ambulatory Visit

## 2024-10-17 ENCOUNTER — Ambulatory Visit
Admission: RE | Admit: 2024-10-17 | Discharge: 2024-10-17 | Disposition: A | Source: Ambulatory Visit | Attending: Radiation Oncology

## 2024-10-17 LAB — RAD ONC ARIA SESSION SUMMARY
Course Elapsed Days: 41
Plan Fractions Treated to Date: 6
Plan Prescribed Dose Per Fraction: 2 Gy
Plan Total Fractions Prescribed: 7
Plan Total Prescribed Dose: 14 Gy
Reference Point Dosage Given to Date: 12 Gy
Reference Point Session Dosage Given: 2 Gy
Session Number: 29

## 2024-10-18 ENCOUNTER — Ambulatory Visit

## 2024-10-18 ENCOUNTER — Other Ambulatory Visit: Payer: Self-pay

## 2024-10-22 ENCOUNTER — Other Ambulatory Visit: Payer: Self-pay

## 2024-10-22 ENCOUNTER — Ambulatory Visit
Admission: RE | Admit: 2024-10-22 | Discharge: 2024-10-22 | Disposition: A | Source: Ambulatory Visit | Attending: Radiation Oncology

## 2024-10-22 DIAGNOSIS — R319 Hematuria, unspecified: Secondary | ICD-10-CM | POA: Diagnosis not present

## 2024-10-22 DIAGNOSIS — C711 Malignant neoplasm of frontal lobe: Secondary | ICD-10-CM | POA: Diagnosis present

## 2024-10-22 DIAGNOSIS — Z510 Encounter for antineoplastic radiation therapy: Secondary | ICD-10-CM | POA: Insufficient documentation

## 2024-10-22 DIAGNOSIS — N390 Urinary tract infection, site not specified: Secondary | ICD-10-CM | POA: Diagnosis not present

## 2024-10-22 LAB — RAD ONC ARIA SESSION SUMMARY
Course Elapsed Days: 46
Plan Fractions Treated to Date: 7
Plan Prescribed Dose Per Fraction: 2 Gy
Plan Total Fractions Prescribed: 7
Plan Total Prescribed Dose: 14 Gy
Reference Point Dosage Given to Date: 14 Gy
Reference Point Session Dosage Given: 2 Gy
Session Number: 30

## 2024-10-23 ENCOUNTER — Other Ambulatory Visit: Payer: Self-pay

## 2024-10-23 NOTE — Radiation Completion Notes (Signed)
 Patient Name: Joshua Harmon, KUBISIAK MRN: [id number] Date of Birth: [birth year] Referring Physician: CLARISSA ZAFIROV, M.D. Date of Service: 2024-10-23 Radiation Oncologist: Lauraine Golden, M.D. 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 Intent: Palliative     ==========DELIVERED PLANS==========  First Treatment Date: 2024-09-06 Last Treatment Date: 2024-10-22   Plan Name: Brain_R_front Site: Frontal Lobe Technique: IMRT Mode: Photon Dose Per Fraction: 2 Gy Prescribed Dose (Delivered / Prescribed): 46 Gy / 46 Gy Prescribed Fxs (Delivered / Prescribed): 23 / 23   Plan Name: Brain_Bst_R Site: Frontal Lobe Technique: IMRT Mode: Photon Dose Per Fraction: 2 Gy Prescribed Dose (Delivered / Prescribed): 14 Gy / 14 Gy Prescribed Fxs (Delivered / Prescribed): 7 / 7     ==========ON TREATMENT VISIT DATES========== 2024-09-10, 2024-09-17, 2024-09-24, 2024-10-01, 2024-10-08, 2024-10-15, 2024-10-22     ==========UPCOMING VISITS========== 11/19/2024 [facility] EST PT [address] Arthea POUR, MD  11/19/2024 [facility]        ==========APPENDIX - ON TREATMENT VISIT NOTES==========   See weekly On Treatment Notes in Epic for details in the Media tab (listed as Progress notes on the On Treatment Visit Dates listed above).

## 2024-11-07 ENCOUNTER — Encounter: Payer: Self-pay | Admitting: Internal Medicine

## 2024-11-13 ENCOUNTER — Encounter: Payer: Self-pay | Admitting: Internal Medicine

## 2024-11-16 ENCOUNTER — Ambulatory Visit (HOSPITAL_COMMUNITY): Attending: Internal Medicine

## 2024-11-19 ENCOUNTER — Inpatient Hospital Stay

## 2024-11-19 ENCOUNTER — Telehealth: Payer: Self-pay | Admitting: Internal Medicine

## 2024-11-19 ENCOUNTER — Encounter: Payer: Self-pay | Admitting: Internal Medicine

## 2024-11-19 ENCOUNTER — Inpatient Hospital Stay: Admitting: Internal Medicine

## 2024-11-19 NOTE — Telephone Encounter (Signed)
 Tried to call the patient and let him know I am canceling his appointments today since he does not have an MRI scheduled. Could not reach him.

## 2024-11-19 NOTE — Telephone Encounter (Signed)
 Left a voicemail with the patients wife letting her know that I am canceling the appointments today because there is no Mri scheduled. I gave the number for the Radiology department so they can call and get that set up and then to call us  and reschedule the labs and follow-up.

## 2024-11-20 ENCOUNTER — Inpatient Hospital Stay: Admitting: Internal Medicine

## 2024-11-20 ENCOUNTER — Other Ambulatory Visit: Payer: Self-pay

## 2024-11-23 ENCOUNTER — Ambulatory Visit (HOSPITAL_COMMUNITY)
Admission: RE | Admit: 2024-11-23 | Discharge: 2024-11-23 | Disposition: A | Discharge status: Home / Self Care | Source: Ambulatory Visit | Attending: Internal Medicine | Admitting: Internal Medicine

## 2024-11-23 DIAGNOSIS — C719 Malignant neoplasm of brain, unspecified: Secondary | ICD-10-CM | POA: Diagnosis present

## 2024-11-23 MED ORDER — GADOBUTROL 1 MMOL/ML IV SOLN
6.0000 mL | Freq: Once | INTRAVENOUS | Status: AC | PRN
  Administered 2024-11-23: 6 mL via INTRAVENOUS

## 2024-11-27 ENCOUNTER — Inpatient Hospital Stay: Attending: Internal Medicine | Admitting: Internal Medicine

## 2024-11-27 DIAGNOSIS — C719 Malignant neoplasm of brain, unspecified: Secondary | ICD-10-CM | POA: Diagnosis not present

## 2024-11-27 MED ORDER — DEXAMETHASONE 4 MG PO TABS: 4.0000 mg | ORAL_TABLET | Freq: Two times a day (BID) | ORAL | 0 refills | Status: DC

## 2024-11-27 NOTE — Progress Notes (Signed)
 I connected with Joshua Harmon on 11/27/2024 at  9:45 AM EST by telephone visit and verified that I am speaking with the correct person using two identifiers.  I discussed the limitations, risks, security and privacy concerns of performing an evaluation and management service by telemedicine and the availability of in-person appointments. I also discussed with the patient that there may be a patient responsible charge related to this service. The patient expressed understanding and agreed to proceed.  Other persons participating in the visit and their role in the encounter:  spouse   Patient's location:  Home Provider's location:  Office Chief Complaint:  Glioblastoma Joshua Harmon) - Plan: Ambulatory referral to Home Health  History of Present Ilness: Joshua Harmon and his wife reports ongoing decline in left sided weakness over the past several weeks.  He is more contracted, not using the arm much, and is unable to stand due to the weakness.  Doesn't currently have any physical therapy in place.  Decadron  remains at 2mg  daily, same dose since the end of radiation treatments.    Observations: Language and cognition at baseline  Imaging:  [facility] Clinician Interpretation: I have personally reviewed the CNS images as listed.  My interpretation, in the context of the patient's clinical presentation, is treatment effect vs true progression  MR BRAIN W WO CONTRAST Result Date: 11/23/2024 EXAM: MRI BRAIN WITH AND WITHOUT CONTRAST 11/23/2024 01:44:04 PM TECHNIQUE: Multiplanar multisequence MRI of the head/brain was performed with and without the administration of 6 mL gadobutrol  (GADAVIST ) 1 MMOL/ML injection intravenous contrast. COMPARISON: MR head without and with contrast 07/25/2024. CLINICAL HISTORY: Brain/CNS neoplasm, assess treatment response. Glioblastoma. Status post radiation and Temodar . FINDINGS: LIMITATIONS/ARTIFACTS: The post-contrast images are severely degraded by patient motion. The patient  refused additional imaging. BRAIN AND VENTRICLES: No acute infarct. No acute intracranial hemorrhage. Heterogeneously enhancing mass lesion in the medial posterior frontal and parietal lobe has increased in size, now measuring 4 x 3.7 cm on axial images compared with 3.6 x 2.4 cm previously. Surrounding vasogenic edema has significantly increased. Midline shift is now present, measuring 6 mm. Progressive effacement of the right lateral ventricle is noted. White matter changes extend from the right temporal lobe to the anterior right frontal lobe. No contralateral tumor or enhancement is present. Mild generalized atrophy and white matter changes are otherwise stable. Progressive Wallerian degeneration is noted in the right side of the brainstem. Normal flow voids. The sella is unremarkable. ORBITS: A remote medial orbital wall fracture is present on the right. SINUSES: Small mastoid effusions are present. No obstructing nasopharyngeal lesion is present. Mild mucosal thickening is present in the inferior right maxillary sinus. BONES AND SOFT TISSUES: Normal bone marrow signal and enhancement. No acute soft tissue abnormality. IMPRESSION: 1. Progression of heterogeneously enhancing mass lesion in the medial posterior frontal and parietal lobe, now measuring 4 x 3.7 cm (previously 3.6 x 2.4 cm), with associated vasogenic edema, 6 mm midline shift, and progressive effacement of the right lateral ventricle. 2. No contralateral tumor or enhancement. 3. Severely degraded post-contrast images due to patient motion. Electronically signed by: Lonni Necessary MD 11/23/2024 02:11 PM EST RP Workstation: [id number]    Assessment and Plan: Glioblastoma Joshua Harmon) - Plan: Ambulatory referral to Home Health  Joshua Harmon is clinically progressive today, with reported worsening left sided function.  This is consistent with MRI findings, which are suggestive of either radiation treatment effect or organic progression of disease.     Recommended short course of decadron  4mg   BID x7 days, then re-assessment.  If motor function improves, can continue steroids and titrate dose.  If not, improved, he might benefit from avastin.  This would require transportation to the clinic, which is quite difficult right now for Joshua Harmon.    We will arrange for home nursing, PT and OT to help improve function and strength at home, in addition to the steroids.    Will also have social work reach out again to continue to work on solutions for homecare and transportation.  Follow Up Instructions: Will call in 1 week for re-assessment.  We are hopeful to be able to see him in person soon for lab check, MRI review, treatment planning discussion.  I discussed the assessment and treatment plan with the patient.  The patient was provided an opportunity to ask questions and all were answered.  The patient agreed with the plan and demonstrated understanding of the instructions.    The patient was advised to call back or seek an in-person evaluation if the symptoms worsen or if the condition fails to improve as anticipated.    Liisa Picone K Stasia Somero, MD   I provided 20 minutes of non face-to-face telephone visit time during this encounter, and > 50% was spent counseling as documented under my assessment & plan.

## 2024-11-28 ENCOUNTER — Telehealth: Payer: Self-pay | Admitting: Internal Medicine

## 2024-11-28 ENCOUNTER — Telehealth: Payer: Self-pay

## 2024-11-28 NOTE — Telephone Encounter (Signed)
 Spoke with [facility] to request services for patient. Faxed request per directions from [facility]. TC to pt to update yet his voice mailbox is full.  TC to [facility]. [name].  Left message that [facility] has been contacted and if they can accept him as a client at this time , they will phone him. Advised to clear out mail box.  [facility] staff reported they  will contact our office tomorrow if they decline the case.

## 2024-11-28 NOTE — Telephone Encounter (Signed)
 Scheduled patient for phone visit in one week. Called and left a voicemail with the day and time. Also mailing appointment reminder.

## 2024-11-29 ENCOUNTER — Inpatient Hospital Stay: Admitting: Licensed Clinical Social Worker

## 2024-11-29 ENCOUNTER — Other Ambulatory Visit: Payer: Self-pay

## 2024-11-29 NOTE — Telephone Encounter (Signed)
 [facility] unfortunately could not accept referral due to staffing.  Contacted [facility] and they do not work with this insurance provider.  Contacted [facility] (Karen Little) and she stated they could accept this insurance.  Referral faxed to [phone]

## 2024-11-29 NOTE — Progress Notes (Signed)
 [facility] CSW Progress Note  Clinical Child Psychotherapist contacted pt's significant other, [name], to follow-up on transportation concerns.    Interventions: Per [name] pt is not able to utilize his stretcher transport benefit unless his appointment includes some form of treatment.  This information passed on to medical team as pt may need an infusion.  Pt's appts will primarily be video to accommodate this challenge.  Per [name] pt was denied Medicaid.  CSW encouraged [name] to reach out to DSS to see why pt was denied as it could be additional information needs to be submitted and pt will be eligible.  [name] also encouraged to re-apply for SNAP benefits on behalf of pt to qualify for the [employer].  Another referral was sent by CSW on behalf of pt to [employer] as [name] reports she never heard from them after the first referral.        Follow Up Plan:  Patient will contact CSW with any support or resource needs    Devere JONELLE Manna, LCSW Clinical Social Worker [facility]    Patient is participating in a Managed Medicaid Plan:  Yes

## 2024-11-30 ENCOUNTER — Encounter: Payer: Self-pay | Admitting: Internal Medicine

## 2024-12-03 ENCOUNTER — Telehealth: Payer: Self-pay | Admitting: *Deleted

## 2024-12-03 DIAGNOSIS — C719 Malignant neoplasm of brain, unspecified: Secondary | ICD-10-CM
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
## 2024-12-03 NOTE — Telephone Encounter (Signed)
 [facility] contact Darice Ly for restart of referral [phone]

## 2024-12-04 ENCOUNTER — Inpatient Hospital Stay: Admitting: Internal Medicine

## 2024-12-04 ENCOUNTER — Telehealth: Payer: Self-pay

## 2024-12-04 DIAGNOSIS — C719 Malignant neoplasm of brain, unspecified: Secondary | ICD-10-CM

## 2024-12-04 MED ORDER — DEXAMETHASONE 4 MG PO TABS: 4.0000 mg | ORAL_TABLET | Freq: Every day | ORAL | 0 refills | Status: AC

## 2024-12-04 NOTE — Progress Notes (Signed)
 I connected with Joshua Harmon on 12/04/2024 at 12:15 PM EST by telephone visit and verified that I am speaking with the correct person using two identifiers.  I discussed the limitations, risks, security and privacy concerns of performing an evaluation and management service by telemedicine and the availability of in-person appointments. I also discussed with the patient that there may be a patient responsible charge related to this service. The patient expressed understanding and agreed to proceed.  Other persons participating in the visit and their role in the encounter:  spouse   Patient's location:  Home Provider's location:  Office Chief Complaint:  Glioblastoma (HCC)  History of Present Ilness: Joshua Harmon and his signficant other report no meaningful improvement in left sided weakness since increasing the decadron  to 4mg  twice per day.  He remains contracted with minimal use of the arm, and is unable to stand due to the weakness.  His cognition, sharpness may be somewhat better.  No other new or progressive changes.    Observations: Language and cognition at baseline   Assessment and Plan: Glioblastoma (HCC)  Joshua Harmon is clinically refractory today to higher dose steroids, with ongoing impairments left sided function.    We discussed goals of care again today.  He would like to continue to trial other interventions, and is open to initiating avastin.  He also made clear that all healthcare information should be shared only with his significant other, Joshua Harmon.  He did not consent to sharing of healthcare information with other family members.  We did discuss and recommend proceeding with avastin 10mg /kg IV q2 weeks.  Avastin will be helpful in treatment of inflammation, as steroid sparing agent, and possibly as disease modifying drug.  We reviewed potential side effects, including hypertension, bleeding/clotting events, and wound healing impairment.  The patient will  have a complete blood count, a comprehensive metabolic panel, and urine protein performed prior to each avastin infusion. Labs may need to be performed more often.   Avastin should be held for the following:  ANC less than 500  Platelets less than 50,000  LFT or creatinine greater than 2x ULN  If clinical concerns/contraindications develop  Decadron  should decrease to 4mg  daily, if tolerated.  Will continue to have social work involved to help with for homecare and transportation issues.  Follow Up Instructions: Return to clinic ASAP for initiation of avastin therapy.  I discussed the assessment and treatment plan with the patient.  The patient was provided an opportunity to ask questions and all were answered.  The patient agreed with the plan and demonstrated understanding of the instructions.    The patient was advised to call back or seek an in-person evaluation if the symptoms worsen or if the condition fails to improve as anticipated.    Joshua Holness K Jama Krichbaum, MD   I provided 20 minutes of non face-to-face telephone visit time during this encounter, and > 50% was spent counseling as documented under my assessment & plan.

## 2024-12-04 NOTE — Progress Notes (Signed)
 DISCONTINUE ON PATHWAY REGIMEN - Neuro     One cycle, concurrent with RT:     Temozolomide    **Always confirm dose/schedule in your pharmacy ordering system**  PRIOR TREATMENT: [id number]: Radiation Therapy with Concurrent Temozolomide  75 mg/m2 Daily x 6 Weeks, Followed by Adjuvant Temozolomide   START ON PATHWAY REGIMEN - Neuro     A cycle is every 14 days:     Bevacizumab-xxxx   **Always confirm dose/schedule in your pharmacy ordering system**  Patient Characteristics: Glioma, Glioblastoma, IDH-wildtype, Recurrent or Progressive, Nonsurgical Candidate, Systemic Therapy Candidate, BRAF V600E Mutation Negative/Unknown and NTRK Fusion Negative/Unknown Disease Classification: Glioma Disease Classification: Glioblastoma, IDH-wildtype Disease Status: Recurrent or Progressive Treatment Classification: Nonsurgical Candidate Treatment (Nonsurgical/Adjuvant): Systemic Therapy Candidate BRAF V600E Mutation Status: Negative NTRK Gene Fusion Status: Negative Intent of Therapy: Non-Curative / Palliative Intent, Discussed with Patient

## 2024-12-07 ENCOUNTER — Other Ambulatory Visit: Payer: Self-pay

## 2024-12-11 ENCOUNTER — Inpatient Hospital Stay: Admitting: Internal Medicine

## 2024-12-11 ENCOUNTER — Inpatient Hospital Stay

## 2024-12-13 ENCOUNTER — Telehealth: Payer: Self-pay | Admitting: *Deleted

## 2024-12-13 NOTE — Telephone Encounter (Signed)
 PC to patient to verify appointments on 12/18/24 - no answer, voice mailbox is full.  Attempted to call patient's S/O Orie, her voice mailbox is also full.

## 2024-12-14 NOTE — Telephone Encounter (Signed)
 1/27 appts confirmed with Orie

## 2024-12-17 NOTE — Progress Notes (Signed)
 Pharmacist Chemotherapy Monitoring - Initial Assessment    Anticipated start date: 12/18/2024   The following has been reviewed per standard work regarding the patient's treatment regimen: The patient's diagnosis, treatment plan and drug doses, and organ/hematologic function Lab orders and baseline tests specific to treatment regimen  The treatment plan start date, drug sequencing, and pre-medications Prior authorization status  Patient's documented medication list, including drug-drug interaction screen and prescriptions for anti-emetics and supportive care specific to the treatment regimen The drug concentrations, fluid compatibility, administration routes, and timing of the medications to be used The patient's access for treatment and lifetime cumulative dose history, if applicable  The patient's medication allergies and previous infusion related reactions, if applicable   Changes made to treatment plan:  N/A  Follow up needed:  N/A   Joshua Harmon, RPH, 12/17/2024  12:04 PM

## 2024-12-18 ENCOUNTER — Inpatient Hospital Stay: Admitting: Internal Medicine

## 2024-12-18 ENCOUNTER — Inpatient Hospital Stay

## 2024-12-24 ENCOUNTER — Inpatient Hospital Stay

## 2024-12-24 ENCOUNTER — Inpatient Hospital Stay: Admitting: Internal Medicine

## 2024-12-25 ENCOUNTER — Other Ambulatory Visit: Payer: Self-pay

## 2024-12-25 ENCOUNTER — Inpatient Hospital Stay

## 2024-12-25 ENCOUNTER — Inpatient Hospital Stay: Admitting: Internal Medicine

## 2024-12-26 ENCOUNTER — Telehealth: Payer: Self-pay | Admitting: Internal Medicine

## 2024-12-26 NOTE — Telephone Encounter (Signed)
 Scheduled patient for 2/12 treatment, called and left the wife a vm with the information.

## 2025-01-03 ENCOUNTER — Inpatient Hospital Stay: Attending: Internal Medicine

## 2025-01-03 ENCOUNTER — Inpatient Hospital Stay: Admitting: Internal Medicine

## 2025-01-03 ENCOUNTER — Inpatient Hospital Stay

## 2025-01-07 ENCOUNTER — Inpatient Hospital Stay

## 2025-01-07 ENCOUNTER — Inpatient Hospital Stay: Admitting: Internal Medicine

## 2025-01-08 ENCOUNTER — Inpatient Hospital Stay

## 2025-01-08 ENCOUNTER — Inpatient Hospital Stay: Admitting: Internal Medicine

## 2025-01-21 ENCOUNTER — Inpatient Hospital Stay

## 2025-01-21 ENCOUNTER — Inpatient Hospital Stay: Admitting: Internal Medicine

## 2025-01-22 ENCOUNTER — Inpatient Hospital Stay

## 2025-01-22 ENCOUNTER — Inpatient Hospital Stay: Admitting: Internal Medicine
